# Patient Record
Sex: Female | Born: 1950
Health system: Southern US, Community
[De-identification: ages and names within clinical notes are randomized; demographics above are authoritative.]

## PROBLEM LIST (undated history)

## (undated) DIAGNOSIS — M1990 Unspecified osteoarthritis, unspecified site: Secondary | ICD-10-CM

## (undated) DIAGNOSIS — F411 Generalized anxiety disorder: Secondary | ICD-10-CM

## (undated) DIAGNOSIS — R011 Cardiac murmur, unspecified: Secondary | ICD-10-CM

## (undated) DIAGNOSIS — I10 Essential (primary) hypertension: Secondary | ICD-10-CM

## (undated) DIAGNOSIS — I2699 Other pulmonary embolism without acute cor pulmonale: Secondary | ICD-10-CM

## (undated) DIAGNOSIS — E7800 Pure hypercholesterolemia, unspecified: Secondary | ICD-10-CM

## (undated) DIAGNOSIS — G4700 Insomnia, unspecified: Secondary | ICD-10-CM

## (undated) HISTORY — DX: Insomnia, unspecified: G47.00

## (undated) HISTORY — PX: OTHER SURGICAL HISTORY: SHX169

## (undated) HISTORY — DX: Generalized anxiety disorder: F41.1

## (undated) HISTORY — DX: Other pulmonary embolism without acute cor pulmonale: I26.99

## (undated) HISTORY — DX: Unspecified osteoarthritis, unspecified site: M19.90

---

## 2002-06-01 ENCOUNTER — Other Ambulatory Visit: Admission: RE | Admit: 2002-06-01 | Discharge: 2002-06-01 | Payer: Self-pay | Admitting: Family Medicine

## 2002-12-30 ENCOUNTER — Emergency Department (HOSPITAL_COMMUNITY): Admission: AD | Admit: 2002-12-30 | Discharge: 2002-12-30 | Payer: Self-pay | Admitting: Family Medicine

## 2003-01-05 ENCOUNTER — Emergency Department (HOSPITAL_COMMUNITY): Admission: AD | Admit: 2003-01-05 | Discharge: 2003-01-05 | Payer: Self-pay | Admitting: Family Medicine

## 2003-04-26 ENCOUNTER — Emergency Department (HOSPITAL_COMMUNITY): Admission: EM | Admit: 2003-04-26 | Discharge: 2003-04-26 | Payer: Self-pay | Admitting: Family Medicine

## 2003-11-17 ENCOUNTER — Emergency Department (HOSPITAL_COMMUNITY): Admission: EM | Admit: 2003-11-17 | Discharge: 2003-11-17 | Payer: Self-pay | Admitting: Family Medicine

## 2004-02-24 DIAGNOSIS — I10 Essential (primary) hypertension: Secondary | ICD-10-CM | POA: Insufficient documentation

## 2004-02-25 ENCOUNTER — Ambulatory Visit: Payer: Self-pay | Admitting: Family Medicine

## 2004-03-10 ENCOUNTER — Ambulatory Visit: Payer: Self-pay | Admitting: Family Medicine

## 2004-03-10 DIAGNOSIS — F411 Generalized anxiety disorder: Secondary | ICD-10-CM

## 2004-03-10 DIAGNOSIS — F3289 Other specified depressive episodes: Secondary | ICD-10-CM | POA: Insufficient documentation

## 2004-03-10 DIAGNOSIS — F329 Major depressive disorder, single episode, unspecified: Secondary | ICD-10-CM

## 2004-03-10 HISTORY — DX: Generalized anxiety disorder: F41.1

## 2004-03-13 ENCOUNTER — Ambulatory Visit: Payer: Self-pay | Admitting: *Deleted

## 2004-03-22 ENCOUNTER — Ambulatory Visit (HOSPITAL_COMMUNITY): Admission: RE | Admit: 2004-03-22 | Discharge: 2004-03-22 | Payer: Self-pay | Admitting: Family Medicine

## 2004-04-21 ENCOUNTER — Ambulatory Visit: Payer: Self-pay | Admitting: Family Medicine

## 2004-04-21 DIAGNOSIS — E785 Hyperlipidemia, unspecified: Secondary | ICD-10-CM | POA: Insufficient documentation

## 2004-04-26 ENCOUNTER — Ambulatory Visit: Payer: Self-pay | Admitting: Family Medicine

## 2004-04-26 DIAGNOSIS — G43909 Migraine, unspecified, not intractable, without status migrainosus: Secondary | ICD-10-CM | POA: Insufficient documentation

## 2004-04-28 ENCOUNTER — Ambulatory Visit: Payer: Self-pay | Admitting: Family Medicine

## 2004-05-08 ENCOUNTER — Encounter: Admission: RE | Admit: 2004-05-08 | Discharge: 2004-05-08 | Payer: Self-pay | Admitting: Family Medicine

## 2004-06-16 ENCOUNTER — Ambulatory Visit: Payer: Self-pay | Admitting: Family Medicine

## 2004-06-26 ENCOUNTER — Ambulatory Visit: Payer: Self-pay | Admitting: Family Medicine

## 2004-06-28 ENCOUNTER — Ambulatory Visit: Payer: Self-pay | Admitting: Family Medicine

## 2004-07-21 ENCOUNTER — Ambulatory Visit: Payer: Self-pay | Admitting: Family Medicine

## 2004-07-28 ENCOUNTER — Ambulatory Visit: Payer: Self-pay | Admitting: Family Medicine

## 2004-08-24 ENCOUNTER — Ambulatory Visit: Payer: Self-pay | Admitting: Family Medicine

## 2004-08-24 DIAGNOSIS — J301 Allergic rhinitis due to pollen: Secondary | ICD-10-CM | POA: Insufficient documentation

## 2004-09-04 ENCOUNTER — Ambulatory Visit: Payer: Self-pay | Admitting: *Deleted

## 2004-09-07 ENCOUNTER — Ambulatory Visit: Payer: Self-pay | Admitting: Family Medicine

## 2004-10-05 ENCOUNTER — Ambulatory Visit: Payer: Self-pay | Admitting: Family Medicine

## 2004-10-13 ENCOUNTER — Ambulatory Visit: Payer: Self-pay | Admitting: Family Medicine

## 2004-11-17 ENCOUNTER — Ambulatory Visit: Payer: Self-pay | Admitting: Family Medicine

## 2005-01-19 ENCOUNTER — Ambulatory Visit: Payer: Self-pay | Admitting: Family Medicine

## 2005-03-01 ENCOUNTER — Ambulatory Visit: Payer: Self-pay | Admitting: Family Medicine

## 2005-03-29 ENCOUNTER — Ambulatory Visit: Payer: Self-pay | Admitting: Family Medicine

## 2005-03-29 ENCOUNTER — Encounter: Payer: Self-pay | Admitting: Family Medicine

## 2005-05-08 ENCOUNTER — Ambulatory Visit (HOSPITAL_COMMUNITY): Admission: RE | Admit: 2005-05-08 | Discharge: 2005-05-08 | Payer: Self-pay | Admitting: Internal Medicine

## 2005-06-12 ENCOUNTER — Ambulatory Visit: Payer: Self-pay | Admitting: Family Medicine

## 2005-07-18 ENCOUNTER — Ambulatory Visit: Payer: Self-pay | Admitting: Family Medicine

## 2005-07-27 ENCOUNTER — Ambulatory Visit: Payer: Self-pay | Admitting: Family Medicine

## 2005-07-30 ENCOUNTER — Ambulatory Visit: Payer: Self-pay | Admitting: Family Medicine

## 2005-09-14 ENCOUNTER — Ambulatory Visit: Payer: Self-pay | Admitting: Family Medicine

## 2005-09-14 DIAGNOSIS — F1910 Other psychoactive substance abuse, uncomplicated: Secondary | ICD-10-CM

## 2005-10-04 DIAGNOSIS — F609 Personality disorder, unspecified: Secondary | ICD-10-CM | POA: Insufficient documentation

## 2005-10-19 ENCOUNTER — Ambulatory Visit: Payer: Self-pay | Admitting: Family Medicine

## 2005-10-25 ENCOUNTER — Ambulatory Visit: Payer: Self-pay | Admitting: Family Medicine

## 2005-11-14 ENCOUNTER — Ambulatory Visit (HOSPITAL_COMMUNITY): Admission: RE | Admit: 2005-11-14 | Discharge: 2005-11-14 | Payer: Self-pay | Admitting: Family Medicine

## 2006-01-14 ENCOUNTER — Ambulatory Visit: Payer: Self-pay | Admitting: Family Medicine

## 2006-03-21 ENCOUNTER — Ambulatory Visit: Payer: Self-pay | Admitting: Family Medicine

## 2006-04-16 ENCOUNTER — Ambulatory Visit: Payer: Self-pay | Admitting: Family Medicine

## 2006-05-06 ENCOUNTER — Ambulatory Visit: Payer: Self-pay | Admitting: Family Medicine

## 2006-05-06 DIAGNOSIS — R9431 Abnormal electrocardiogram [ECG] [EKG]: Secondary | ICD-10-CM

## 2006-05-09 ENCOUNTER — Ambulatory Visit (HOSPITAL_COMMUNITY): Admission: RE | Admit: 2006-05-09 | Discharge: 2006-05-09 | Payer: Self-pay | Admitting: Internal Medicine

## 2006-07-08 ENCOUNTER — Ambulatory Visit: Payer: Self-pay | Admitting: Family Medicine

## 2006-08-13 ENCOUNTER — Ambulatory Visit: Payer: Self-pay | Admitting: Family Medicine

## 2006-09-09 ENCOUNTER — Ambulatory Visit: Payer: Self-pay | Admitting: Family Medicine

## 2006-09-17 DIAGNOSIS — Z9189 Other specified personal risk factors, not elsewhere classified: Secondary | ICD-10-CM

## 2006-09-17 DIAGNOSIS — Z9889 Other specified postprocedural states: Secondary | ICD-10-CM | POA: Insufficient documentation

## 2006-10-13 ENCOUNTER — Emergency Department (HOSPITAL_COMMUNITY): Admission: EM | Admit: 2006-10-13 | Discharge: 2006-10-13 | Payer: Self-pay | Admitting: Emergency Medicine

## 2006-10-15 ENCOUNTER — Ambulatory Visit: Payer: Self-pay | Admitting: Family Medicine

## 2006-11-27 ENCOUNTER — Encounter (INDEPENDENT_AMBULATORY_CARE_PROVIDER_SITE_OTHER): Payer: Self-pay | Admitting: *Deleted

## 2006-12-19 ENCOUNTER — Telehealth (INDEPENDENT_AMBULATORY_CARE_PROVIDER_SITE_OTHER): Payer: Self-pay | Admitting: *Deleted

## 2006-12-27 ENCOUNTER — Encounter (INDEPENDENT_AMBULATORY_CARE_PROVIDER_SITE_OTHER): Payer: Self-pay | Admitting: *Deleted

## 2007-02-27 ENCOUNTER — Emergency Department (HOSPITAL_COMMUNITY): Admission: EM | Admit: 2007-02-27 | Discharge: 2007-02-27 | Payer: Self-pay | Admitting: Family Medicine

## 2007-03-24 ENCOUNTER — Emergency Department (HOSPITAL_COMMUNITY): Admission: EM | Admit: 2007-03-24 | Discharge: 2007-03-24 | Payer: Self-pay | Admitting: Emergency Medicine

## 2007-04-02 ENCOUNTER — Ambulatory Visit: Payer: Self-pay | Admitting: Family Medicine

## 2007-04-02 DIAGNOSIS — H8309 Labyrinthitis, unspecified ear: Secondary | ICD-10-CM

## 2007-05-07 ENCOUNTER — Telehealth (INDEPENDENT_AMBULATORY_CARE_PROVIDER_SITE_OTHER): Payer: Self-pay | Admitting: *Deleted

## 2007-07-09 ENCOUNTER — Telehealth (INDEPENDENT_AMBULATORY_CARE_PROVIDER_SITE_OTHER): Payer: Self-pay | Admitting: Family Medicine

## 2007-07-28 ENCOUNTER — Ambulatory Visit: Payer: Self-pay | Admitting: Family Medicine

## 2007-07-28 LAB — CONVERTED CEMR LAB
Bilirubin Urine: NEGATIVE
Blood in Urine, dipstick: NEGATIVE
Chlamydia, DNA Probe: NEGATIVE
GC Probe Amp, Genital: NEGATIVE
Glucose, Urine, Semiquant: NEGATIVE
Ketones, urine, test strip: NEGATIVE
Nitrite: NEGATIVE
Pap Smear: NORMAL
Protein, U semiquant: NEGATIVE
Specific Gravity, Urine: 1.01
Urobilinogen, UA: NEGATIVE
WBC Urine, dipstick: NEGATIVE
pH: 6.5

## 2007-07-29 ENCOUNTER — Ambulatory Visit: Payer: Self-pay | Admitting: Family Medicine

## 2007-07-30 ENCOUNTER — Encounter (INDEPENDENT_AMBULATORY_CARE_PROVIDER_SITE_OTHER): Payer: Self-pay | Admitting: Family Medicine

## 2007-08-01 ENCOUNTER — Ambulatory Visit (HOSPITAL_COMMUNITY): Admission: RE | Admit: 2007-08-01 | Discharge: 2007-08-01 | Payer: Self-pay | Admitting: Family Medicine

## 2007-08-08 LAB — CONVERTED CEMR LAB
ALT: 21 units/L (ref 0–35)
AST: 19 units/L (ref 0–37)
Albumin: 4.5 g/dL (ref 3.5–5.2)
Alkaline Phosphatase: 93 units/L (ref 39–117)
BUN: 14 mg/dL (ref 6–23)
Basophils Absolute: 0 10*3/uL (ref 0.0–0.1)
Basophils Relative: 0 % (ref 0–1)
CO2: 24 meq/L (ref 19–32)
Calcium: 9.6 mg/dL (ref 8.4–10.5)
Chloride: 105 meq/L (ref 96–112)
Cholesterol: 206 mg/dL — ABNORMAL HIGH (ref 0–200)
Creatinine, Ser: 0.9 mg/dL (ref 0.40–1.20)
Eosinophils Absolute: 0.2 10*3/uL (ref 0.0–0.7)
Eosinophils Relative: 6 % — ABNORMAL HIGH (ref 0–5)
Glucose, Bld: 110 mg/dL — ABNORMAL HIGH (ref 70–99)
HCT: 41.6 % (ref 36.0–46.0)
HDL: 51 mg/dL (ref >39)
Hemoglobin: 13.4 g/dL (ref 12.0–15.0)
LDL Cholesterol: 126 mg/dL — ABNORMAL HIGH (ref 0–99)
Lymphocytes Relative: 27 % (ref 12–46)
Lymphs Abs: 0.9 10*3/uL (ref 0.7–4.0)
MCHC: 32.2 g/dL (ref 30.0–36.0)
MCV: 87.6 fL (ref 78.0–100.0)
Monocytes Absolute: 0.3 10*3/uL (ref 0.1–1.0)
Monocytes Relative: 8 % (ref 3–12)
Neutro Abs: 2 10*3/uL (ref 1.7–7.7)
Neutrophils Relative %: 59 % (ref 43–77)
Platelets: 252 10*3/uL (ref 150–400)
Potassium: 3.9 meq/L (ref 3.5–5.3)
RBC: 4.75 M/uL (ref 3.87–5.11)
RDW: 13 % (ref 11.5–15.5)
Sodium: 141 meq/L (ref 135–145)
Total Bilirubin: 1.1 mg/dL (ref 0.3–1.2)
Total CHOL/HDL Ratio: 4
Total Protein: 7.8 g/dL (ref 6.0–8.3)
Triglycerides: 143 mg/dL (ref <150)
VLDL: 29 mg/dL (ref 0–40)
WBC: 3.5 10*3/uL — ABNORMAL LOW (ref 4.0–10.5)

## 2007-08-12 ENCOUNTER — Encounter (INDEPENDENT_AMBULATORY_CARE_PROVIDER_SITE_OTHER): Payer: Self-pay | Admitting: Family Medicine

## 2007-08-28 ENCOUNTER — Ambulatory Visit: Payer: Self-pay | Admitting: *Deleted

## 2007-08-28 ENCOUNTER — Ambulatory Visit: Payer: Self-pay | Admitting: Nurse Practitioner

## 2007-08-28 DIAGNOSIS — J329 Chronic sinusitis, unspecified: Secondary | ICD-10-CM | POA: Insufficient documentation

## 2007-08-28 LAB — CONVERTED CEMR LAB
Basophils Absolute: 0 10*3/uL (ref 0.0–0.1)
Basophils Relative: 1 % (ref 0–1)
Eosinophils Absolute: 0.1 10*3/uL (ref 0.0–0.7)
Eosinophils Relative: 3 % (ref 0–5)
HCT: 40.9 % (ref 36.0–46.0)
Hemoglobin: 13.6 g/dL (ref 12.0–15.0)
Lymphocytes Relative: 36 % (ref 12–46)
Lymphs Abs: 1.4 10*3/uL (ref 0.7–4.0)
MCHC: 33.3 g/dL (ref 30.0–36.0)
MCV: 84.9 fL (ref 78.0–100.0)
Monocytes Absolute: 0.4 10*3/uL (ref 0.1–1.0)
Monocytes Relative: 11 % (ref 3–12)
Neutro Abs: 1.8 10*3/uL (ref 1.7–7.7)
Neutrophils Relative %: 48 % (ref 43–77)
Platelets: 250 10*3/uL (ref 150–400)
RBC: 4.82 M/uL (ref 3.87–5.11)
RDW: 13 % (ref 11.5–15.5)
WBC: 3.8 10*3/uL — ABNORMAL LOW (ref 4.0–10.5)

## 2007-09-29 ENCOUNTER — Ambulatory Visit: Payer: Self-pay | Admitting: Family Medicine

## 2007-10-02 ENCOUNTER — Telehealth (INDEPENDENT_AMBULATORY_CARE_PROVIDER_SITE_OTHER): Payer: Self-pay | Admitting: *Deleted

## 2007-10-02 LAB — CONVERTED CEMR LAB
ALT: 28 units/L (ref 0–35)
AST: 28 units/L (ref 0–37)
Albumin: 4.6 g/dL (ref 3.5–5.2)
Alkaline Phosphatase: 90 units/L (ref 39–117)
BUN: 15 mg/dL (ref 6–23)
Basophils Absolute: 0 10*3/uL (ref 0.0–0.1)
Basophils Relative: 1 % (ref 0–1)
CO2: 22 meq/L (ref 19–32)
Calcium: 9.9 mg/dL (ref 8.4–10.5)
Chloride: 106 meq/L (ref 96–112)
Cholesterol: 196 mg/dL (ref 0–200)
Creatinine, Ser: 0.72 mg/dL (ref 0.40–1.20)
Eosinophils Absolute: 0.2 10*3/uL (ref 0.0–0.7)
Eosinophils Relative: 6 % — ABNORMAL HIGH (ref 0–5)
Glucose, Bld: 111 mg/dL — ABNORMAL HIGH (ref 70–99)
HCT: 39.7 % (ref 36.0–46.0)
HDL: 53 mg/dL (ref >39)
Hemoglobin: 13.1 g/dL (ref 12.0–15.0)
Hgb A1c MFr Bld: 5.5 % (ref 4.6–6.1)
LDL Cholesterol: 117 mg/dL — ABNORMAL HIGH (ref 0–99)
Lymphocytes Relative: 28 % (ref 12–46)
Lymphs Abs: 1 10*3/uL (ref 0.7–4.0)
MCHC: 33 g/dL (ref 30.0–36.0)
MCV: 85.4 fL (ref 78.0–100.0)
Monocytes Absolute: 0.3 10*3/uL (ref 0.1–1.0)
Monocytes Relative: 9 % (ref 3–12)
Neutro Abs: 2 10*3/uL (ref 1.7–7.7)
Neutrophils Relative %: 56 % (ref 43–77)
Platelets: 248 10*3/uL (ref 150–400)
Potassium: 4.7 meq/L (ref 3.5–5.3)
RBC: 4.65 M/uL (ref 3.87–5.11)
RDW: 13.6 % (ref 11.5–15.5)
Sodium: 143 meq/L (ref 135–145)
Total Bilirubin: 0.9 mg/dL (ref 0.3–1.2)
Total CHOL/HDL Ratio: 3.7
Total Protein: 7.7 g/dL (ref 6.0–8.3)
Triglycerides: 131 mg/dL (ref <150)
VLDL: 26 mg/dL (ref 0–40)
WBC: 3.5 10*3/uL — ABNORMAL LOW (ref 4.0–10.5)

## 2007-10-17 ENCOUNTER — Ambulatory Visit: Payer: Self-pay | Admitting: Family Medicine

## 2007-10-20 ENCOUNTER — Ambulatory Visit: Payer: Self-pay | Admitting: Family Medicine

## 2007-10-21 ENCOUNTER — Ambulatory Visit: Payer: Self-pay | Admitting: Family Medicine

## 2007-10-21 DIAGNOSIS — H8110 Benign paroxysmal vertigo, unspecified ear: Secondary | ICD-10-CM

## 2007-10-21 DIAGNOSIS — H6120 Impacted cerumen, unspecified ear: Secondary | ICD-10-CM

## 2008-01-31 ENCOUNTER — Emergency Department (HOSPITAL_COMMUNITY): Admission: EM | Admit: 2008-01-31 | Discharge: 2008-01-31 | Payer: Self-pay | Admitting: Emergency Medicine

## 2008-02-02 ENCOUNTER — Telehealth (INDEPENDENT_AMBULATORY_CARE_PROVIDER_SITE_OTHER): Payer: Self-pay | Admitting: *Deleted

## 2008-02-03 ENCOUNTER — Ambulatory Visit: Payer: Self-pay | Admitting: Nurse Practitioner

## 2008-04-20 ENCOUNTER — Ambulatory Visit (HOSPITAL_COMMUNITY): Admission: RE | Admit: 2008-04-20 | Discharge: 2008-04-20 | Payer: Self-pay | Admitting: Family Medicine

## 2008-04-20 ENCOUNTER — Ambulatory Visit: Payer: Self-pay | Admitting: Family Medicine

## 2008-04-20 DIAGNOSIS — M25512 Pain in left shoulder: Secondary | ICD-10-CM | POA: Insufficient documentation

## 2008-04-23 ENCOUNTER — Encounter (INDEPENDENT_AMBULATORY_CARE_PROVIDER_SITE_OTHER): Payer: Self-pay | Admitting: Family Medicine

## 2008-08-06 ENCOUNTER — Ambulatory Visit: Payer: Self-pay | Admitting: Nurse Practitioner

## 2008-08-06 DIAGNOSIS — M940 Chondrocostal junction syndrome [Tietze]: Secondary | ICD-10-CM | POA: Insufficient documentation

## 2008-08-11 ENCOUNTER — Ambulatory Visit (HOSPITAL_COMMUNITY): Admission: RE | Admit: 2008-08-11 | Discharge: 2008-08-11 | Payer: Self-pay | Admitting: Family Medicine

## 2008-08-31 ENCOUNTER — Encounter (INDEPENDENT_AMBULATORY_CARE_PROVIDER_SITE_OTHER): Payer: Self-pay | Admitting: Nurse Practitioner

## 2008-08-31 ENCOUNTER — Ambulatory Visit: Payer: Self-pay | Admitting: Nurse Practitioner

## 2008-08-31 LAB — CONVERTED CEMR LAB
Bilirubin Urine: NEGATIVE
Blood in Urine, dipstick: NEGATIVE
Glucose, Urine, Semiquant: NEGATIVE
KOH Prep: NEGATIVE
Ketones, urine, test strip: NEGATIVE
Nitrite: NEGATIVE
Specific Gravity, Urine: 1.01
Urobilinogen, UA: 1
pH: 7

## 2008-09-01 ENCOUNTER — Encounter (INDEPENDENT_AMBULATORY_CARE_PROVIDER_SITE_OTHER): Payer: Self-pay | Admitting: Nurse Practitioner

## 2008-09-01 LAB — CONVERTED CEMR LAB
ALT: 28 units/L (ref 0–35)
AST: 30 units/L (ref 0–37)
Albumin: 4.2 g/dL (ref 3.5–5.2)
Alkaline Phosphatase: 79 units/L (ref 39–117)
BUN: 11 mg/dL (ref 6–23)
Basophils Absolute: 0 10*3/uL (ref 0.0–0.1)
Basophils Relative: 1 % (ref 0–1)
CO2: 25 meq/L (ref 19–32)
Calcium: 9.3 mg/dL (ref 8.4–10.5)
Chlamydia, DNA Probe: NEGATIVE
Chloride: 107 meq/L (ref 96–112)
Cholesterol: 276 mg/dL — ABNORMAL HIGH (ref 0–200)
Creatinine, Ser: 0.84 mg/dL (ref 0.40–1.20)
Eosinophils Absolute: 0.2 10*3/uL (ref 0.0–0.7)
Eosinophils Relative: 5 % (ref 0–5)
GC Probe Amp, Genital: NEGATIVE
Glucose, Bld: 94 mg/dL (ref 70–99)
HCT: 38.7 % (ref 36.0–46.0)
HDL: 52 mg/dL (ref >39)
Hemoglobin: 13.3 g/dL (ref 12.0–15.0)
LDL Cholesterol: 185 mg/dL — ABNORMAL HIGH (ref 0–99)
Lymphocytes Relative: 26 % (ref 12–46)
Lymphs Abs: 1 10*3/uL (ref 0.7–4.0)
MCHC: 34.4 g/dL (ref 30.0–36.0)
MCV: 84.7 fL (ref 78.0–100.0)
Microalb, Ur: 0.76 mg/dL (ref 0.00–1.89)
Monocytes Absolute: 0.3 10*3/uL (ref 0.1–1.0)
Monocytes Relative: 9 % (ref 3–12)
Neutro Abs: 2.4 10*3/uL (ref 1.7–7.7)
Neutrophils Relative %: 60 % (ref 43–77)
Platelets: 234 10*3/uL (ref 150–400)
Potassium: 4 meq/L (ref 3.5–5.3)
RBC: 4.57 M/uL (ref 3.87–5.11)
RDW: 13.5 % (ref 11.5–15.5)
Sodium: 143 meq/L (ref 135–145)
TSH: 0.815 microintl units/mL (ref 0.350–4.500)
Total Bilirubin: 1 mg/dL (ref 0.3–1.2)
Total CHOL/HDL Ratio: 5.3
Total Protein: 7.4 g/dL (ref 6.0–8.3)
Triglycerides: 194 mg/dL — ABNORMAL HIGH (ref <150)
VLDL: 39 mg/dL (ref 0–40)
WBC: 4 10*3/uL (ref 4.0–10.5)

## 2008-09-20 ENCOUNTER — Ambulatory Visit: Payer: Self-pay | Admitting: Nurse Practitioner

## 2008-10-26 ENCOUNTER — Ambulatory Visit: Payer: Self-pay | Admitting: Internal Medicine

## 2008-11-03 ENCOUNTER — Ambulatory Visit: Payer: Self-pay | Admitting: Nurse Practitioner

## 2008-11-03 LAB — CONVERTED CEMR LAB
Cholesterol, target level: 200 mg/dL
HDL goal, serum: 40 mg/dL
LDL Goal: 130 mg/dL

## 2008-11-10 ENCOUNTER — Ambulatory Visit: Payer: Self-pay | Admitting: Nurse Practitioner

## 2008-11-10 LAB — CONVERTED CEMR LAB
ALT: 30 units/L (ref 0–35)
AST: 31 units/L (ref 0–37)
Albumin: 4.4 g/dL (ref 3.5–5.2)
Alkaline Phosphatase: 96 units/L (ref 39–117)
Bilirubin, Direct: 0.2 mg/dL (ref 0.0–0.3)
Cholesterol: 188 mg/dL (ref 0–200)
HDL: 46 mg/dL (ref >39)
Indirect Bilirubin: 0.9 mg/dL (ref 0.0–0.9)
LDL Cholesterol: 122 mg/dL — ABNORMAL HIGH (ref 0–99)
Total Bilirubin: 1.1 mg/dL (ref 0.3–1.2)
Total CHOL/HDL Ratio: 4.1
Total Protein: 7.6 g/dL (ref 6.0–8.3)
Triglycerides: 98 mg/dL (ref <150)
VLDL: 20 mg/dL (ref 0–40)

## 2008-11-12 ENCOUNTER — Encounter (INDEPENDENT_AMBULATORY_CARE_PROVIDER_SITE_OTHER): Payer: Self-pay | Admitting: Nurse Practitioner

## 2008-12-03 ENCOUNTER — Encounter (INDEPENDENT_AMBULATORY_CARE_PROVIDER_SITE_OTHER): Payer: Self-pay | Admitting: Nurse Practitioner

## 2008-12-08 ENCOUNTER — Telehealth (INDEPENDENT_AMBULATORY_CARE_PROVIDER_SITE_OTHER): Payer: Self-pay | Admitting: Nurse Practitioner

## 2008-12-16 ENCOUNTER — Ambulatory Visit: Payer: Self-pay | Admitting: Nurse Practitioner

## 2008-12-29 ENCOUNTER — Encounter (INDEPENDENT_AMBULATORY_CARE_PROVIDER_SITE_OTHER): Payer: Self-pay | Admitting: Nurse Practitioner

## 2008-12-31 ENCOUNTER — Encounter: Admission: RE | Admit: 2008-12-31 | Discharge: 2009-03-10 | Payer: Self-pay | Admitting: Internal Medicine

## 2008-12-31 ENCOUNTER — Encounter (INDEPENDENT_AMBULATORY_CARE_PROVIDER_SITE_OTHER): Payer: Self-pay | Admitting: Nurse Practitioner

## 2009-01-10 ENCOUNTER — Telehealth (INDEPENDENT_AMBULATORY_CARE_PROVIDER_SITE_OTHER): Payer: Self-pay | Admitting: *Deleted

## 2009-01-11 ENCOUNTER — Encounter (INDEPENDENT_AMBULATORY_CARE_PROVIDER_SITE_OTHER): Payer: Self-pay | Admitting: Nurse Practitioner

## 2009-02-16 ENCOUNTER — Telehealth (INDEPENDENT_AMBULATORY_CARE_PROVIDER_SITE_OTHER): Payer: Self-pay | Admitting: Nurse Practitioner

## 2009-02-21 ENCOUNTER — Encounter (INDEPENDENT_AMBULATORY_CARE_PROVIDER_SITE_OTHER): Payer: Self-pay | Admitting: Nurse Practitioner

## 2009-03-09 ENCOUNTER — Encounter (INDEPENDENT_AMBULATORY_CARE_PROVIDER_SITE_OTHER): Payer: Self-pay | Admitting: Nurse Practitioner

## 2009-03-14 ENCOUNTER — Telehealth (INDEPENDENT_AMBULATORY_CARE_PROVIDER_SITE_OTHER): Payer: Self-pay | Admitting: Nurse Practitioner

## 2009-03-14 DIAGNOSIS — H9319 Tinnitus, unspecified ear: Secondary | ICD-10-CM | POA: Insufficient documentation

## 2009-04-20 ENCOUNTER — Ambulatory Visit: Payer: Self-pay | Admitting: Nurse Practitioner

## 2009-04-21 ENCOUNTER — Telehealth (INDEPENDENT_AMBULATORY_CARE_PROVIDER_SITE_OTHER): Payer: Self-pay | Admitting: *Deleted

## 2009-05-20 ENCOUNTER — Ambulatory Visit: Payer: Self-pay | Admitting: Nurse Practitioner

## 2009-05-20 DIAGNOSIS — M79609 Pain in unspecified limb: Secondary | ICD-10-CM | POA: Insufficient documentation

## 2009-06-16 ENCOUNTER — Ambulatory Visit: Payer: Self-pay | Admitting: Nurse Practitioner

## 2009-06-16 DIAGNOSIS — R10812 Left upper quadrant abdominal tenderness: Secondary | ICD-10-CM

## 2009-06-17 ENCOUNTER — Telehealth (INDEPENDENT_AMBULATORY_CARE_PROVIDER_SITE_OTHER): Payer: Self-pay | Admitting: *Deleted

## 2009-06-22 ENCOUNTER — Telehealth (INDEPENDENT_AMBULATORY_CARE_PROVIDER_SITE_OTHER): Payer: Self-pay | Admitting: Nurse Practitioner

## 2009-08-01 ENCOUNTER — Emergency Department (HOSPITAL_COMMUNITY): Admission: EM | Admit: 2009-08-01 | Discharge: 2009-08-01 | Payer: Self-pay | Admitting: Family Medicine

## 2009-08-15 ENCOUNTER — Ambulatory Visit: Payer: Self-pay | Admitting: Nurse Practitioner

## 2009-08-16 ENCOUNTER — Ambulatory Visit (HOSPITAL_COMMUNITY): Admission: RE | Admit: 2009-08-16 | Discharge: 2009-08-16 | Payer: Self-pay | Admitting: Internal Medicine

## 2009-08-17 ENCOUNTER — Ambulatory Visit: Payer: Self-pay | Admitting: Nurse Practitioner

## 2009-08-31 ENCOUNTER — Ambulatory Visit: Payer: Self-pay | Admitting: Nurse Practitioner

## 2009-08-31 DIAGNOSIS — T148 Other injury of unspecified body region: Secondary | ICD-10-CM

## 2009-08-31 DIAGNOSIS — W57XXXA Bitten or stung by nonvenomous insect and other nonvenomous arthropods, initial encounter: Secondary | ICD-10-CM

## 2009-09-01 ENCOUNTER — Encounter (INDEPENDENT_AMBULATORY_CARE_PROVIDER_SITE_OTHER): Payer: Self-pay | Admitting: Nurse Practitioner

## 2009-12-01 ENCOUNTER — Ambulatory Visit: Payer: Self-pay | Admitting: Nurse Practitioner

## 2009-12-01 DIAGNOSIS — M25569 Pain in unspecified knee: Secondary | ICD-10-CM | POA: Insufficient documentation

## 2009-12-12 ENCOUNTER — Ambulatory Visit: Payer: Self-pay | Admitting: Nurse Practitioner

## 2009-12-15 ENCOUNTER — Encounter (INDEPENDENT_AMBULATORY_CARE_PROVIDER_SITE_OTHER): Payer: Self-pay | Admitting: Nurse Practitioner

## 2009-12-27 ENCOUNTER — Encounter (INDEPENDENT_AMBULATORY_CARE_PROVIDER_SITE_OTHER): Payer: Self-pay | Admitting: Nurse Practitioner

## 2009-12-27 DIAGNOSIS — H905 Unspecified sensorineural hearing loss: Secondary | ICD-10-CM | POA: Insufficient documentation

## 2010-01-10 ENCOUNTER — Encounter (INDEPENDENT_AMBULATORY_CARE_PROVIDER_SITE_OTHER): Payer: Self-pay | Admitting: Nurse Practitioner

## 2010-01-18 ENCOUNTER — Telehealth (INDEPENDENT_AMBULATORY_CARE_PROVIDER_SITE_OTHER): Payer: Self-pay | Admitting: Nurse Practitioner

## 2010-01-25 ENCOUNTER — Telehealth (INDEPENDENT_AMBULATORY_CARE_PROVIDER_SITE_OTHER): Payer: Self-pay | Admitting: Nurse Practitioner

## 2010-01-25 ENCOUNTER — Ambulatory Visit: Payer: Self-pay | Admitting: Nurse Practitioner

## 2010-02-06 ENCOUNTER — Encounter (INDEPENDENT_AMBULATORY_CARE_PROVIDER_SITE_OTHER): Payer: Self-pay | Admitting: Nurse Practitioner

## 2010-02-06 ENCOUNTER — Emergency Department (HOSPITAL_COMMUNITY)
Admission: EM | Admit: 2010-02-06 | Discharge: 2010-02-06 | Payer: Self-pay | Source: Home / Self Care | Admitting: Emergency Medicine

## 2010-04-02 ENCOUNTER — Encounter: Payer: Self-pay | Admitting: Family Medicine

## 2010-04-04 ENCOUNTER — Emergency Department (HOSPITAL_COMMUNITY)
Admission: EM | Admit: 2010-04-04 | Discharge: 2010-04-04 | Payer: Self-pay | Source: Home / Self Care | Admitting: Family Medicine

## 2010-04-09 LAB — CONVERTED CEMR LAB
ALT: 18 units/L (ref 0–35)
AST: 17 units/L (ref 0–37)
Albumin: 4.5 g/dL (ref 3.5–5.2)
Alkaline Phosphatase: 80 units/L (ref 39–117)
BUN: 11 mg/dL (ref 6–23)
Basophils Absolute: 0 10*3/uL (ref 0.0–0.1)
Basophils Relative: 1 % (ref 0–1)
Bilirubin Urine: NEGATIVE
Blood in Urine, dipstick: NEGATIVE
CO2: 27 meq/L (ref 19–32)
Calcium: 9.9 mg/dL (ref 8.4–10.5)
Chlamydia, DNA Probe: NEGATIVE
Chloride: 101 meq/L (ref 96–112)
Creatinine, Ser: 0.81 mg/dL (ref 0.40–1.20)
Eosinophils Absolute: 0.2 10*3/uL (ref 0.0–0.7)
Eosinophils Relative: 5 % (ref 0–5)
GC Probe Amp, Genital: NEGATIVE
Glucose, Bld: 84 mg/dL (ref 70–99)
Glucose, Urine, Semiquant: NEGATIVE
HCT: 37.7 % (ref 36.0–46.0)
Hemoglobin: 13.1 g/dL (ref 12.0–15.0)
KOH Prep: NEGATIVE
Ketones, urine, test strip: NEGATIVE
Lymphocytes Relative: 32 % (ref 12–46)
Lymphs Abs: 1.4 10*3/uL (ref 0.7–4.0)
MCHC: 34.7 g/dL (ref 30.0–36.0)
MCV: 84.2 fL (ref 78.0–100.0)
Microalb, Ur: 0.68 mg/dL (ref 0.00–1.89)
Monocytes Absolute: 0.4 10*3/uL (ref 0.1–1.0)
Monocytes Relative: 8 % (ref 3–12)
Neutro Abs: 2.3 10*3/uL (ref 1.7–7.7)
Neutrophils Relative %: 53 % (ref 43–77)
Nitrite: NEGATIVE
OCCULT 1: NEGATIVE
Pap Smear: NEGATIVE
Platelets: 261 10*3/uL (ref 150–400)
Potassium: 3.9 meq/L (ref 3.5–5.3)
Protein, U semiquant: NEGATIVE
RBC: 4.48 M/uL (ref 3.87–5.11)
RDW: 13 % (ref 11.5–15.5)
Sodium: 140 meq/L (ref 135–145)
Specific Gravity, Urine: 1.01
TSH: 1.159 microintl units/mL (ref 0.350–4.500)
Total Bilirubin: 1 mg/dL (ref 0.3–1.2)
Total Protein: 7.4 g/dL (ref 6.0–8.3)
Urobilinogen, UA: 0.2
WBC: 4.2 10*3/uL (ref 4.0–10.5)
pH: 6

## 2010-04-11 ENCOUNTER — Telehealth (INDEPENDENT_AMBULATORY_CARE_PROVIDER_SITE_OTHER): Payer: Self-pay | Admitting: Nurse Practitioner

## 2010-04-13 NOTE — Letter (Signed)
Summary: CLINIC NOTE//OTOLARYNGOLOGY  CLINIC NOTE//OTOLARYNGOLOGY   Imported By: Arta Bruce 01/12/2010 15:00:24  _____________________________________________________________________  External Attachment:    Type:   Image     Comment:   External Document

## 2010-04-13 NOTE — Miscellaneous (Signed)
Summary: New Dx added  Clinical Lists Changes  Problems: Added new problem of HEARING LOSS, SENSORINEURAL, BILATERAL (ICD-389.10) - Audiology evaluation done at [facility] on 12/15/2009 - rec binaural amplification

## 2010-04-13 NOTE — Letter (Signed)
Summary: AUDIOLOGY EVAL  AUDIOLOGY EVAL   Imported By: Arta Bruce 12/21/2009 14:31:22  _____________________________________________________________________  External Attachment:    Type:   Image     Comment:   External Document

## 2010-04-13 NOTE — Letter (Signed)
Summary: *HSN Results Follow up  [facility]  [address] [place], [place] [address]   Phone: [phone]  Fax: [phone]      09/01/2009   BERNARDA ERCK [address] [place], [place]  [address]   Dear  Ms. Devani Muscato,                            ____S.Drinkard,FNP   ____D. Gore,FNP       ____B. McPherson,MD   ____V. Rankins,MD    ____E. Mulberry,MD    __X__N. Daphine Deutscher, FNP  ____D. Reche Dixon, MD    ____K. Philipp Deputy, MD    ____Other     This letter is to inform you that your recent test(s):  __X_____Pap Smear    ___X____Lab Test     _______X-ray    ___X____ is within acceptable limits  _______ requires a medication change  _______ requires a follow-up lab visit  _______ requires a follow-up visit with your provider   Comments: Labs done during recent office visit are normal.  Pap Smear results _______________________________.       _________________________________________________________ If you have any questions, please contact our office [phone].                    Sincerely,    Lehman Prom FNP [facility]

## 2010-04-13 NOTE — Progress Notes (Signed)
Summary: ENT Referral follow up  Phone Note From Other Clinic   Caller: Referral Coordinator Summary of Call: Pt was supposed to get in contact with [facility] @ [facility] before an appt was sched because of the huge $$$$..Pt was to get back in contact with me to let me know if she qualified for assistance. Initial call taken by: Candi Leash,  April 21, 2009 1:00 PM  Follow-up for Phone Call        I'm assuming there are no local ENT's accepting HS pts.  I remember some pts going to ENT across from the [facility] but don't know if they require a co-payment or those circumstances Follow-up by: Lehman Prom FNP,  April 21, 2009 1:05 PM  Additional Follow-up for Phone Call Additional follow up Details #1::        Yes [facility] requires 100.00 up front and an payment plan is made.Pt is aware of this and was asked to be sent somewhere less costly.    Additional Follow-up for Phone Call Additional follow up Details #2::    Steward Drone, contact the pt and inform her of the orginal message from [place].  It appears she needs to see if she qualifies for [facility] n.martin,fnp April 21, 2009  5:52 PM  LMOM>. Chantel Miller  April 28, 2009 12:41 PM  pt given information and phone numbers to contact [facility] and [facility]. Follow-up by: Michelle Nasuti,  April 29, 2009 8:59 AM

## 2010-04-13 NOTE — Progress Notes (Signed)
Summary: [facility] update  Phone Note From Other Clinic   Caller: Referral Coordinator Summary of Call: Pt  has been sched @ [facility]  on  07-26-09 @ 9:15am for Vestibular, @ 11:30am for Hearing & ENT on  08-15-09 @ 1pm. Pt should be able to contact [facility] @ [phone].I lt message for Pt to call me back for appt infor. Initial call taken by: Candi Leash,  June 22, 2009 10:56 AM  Follow-up for Phone Call        noted Follow-up by: Lehman Prom FNP,  June 22, 2009 1:15 PM

## 2010-04-13 NOTE — Progress Notes (Signed)
Summary: Wants to discuss hearing test results  Phone Note Call from Patient   Summary of Call: Would like to discuss with you the results of her hearing tests and hearing evaluation. Initial call taken by: Dutch Quint RN,  January 25, 2010 2:40 PM  Follow-up for Phone Call        attempted to call home # - busy called work # - she was not there yet If/when she calls back find out what she wants me to know regarding her hearing exam. i will not have time during the day to return calls Follow-up by: Lehman Prom FNP,  January 27, 2010 8:04 AM  Additional Follow-up for Phone Call Additional follow up Details #1::        no answer Gaylyn Cheers RN  January 27, 2010 3:45 PM  How the test turned out and the results of the exam itself.  No other specifics given.  Dutch Quint RN  January 31, 2010 11:59 AM     Additional Follow-up for Phone Call Additional follow up Details #2::    attempted to call pt again - no anwer unable to leave a message - phone just keeps ringing will send a letter notifying pt of my attempt  Follow-up by: Lehman Prom FNP,  February 06, 2010 8:37 AM

## 2010-04-13 NOTE — Assessment & Plan Note (Signed)
Summary: LUQ tenderness   Vital Signs:  Patient profile:   60 year old female Menstrual status:  postmenopausal Weight:      182 pounds BMI:     30.87 Temp:     98.4 degrees F Pulse rate:   85 / minute Pulse rhythm:   regular Resp:     20 per minute BP sitting:   133 / 85  (left arm) Cuff size:   regular  Vitals Entered By: Vesta Mixer CMA (June 16, 2009 2:20 PM) CC: Sore under left breast and has a knot under it also.  First noticed about 3 weeks ago.  Last mammogram around May of last year., Hypertension Management Is Patient Diabetic? No Pain Assessment Patient in pain? yes     Location: under left breast Intensity: 7  Does patient need assistance? Ambulation Normal   Primary Care Provider:  Lehman Prom FNP  CC:  Sore under left breast and has a knot under it also.  First noticed about 3 weeks ago.  Last mammogram around May of last year. and Hypertension Management.  History of Present Illness:  Pt into the office with complaint of left breast lump noticed about 3 week ago. ? 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  Pt has been to the physical therapy without resolution of symptoms.   She is continuing to take her allergy medications.   Hypertension History:      She denies headache, chest pain, and palpitations.  She notes no problems with any antihypertensive medication side effects.        Positive major cardiovascular risk factors include female age 97 years old or older, hyperlipidemia, and hypertension.  Negative major cardiovascular risk factors include no history of diabetes and non-tobacco-user status.        Further assessment for target organ damage reveals no history of ASHD, cardiac end-organ damage (CHF/LVH), stroke/TIA, peripheral vascular disease, renal  insufficiency, or hypertensive retinopathy.     Current Medications (verified): 1)  Micardis Hct 80-25 Mg  Tabs (Telmisartan-Hctz) .... One Tablet By Mouth Daily For Blood Pressure 2)  Asa 81 .Marland Kitchen.. 1 By Mouth Qday. 3)  Nasacort Aq 55 Mcg/act  Aers (Triamcinolone Acetonide(Nasal)) .Marland Kitchen.. 1 Spray in Each Nostril Two Times A Daily 4)  Flexeril 10 Mg  Tabs (Cyclobenzaprine Hcl) .Marland Kitchen.. 1 By Mouth Q8hrs As Needed Muscle Spasm. 5)  Crestor 20 Mg Tabs (Rosuvastatin Calcium) .... One Tablet By Mouth Nightly For Cholesterol 6)  Metoprolol Tartrate 25 Mg Tabs (Metoprolol Tartrate) .... One Tablet By Mouth Two Times A Day For Blood Pressure **note Change in Dose** 7)  Naproxen 500 Mg Tabs (Naproxen) .... Take One Tablet Every 12 Hours As Needed For Headache. Take With Food. 8)  Aciphex 20 Mg Tbec (Rabeprazole Sodium) .... Take 1 Tablet By Mouth Once A Day 9)  Allegra 180 Mg Tabs (Fexofenadine Hcl) .... One Tablet By Mouth Daily For Allergies **pharmacy - D/c Lorantadine** 10)  Meclizine Hcl 25 Mg Tabs (Meclizine Hcl) .... One Tablet By Mouth Daily As Needed For Dizziness 11)  Trazodone Hcl 50 Mg Tabs (Trazodone Hcl) .Marland Kitchen.. 1 Tablet By Mouth Nightly As Needed For Sleep 12)  Promethazine Hcl 25 Mg Tabs (Promethazine Hcl) .... One Tablet By Mouth Daily As Needed For Nausea  Allergies (verified): 1)  !  Septra  Review of Systems General:  Denies loss of appetite; + dizziness. CV:  Denies chest pain or discomfort and difficulty breathing at night. Resp:  Denies cough. GI:  Complains of abdominal pain; denies constipation, nausea, and vomiting. MS:  Complains of joint pain; right hand stiffness.  Physical Exam  General:  alert.   Head:  normocephalic.   Abdomen:  left upper abdomen - tenderness with palpation,  inflammation Msk:  normal ROM.   Neurologic:  alert & oriented X3.   Skin:  color normal.   Psych:  Oriented X3.     Impression & Recommendations:  Problem # 1:  ABDOMINAL TENDERNESS, LEFT  UPPER QUADRANT (ICD-789.62) area is actually not in her breast  it is in the left upper quadrant - apply heat to area and continue to monitor no nausea, vomiting or constipation  Problem # 2:  VERTIGO (ICD-780.4) Pt is supposed to be getting ENT referral Her updated medication list for this problem includes:    Allegra 180 Mg Tabs (Fexofenadine hcl) ..... One tablet by mouth daily for allergies **pharmacy - d/c lorantadine**    Meclizine Hcl 25 Mg Tabs (Meclizine hcl) ..... One tablet by mouth daily as needed for dizziness    Promethazine Hcl 25 Mg Tabs (Promethazine hcl) ..... One tablet by mouth daily as needed for nausea  Complete Medication List: 1)  Micardis Hct 80-25 Mg Tabs (Telmisartan-hctz) .... One tablet by mouth daily for blood pressure 2)  Asa 81  .Marland Kitchen.. 1 by mouth qday. 3)  Nasacort Aq 55 Mcg/act Aers (Triamcinolone acetonide(nasal)) .Marland Kitchen.. 1 spray in each nostril two times a daily 4)  Flexeril 10 Mg Tabs (Cyclobenzaprine hcl) .Marland Kitchen.. 1 by mouth q8hrs as needed muscle spasm. 5)  Crestor 20 Mg Tabs (Rosuvastatin calcium) .... One tablet by mouth nightly for cholesterol 6)  Metoprolol Tartrate 25 Mg Tabs (Metoprolol tartrate) .... One tablet by mouth two times a day for blood pressure **note change in dose** 7)  Naproxen 500 Mg Tabs (Naproxen) .... Take one tablet every 12 hours as needed for headache. take with food. 8)  Aciphex 20 Mg Tbec (Rabeprazole sodium) .... Take 1 tablet by mouth once a day 9)  Allegra 180 Mg Tabs (Fexofenadine hcl) .... One tablet by mouth daily for allergies **pharmacy - d/c lorantadine** 10)  Meclizine Hcl 25 Mg Tabs (Meclizine hcl) .... One tablet by mouth daily as needed for dizziness 11)  Trazodone Hcl 50 Mg Tabs (Trazodone hcl) .Marland Kitchen.. 1 tablet by mouth nightly as needed for sleep 12)  Promethazine Hcl 25 Mg Tabs (Promethazine hcl) .... One tablet by mouth daily as needed for nausea  Hypertension Assessment/Plan:      The patient's hypertensive risk group  is category B: At least one risk factor (excluding diabetes) with no target organ damage.  Her calculated 10 year risk of coronary heart disease is 11 %.  Today's blood pressure is 133/85.  Her blood pressure goal is < 140/90.  Patient Instructions: 1)  Try to call and schedule your appointment at [facility] for ENT referral. 2)  Left upper abdomen - apply warm compress to left upper abdomen 3)  Hands - use tylenol arthritis for hands 4)  Follow up as needed - if symptoms in stomach persist for the next 2 weeks then come back to this office. you may need additional testing

## 2010-04-13 NOTE — Assessment & Plan Note (Signed)
Summary: FU FOR BP CHECK///KT  Nurse Visit   Vital Signs:  Patient profile:   60 year old female Menstrual status:  postmenopausal Pulse rate:   80 / minute Pulse rhythm:   regular Resp:     20 per minute BP sitting:   136 / 82  (left arm)  Patient Instructions: 1)  Seen by Wende Mott 2)  Your left ear had some hard wax which we flushed -- your ear drum looks clear.  Your right ear was normal. 3)  Keep your appointment with the ENT doctor on Thursday -- your ear should stay clear until then 4)  Don't ever put anything in your ear -- if you have problems with wax, call us. 5)  Call us if anything changes or if you have any questions.   Impression & Recommendations:  Problem # 1:  CERUMEN IMPACTION, BILATERAL (ICD-380.4)  Right ear was normal Left ear had small amount soft cerumen removed with ear curette Small amount hard cerumen flushed out, TM clearly visible Has appt. with ENT on Thursday  Orders: Cerumen Impaction Removal (25956)  Complete Medication List: 1)  Micardis Hct 80-25 Mg Tabs (Telmisartan-hctz) .... One tablet by mouth daily for blood pressure 2)  Asa 81  .Marland Kitchen.. 1 by mouth qday. 3)  Nasacort Aq 55 Mcg/act Aers (Triamcinolone acetonide(nasal)) .Marland Kitchen.. 1 spray in each nostril two times a daily 4)  Flexeril 10 Mg Tabs (Cyclobenzaprine hcl) .Marland Kitchen.. 1 by mouth q8hrs as needed muscle spasm. 5)  Crestor 20 Mg Tabs (Rosuvastatin calcium) .... One tablet by mouth nightly for cholesterol 6)  Metoprolol Tartrate 25 Mg Tabs (Metoprolol tartrate) .... One tablet by mouth two times a day for blood pressure **note change in dose** 7)  Aciphex 20 Mg Tbec (Rabeprazole sodium) .... Take 1 tablet by mouth once a day 8)  Allegra 180 Mg Tabs (Fexofenadine hcl) .... One tablet by mouth daily for allergies **pharmacy - d/c lorantadine** 9)  Meclizine Hcl 25 Mg Tabs (Meclizine hcl) .... One tablet by mouth daily as needed for dizziness 10)  Trazodone Hcl 50 Mg Tabs (Trazodone hcl) .Marland Kitchen.. 1  tablet by mouth nightly as needed for sleep 11)  Promethazine Hcl 25 Mg Tabs (Promethazine hcl) .... One tablet by mouth daily as needed for nausea 12)  Triamcinolone Acetonide 0.1 % Crea (Triamcinolone acetonide) .... One applicaton topically two times a day to affected area for itching 13)  Hydroxyzine Hcl 25 Mg Tabs (Hydroxyzine hcl) .... One tablet by mouth daily as needed for itching 14)  Diclofenac Sodium 75 Mg Tbec (Diclofenac sodium) .... One tablet by mouth two times a day for knee pain   Review of Systems ENT:  Complains of decreased hearing, earache, hoarseness, nasal congestion, nosebleeds, ringing in ears, and sinus pressure; denies difficulty swallowing, ear discharge, and sore throat; sometimes right ear aches, sometimes hoarse. Uses nasal spray for nasal congestion, works well.. CV:  Complains of fatigue, lightheadness, and shortness of breath with exertion; denies bluish discoloration of lips or nails, chest pain or discomfort, difficulty breathing at night, difficulty breathing while lying down, fainting, leg cramps with exertion, near fainting, palpitations, swelling of feet, swelling of hands, and weight gain.   Physical Exam  Ears:  some cerumen still present.  Right ear has small amount light brown, left ear has dark brown near TM.   Primary Care Provider:  Lehman Prom FNP  CC:  BP check and ear inspection.  History of Present Illness: On 12/01/09 pt. came to  clinic for ear irrigation due to cerumen impaction.  BP at that time was 150/90.  No problems since ears were irrigated.  Took all her meds this morning.  CC: BP check and ear inspection   Allergies: 1)  ! Septra  Orders Added: 1)  Est. Patient Level II [[address]] 2)  Cerumen Impaction Removal [60454]

## 2010-04-13 NOTE — Letter (Signed)
Summary: AUDIOLOGY EVAL//[facility]  AUDIOLOGY EVAL// [facility]   Imported By: Arta Bruce 12/28/2009 11:42:28  _____________________________________________________________________  External Attachment:    Type:   Image     Comment:   External Document

## 2010-04-13 NOTE — Letter (Signed)
Summary: Work Excuse  [facility]  [address] [place], [place] [address]   Phone: [phone]  Fax: [phone]    Today's Date: June 16, 2009  Name of Patient: Victoria Campos  The above named patient had a medical visit today   Please take this into consideration when reviewing the time away from work  Special Instructions:  [  ] None  [ X ] To be off the remainder of today, returning to the normal work / school schedule tomorrow.  [  ] To be off until the next scheduled appointment on ______________________.  [  ] Other ________________________________________________________________ ________________________________________________________________________   Sincerely yours,   Lehman Prom FNP [facility]

## 2010-04-13 NOTE — Assessment & Plan Note (Signed)
Summary: flu shot////kt  Nurse Visit   Allergies: 1)  ! Septra  Immunizations Administered:  Influenza Vaccine # 1:    Vaccine Type: Fluvax 3+    Site: right deltoid    Mfr: [employer]    Dose: 0.5 ml    Route: IM    Given by: Dutch Quint RN    Exp. Date: 09/09/2010    Lot #: [id number]    VIS given: 10/04/09 version given January 25, 2010.  Flu Vaccine Consent Questions:    Do you have a history of severe allergic reactions to this vaccine? no    Any prior history of allergic reactions to egg and/or gelatin? no    Do you have a sensitivity to the preservative Thimersol? no    Do you have a past history of Guillan-Barre Syndrome? no    Do you currently have an acute febrile illness? no    Have you ever had a severe reaction to latex? no    Vaccine information given and explained to patient? yes    Are you currently pregnant? no  Orders Added: 1)  Flu Vaccine 13yrs + [90658] 2)  Admin 1st Vaccine [90471] 3)  Est. Patient Nurse visit [09003]

## 2010-04-13 NOTE — Assessment & Plan Note (Signed)
Summary: HTN/Cerumen Impaction   Vital Signs:  Patient profile:   60 year old female Menstrual status:  postmenopausal Weight:      182.2 pounds Temp:     98.3 degrees F oral Pulse rate:   72 / minute Pulse rhythm:   regular Resp:     20 per minute BP sitting:   150 / 90  (left arm) Cuff size:   regular  Vitals Entered By: Levon Hedger (December 01, 2009 2:23 PM) CC: follow-up visit, Hypertension Management, Lipid Management, Headache, Depression Is Patient Diabetic? No Pain Assessment Patient in pain? no       Does patient need assistance? Functional Status Self care Ambulation Normal Comments pt did not bring medication today   Primary Care Provider:  Lehman Prom FNP  CC:  follow-up visit, Hypertension Management, Lipid Management, Headache, and Depression.  History of Present Illness:   Pt into the office for f/u.  Tinnitis - pt has an appt with [facility] on October 6th.  Pt still has ringing in the ears and dizziness.  Pt did not bring medications with her to the office today.  Advised pt to bring all meds to every visit. The patient denies that she feels like life is not worth living, denies that she wishes that she were dead, and denies that she has thought about ending her life.         Hypertension History:      She denies headache, chest pain, and palpitations.        Positive major cardiovascular risk factors include female age 32 years old or older, hyperlipidemia, and hypertension.  Negative major cardiovascular risk factors include no history of diabetes and non-tobacco-user status.        Further assessment for target organ damage reveals no history of ASHD, cardiac end-organ damage (CHF/LVH), stroke/TIA, peripheral vascular disease, renal insufficiency, or hypertensive retinopathy.    Lipid Management History:      Positive NCEP/ATP III risk factors include female age 56 years old or older and hypertension.  Negative NCEP/ATP III risk factors  include no history of early menopause without estrogen hormone replacement, non-diabetic, non-tobacco-user status, no ASHD (atherosclerotic heart disease), no prior stroke/TIA, no peripheral vascular disease, and no history of aortic aneurysm.        The patient states that she does not know about the "Therapeutic Lifestyle Change" diet.  The patient does not know about adjunctive measures for cholesterol lowering.  She expresses no side effects from her lipid-lowering medication.  The patient denies any symptoms to suggest myopathy or liver disease.        Habits & Providers  Alcohol-Tobacco-Diet     Alcohol drinks/day: 0     Tobacco Status: quit  Exercise-Depression-Behavior     Have you felt down or hopeless? no     Have you felt little pleasure in things? no     Depression Counseling: not indicated; screening negative for depression     Drug Use: no     Seat Belt Use: 100     Sun Exposure: occasionally  Current Medications (verified): 1)  Micardis Hct 80-25 Mg  Tabs (Telmisartan-Hctz) .... One Tablet By Mouth Daily For Blood Pressure 2)  Asa 81 .Marland Kitchen.. 1 By Mouth Qday. 3)  Nasacort Aq 55 Mcg/act  Aers (Triamcinolone Acetonide(Nasal)) .Marland Kitchen.. 1 Spray in Each Nostril Two Times A Daily 4)  Flexeril 10 Mg  Tabs (Cyclobenzaprine Hcl) .Marland Kitchen.. 1 By Mouth Q8hrs As Needed Muscle Spasm. 5)  Crestor  20 Mg Tabs (Rosuvastatin Calcium) .... One Tablet By Mouth Nightly For Cholesterol 6)  Metoprolol Tartrate 25 Mg Tabs (Metoprolol Tartrate) .... One Tablet By Mouth Two Times A Day For Blood Pressure **note Change in Dose** 7)  Naproxen 500 Mg Tabs (Naproxen) .... Take One Tablet Every 12 Hours As Needed For Headache. Take With Food. 8)  Aciphex 20 Mg Tbec (Rabeprazole Sodium) .... Take 1 Tablet By Mouth Once A Day 9)  Allegra 180 Mg Tabs (Fexofenadine Hcl) .... One Tablet By Mouth Daily For Allergies **pharmacy - D/c Lorantadine** 10)  Meclizine Hcl 25 Mg Tabs (Meclizine Hcl) .... One Tablet By Mouth  Daily As Needed For Dizziness 11)  Trazodone Hcl 50 Mg Tabs (Trazodone Hcl) .Marland Kitchen.. 1 Tablet By Mouth Nightly As Needed For Sleep 12)  Promethazine Hcl 25 Mg Tabs (Promethazine Hcl) .... One Tablet By Mouth Daily As Needed For Nausea 13)  Triamcinolone Acetonide 0.1 % Crea (Triamcinolone Acetonide) .... One Applicaton Topically Two Times A Day To Affected Area For Itching 14)  Hydroxyzine Hcl 25 Mg Tabs (Hydroxyzine Hcl) .... One Tablet By Mouth Daily As Needed For Itching  Allergies: 1)  ! Septra  Review of Systems ENT:  Denies ringing in ears. CV:  Denies chest pain or discomfort. Resp:  Denies cough. GI:  Denies abdominal pain, nausea, and vomiting. MS:  Complains of joint pain; right knee.  Physical Exam  General:  alert.   Head:  normocephalic.   Ears:  ear piercing(s) noted.   bil ears with moderate cerumen Lungs:  normal breath sounds.   Heart:  normal rate and regular rhythm.   Abdomen:  normal bowel sounds.   Msk:  up to the exam Neurologic:  alert & oriented X3.   Skin:  color normal.   Psych:  Oriented X3.     Impression & Recommendations:  Problem # 1:  TINNITUS (ICD-388.30) pt to keep the appt at [facility]  Problem # 2:  HYPERTENSION, BENIGN ESSENTIAL (ICD-401.1) Blood pressure is elevated today will monitor Her updated medication list for this problem includes:    Micardis Hct 80-25 Mg Tabs (Telmisartan-hctz) ..... One tablet by mouth daily for blood pressure    Metoprolol Tartrate 25 Mg Tabs (Metoprolol tartrate) ..... One tablet by mouth two times a day for blood pressure **note change in dose**  Problem # 3:  HYPERLIPIDEMIA (ICD-272.4) continue current medications Her updated medication list for this problem includes:    Crestor 20 Mg Tabs (Rosuvastatin calcium) ..... One tablet by mouth nightly for cholesterol  Problem # 4:  KNEE PAIN, RIGHT (ICD-719.46)  The following medications were removed from the medication list:    Naproxen 500 Mg Tabs  (Naproxen) .Marland Kitchen... Take one tablet every 12 hours as needed for headache. take with food. Her updated medication list for this problem includes:    Flexeril 10 Mg Tabs (Cyclobenzaprine hcl) .Marland Kitchen... 1 by mouth q8hrs as needed muscle spasm.    Diclofenac Sodium 75 Mg Tbec (Diclofenac sodium) ..... One tablet by mouth two times a day for knee pain  Problem # 5:  CERUMEN IMPACTION, BILATERAL (ICD-380.4) ears irrigated today Orders: Cerumen Impaction Removal (96295)  Complete Medication List: 1)  Micardis Hct 80-25 Mg Tabs (Telmisartan-hctz) .... One tablet by mouth daily for blood pressure 2)  Asa 81  .Marland Kitchen.. 1 by mouth qday. 3)  Nasacort Aq 55 Mcg/act Aers (Triamcinolone acetonide(nasal)) .Marland Kitchen.. 1 spray in each nostril two times a daily 4)  Flexeril 10 Mg Tabs (Cyclobenzaprine  hcl) .... 1 by mouth q8hrs as needed muscle spasm. 5)  Crestor 20 Mg Tabs (Rosuvastatin calcium) .... One tablet by mouth nightly for cholesterol 6)  Metoprolol Tartrate 25 Mg Tabs (Metoprolol tartrate) .... One tablet by mouth two times a day for blood pressure **note change in dose** 7)  Aciphex 20 Mg Tbec (Rabeprazole sodium) .... Take 1 tablet by mouth once a day 8)  Allegra 180 Mg Tabs (Fexofenadine hcl) .... One tablet by mouth daily for allergies **pharmacy - d/c lorantadine** 9)  Meclizine Hcl 25 Mg Tabs (Meclizine hcl) .... One tablet by mouth daily as needed for dizziness 10)  Trazodone Hcl 50 Mg Tabs (Trazodone hcl) .Marland Kitchen.. 1 tablet by mouth nightly as needed for sleep 11)  Promethazine Hcl 25 Mg Tabs (Promethazine hcl) .... One tablet by mouth daily as needed for nausea 12)  Triamcinolone Acetonide 0.1 % Crea (Triamcinolone acetonide) .... One applicaton topically two times a day to affected area for itching 13)  Hydroxyzine Hcl 25 Mg Tabs (Hydroxyzine hcl) .... One tablet by mouth daily as needed for itching 14)  Diclofenac Sodium 75 Mg Tbec (Diclofenac sodium) .... One tablet by mouth two times a day for knee  pain  Hypertension Assessment/Plan:      The patient's hypertensive risk group is category B: At least one risk factor (excluding diabetes) with no target organ damage.  Her calculated 10 year risk of coronary heart disease is 15 %.  Today's blood pressure is 150/90.  Her blood pressure goal is < 140/90.  Lipid Assessment/Plan:      Based on NCEP/ATP III, the patient's risk factor category is "0-1 risk factors".  The patient's lipid goals are as follows: Total cholesterol goal is 200; LDL cholesterol goal is 130; HDL cholesterol goal is 40; Triglyceride goal is 150.    Patient Instructions: 1)  Flu vaccine will be due next year. 2)  Ears will be cleaned today.   3)  Come into the office on October 3rd to let the triage nurse look in your ears. If ok, no charge since we won't need an irrigation Prescriptions: DICLOFENAC SODIUM 75 MG TBEC (DICLOFENAC SODIUM) One tablet by mouth two times a day for knee pain  #60 x 1   Entered and Authorized by:   Lehman Prom FNP   Signed by:   Lehman Prom FNP on 12/01/2009   Method used:   Print then Give to Patient   RxID:   [id number]

## 2010-04-13 NOTE — Letter (Signed)
Summary: STAFF MEDICAL REPORT  STAFF MEDICAL REPORT   Imported By: Arta Bruce 08/18/2009 09:51:13  _____________________________________________________________________  External Attachment:    Type:   Image     Comment:   External Document

## 2010-04-13 NOTE — Progress Notes (Signed)
Summary: COUGH AND BODY ACHING  Phone Note Call from Patient Call back at [phone]- WORK #   Reason for Call: Talk to Nurse Summary of Call: MARTIN PT. SHE HAS A COUGH SHE CAN'T GET RID OF AND SHE IS ACHING. Initial call taken by: Leodis Rains,  January 18, 2010 10:38 AM  Follow-up for Phone Call        Cough started on Sunday, productive of thin/thick yellow mucus.  Denies dyspnea except at night because of the heat.  Uses nasal spray. Denies nasal discharge, no hemoptysis.   Is taking allergy medication, helps a little.  Has light headache. Legs are also aching.   Instructed re Tylenol every 4-6 hours for pain, drink plenty of fluids. Had a fever 99.5 today -- works around children.  Has not had her flu shot -- appt. 01/20/10.  Advised not to get flu vaccine if running a fever and re home management for cold per protocol, to call back if symptoms persist or worsen -- verbalized understanding.  Dutch Quint RN  January 18, 2010 4:58 PM

## 2010-04-13 NOTE — Miscellaneous (Signed)
Summary: Rehab Report//DISCHARGE SUMMARY  Rehab Report//DISCHARGE SUMMARY   Imported By: Arta Bruce 05/11/2009 11:39:08  _____________________________________________________________________  External Attachment:    Type:   Image     Comment:   External Document

## 2010-04-13 NOTE — Assessment & Plan Note (Signed)
Summary: Acute - Vertigo   Vital Signs:  Patient profile:   60 year old female Menstrual status:  postmenopausal Weight:      182.4 pounds BMI:     30.94 BSA:     1.89 Temp:     97.9 degrees F oral Pulse rate:   74 / minute Pulse rhythm:   regular Resp:     16 per minute BP sitting:   164 / 91  (left arm) Cuff size:   regular  Vitals Entered By: Levon Hedger (April 20, 2009 3:38 PM) CC: right ear pain x 1 week and dizziness has returned, Hypertension Management, Lipid Management Is Patient Diabetic? No Pain Assessment Patient in pain? no       Does patient need assistance? Functional Status Self care Ambulation Normal LMP - Character: menopausal     Menstrual Status postmenopausal Last PAP Result  Specimen Adequacy: Satisfactory for evaluation.   Interpretation/Result:Negative for intraepithelial Lesion or Malignancy.      Primary Care Provider:  [facility]  CC:  right ear pain x 1 week and dizziness has returned, Hypertension Management, and Lipid Management.  History of Present Illness:  Pt into the office with complaints of right ear pain that started 1 week ago. Left ear pain started 1 day ago. Previous hx of vestibular abnormality or vertigo.  Pt was seen at Physical therapy and she completed her her course of treatment.  Symptoms did get better following the end of the six weeks course however dizziness has returned despite completing exercises. +nausea  -vomiting +dizziness +bil ear pain R>L  Hypertension History:      She denies headache, chest pain, and palpitations.  She notes no problems with any antihypertensive medication side effects.  Further comments include: dizziness.        Positive major cardiovascular risk factors include female age 25 years old or older, hyperlipidemia, and hypertension.  Negative major cardiovascular risk factors include no history of diabetes and non-tobacco-user status.        Further assessment for target organ  damage reveals no history of ASHD, cardiac end-organ damage (CHF/LVH), stroke/TIA, peripheral vascular disease, renal insufficiency, or hypertensive retinopathy.    Lipid Management History:      Positive NCEP/ATP III risk factors include female age 84 years old or older and hypertension.  Negative NCEP/ATP III risk factors include no history of early menopause without estrogen hormone replacement, non-diabetic, non-tobacco-user status, no ASHD (atherosclerotic heart disease), no prior stroke/TIA, no peripheral vascular disease, and no history of aortic aneurysm.        The patient states that she does not know about the "Therapeutic Lifestyle Change" diet.  The patient does not know about adjunctive measures for cholesterol lowering.  She expresses no side effects from her lipid-lowering medication.  The patient denies any symptoms to suggest myopathy or liver disease.      Allergies (verified): 1)  ! Septra  Review of Systems General:  Complains of sleep disorder; denies fever. ENT:  Complains of earache; denies decreased hearing and nasal congestion. CV:  Denies chest pain or discomfort. Resp:  Denies cough. GI:  Denies abdominal pain, nausea, and vomiting.  Physical Exam  General:  alert.   Eyes:  pupils reactive to light.   Ears:  bil canals with minimal cerumen top of TM visualized. No erythema Lungs:  normal breath sounds.   Heart:  normal rate and regular rhythm.   Abdomen:  normal bowel sounds.   Msk:  up to  the exam table Neurologic:  alert & oriented X3.   Skin:  no rashes.   Psych:  Oriented X3.     Impression & Recommendations:  Problem # 1:  VERTIGO (ICD-780.4) Will refer to ENT current problem is persistent will change loratadine to allegra use nasal spray two times a day  Her updated medication list for this problem includes:    Allegra 180 Mg Tabs (Fexofenadine hcl) ..... One tablet by mouth daily for allergies **pharmacy - d/c lorantadine**    Meclizine Hcl  25 Mg Tabs (Meclizine hcl) ..... One tablet by mouth daily as needed for dizziness    Promethazine Hcl 25 Mg Tabs (Promethazine hcl) ..... One tablet by mouth daily as needed for nausea  Problem # 2:  UNSPECIFIED SLEEP DISTURBANCE (ICD-780.50) will refill trazodone as helps pt rest  Complete Medication List: 1)  Micardis Hct 80-25 Mg Tabs (Telmisartan-hctz) .... One tablet by mouth daily for blood pressure 2)  Asa 81  .Marland Kitchen.. 1 by mouth qday. 3)  Nasacort Aq 55 Mcg/act Aers (Triamcinolone acetonide(nasal)) .Marland Kitchen.. 1 spray in each nostril two times a daily 4)  Flexeril 10 Mg Tabs (Cyclobenzaprine hcl) .Marland Kitchen.. 1 by mouth q8hrs as needed muscle spasm. 5)  Crestor 20 Mg Tabs (Rosuvastatin calcium) .... One tablet by mouth nightly for cholesterol 6)  Metoprolol Tartrate 25 Mg Tabs (Metoprolol tartrate) .... Take 1/2 tablet by mouth 2 times daily 7)  Naproxen 500 Mg Tabs (Naproxen) .... Take one tablet every 12 hours as needed for headache. take with food. 8)  Aciphex 20 Mg Tbec (Rabeprazole sodium) .... Take 1 tablet by mouth once a day 9)  Allegra 180 Mg Tabs (Fexofenadine hcl) .... One tablet by mouth daily for allergies **pharmacy - d/c lorantadine** 10)  Meclizine Hcl 25 Mg Tabs (Meclizine hcl) .... One tablet by mouth daily as needed for dizziness 11)  Trazodone Hcl 50 Mg Tabs (Trazodone hcl) .Marland Kitchen.. 1 tablet by mouth nightly as needed for sleep 12)  Promethazine Hcl 25 Mg Tabs (Promethazine hcl) .... One tablet by mouth daily as needed for nausea  Hypertension Assessment/Plan:      The patient's hypertensive risk group is category B: At least one risk factor (excluding diabetes) with no target organ damage.  Her calculated 10 year risk of coronary heart disease is 17 %.  Today's blood pressure is 164/91.  Her blood pressure goal is < 140/90.  Lipid Assessment/Plan:      Based on NCEP/ATP III, the patient's risk factor category is "2 or more risk factors and a calculated 10 year CAD risk of < 20%".  The  patient's lipid goals are as follows: Total cholesterol goal is 200; LDL cholesterol goal is 130; HDL cholesterol goal is 40; Triglyceride goal is 150.    Patient Instructions: 1)  Use your nasal spray once in the morning and once at night. 2)  Hold your head down. 3)  You will be referred to ENT as your vertigo has continued despite multiple treatments and physical therapy. If you have not received the appointment by next Wednesday, call this office. Prescriptions: TRAZODONE HCL 50 MG TABS (TRAZODONE HCL) 1 tablet by mouth nightly as needed for sleep  #30 x 0   Entered and Authorized by:   Lehman Prom FNP   Signed by:   Lehman Prom FNP on 04/20/2009   Method used:   Faxed to ...       [facility] (retail)  [address] [place], [place]  [address]       Ph: [phone]       Fax: [phone]   RxID:   [id number] ALLEGRA 180 MG TABS (FEXOFENADINE HCL) One tablet by mouth daily for allergies **Pharmacy - D/C lorantadine**  #30 x 5   Entered and Authorized by:   Lehman Prom FNP   Signed by:   Lehman Prom FNP on 04/20/2009   Method used:   Faxed to ...       [facility] (retail)       [address] [place], [place]  [address]       Ph: [phone]       Fax: [phone]   RxID:   [id number] TRAZODONE HCL 50 MG TABS (TRAZODONE HCL) 1 tablet by mouth nightly as needed for sleep  #30 x 0   Entered and Authorized by:   Lehman Prom FNP   Signed by:   Lehman Prom FNP on 04/20/2009   Method used:   Print then Give to Patient   RxID:   [id number]

## 2010-04-13 NOTE — Letter (Signed)
Summary: Generic Letter  [facility]  [address] [place], [place] [address]   Phone: [phone]  Fax: [phone]    02/06/2010  Victoria Campos [address] [place], [place]  [address]  Dear Ms. Raul Del,  I attempted to call you twice at home regarding your request for me to contact you about your hearing results.  Phone just keeps ringing and does not allow me to leave a message.  I also called your work number but I was told that you were not available.    I hope you were able to go to [facility] and be assessed for a hearing device should you decide that is what you want to do to restore your hearing.   Notify this office if there is anything else we can do for you.   Sincerely,   Lehman Prom FNP [facility]

## 2010-04-13 NOTE — Progress Notes (Signed)
Summary: WE NEED TO CALL [facility]  Phone Note Call from Patient Call back at [phone]   Summary of Call: MARTIN PT. MS Bond CALLED TO LET YOU KNOW THAT SHE CALLED [facility], AND WAS TOLD THAT YOU WOULD NEED TO CALL THEM SO SHE CAN MAKE THE APPT. AND [facility] WILL SEND HER DOWN TO FINANCE DEPT. Initial call taken by: Leodis Rains,  June 17, 2009 11:35 AM  Follow-up for Phone Call        pt states it was for the ENT referral... pt states that she was told to call [facility] per Stanton Kidney to see about financial assistance she did speak with a counselor but she was advised that she needed an appt first in order for their office to let her know what her discount would be. Will forward phone note to Stanton Kidney to move forward with referral and contact patient once all is set then patient states she will call back to financial assistance once completed. Follow-up by: Mikey College CMA,  June 21, 2009 11:33 AM  Additional Follow-up for Phone Call Additional follow up Details #1::        See phone note dated 06-22-09 Additional Follow-up by: Candi Leash,  June 23, 2009 8:38 AM

## 2010-04-13 NOTE — Progress Notes (Signed)
Summary: SOUND IN EAR GETTING WORSE  Phone Note Call from Patient Call back at [facility] Phone [phone] Call back at [phone]-HER CELL   Summary of Call: MARTIN PT. MS Fleece CALLED AND SAYS THAT SHE FINISHED HER THERAPY FOR HER HEARING ON LAST WEEK. AND SHE WAS TOLD THE THE THERAPSIT TO CALL THE OFFICE BECAUSE THE SOUND (NOISE) IN HER EAR IS GETTING LOUDER AND WORSE. CHRISTINE THE THE THERAPIST IS SUPOSE TO BE SENDING OVER HER OFFICE VISITS. Initial call taken by: Leodis Rains,  March 14, 2009 2:57 PM  Follow-up for Phone Call        forward to N. Daphine Deutscher, FNP Follow-up by: Levon Hedger,  March 14, 2009 5:25 PM  Additional Follow-up for Phone Call Additional follow up Details #1::        will refer to ENT. referral in basket notify pt and inform her of referral date/time Additional Follow-up by: Lehman Prom FNP,  March 14, 2009 5:34 PM  New Problems: TINNITUS (ICD-388.30)   Additional Follow-up for Phone Call Additional follow up Details #2::    left message on answer machine for pt. to return call, referral information up front [facility] RN  March 15, 2009 1:01 PM    referral faxed to [facility] and message left for pt to notify her that we are completing a referral....Marland KitchenMarland KitchenMikey College CMA  March 17, 2009 12:31 PM   Additional Follow-up for Phone Call Additional follow up Details #3:: Details for Additional Follow-up Action Taken: MS Sawatzky RETURNED CALL, AND SHE IS AWARE THAT HER REFERRAL IS BEING WORKED ON FOR ENT DR, AND SHE WIILL BE NOTIFIED.Cala Bradford Tinnin  March 18, 2009 8:22 AM   New Problems: TINNITUS (ICD-388.30)

## 2010-04-13 NOTE — Assessment & Plan Note (Signed)
Summary: Foot pain/HTN   Vital Signs:  Patient profile:   60 year old female Menstrual status:  postmenopausal Weight:      184.0 pounds BMI:     31.21 BSA:     1.90 Temp:     97.9 degrees F oral Pulse rate:   76 / minute Pulse rhythm:   regular Resp:     20 per minute BP sitting:   171 / 101  (left arm) Cuff size:   regular  Vitals Entered By: Levon Hedger (May 20, 2009 10:52 AM) CC: both feet feels like needles are in them, but it is mainly her left foot x 2 1/2 weeks Is Patient Diabetic? No Pain Assessment Patient in pain? no       Does patient need assistance? Functional Status Self care Ambulation Normal   Primary Care Provider:  Lehman Prom FNP  CC:  both feet feels like needles are in them and but it is mainly her left foot x 2 1/2 weeks.  History of Present Illness:  Pt into the office with complaints of foot pain. Left foot is mainly affected Usually when she wakes in the morning and puts her foot on the floor and she feels the 2nd and 3rd toes tingling. Sometimes on Sunday she wears very low healed shoes. No swelling of the toes.   ENT - pt is aware that she will need to contact [facility] for the specifics on payment.  Pt reports that she has attempted to call that office and has left messages but has not received a return phone call.  Colon Cancer - 4 siblings with colon cancer. Pt has never had a colonscopy.   She is aware that she needs a colonscopy given her risk factors but she is not able to afford the screening test.  Allergies (verified): 1)  ! Septra  Review of Systems General:  Denies fever. CV:  Denies chest pain or discomfort. Resp:  Denies cough. GI:  Denies abdominal pain, nausea, and vomiting. MS:  foot pain.  Physical Exam  General:  alert.   Head:  normocephalic.   Lungs:  normal breath sounds.   Heart:  normal rate and regular rhythm.   Abdomen:  normal bowel sounds.   Msk:  up to the exam  Neurologic:  alert &  oriented X3 and gait normal.   Skin:  color normal.   Psych:  Oriented X3.     Impression & Recommendations:  Problem # 1:  FOOT PAIN (ICD-729.5) advised pt to stretch the ligaments in his foot  Problem # 2:  HYPERTENSION, BENIGN ESSENTIAL (ICD-401.1) Will increase the metoprolol by mouth two times a day  will continue the micardis Her updated medication list for this problem includes:    Micardis Hct 80-25 Mg Tabs (Telmisartan-hctz) ..... One tablet by mouth daily for blood pressure    Metoprolol Tartrate 25 Mg Tabs (Metoprolol tartrate) ..... One tablet by mouth two times a day for blood pressure **note change in dose**  Problem # 3:  TINNITUS (ICD-388.30) advised pt that she needs to get ENT referral  Problem # 4:  NEOPLASM, MALIGNANT, COLON, FAMILY HX (ICD-V16.0) advised pt that she needs colonscopy given strong family history will see if pt can get a referral - she may have to pay some out of pocket cost  Complete Medication List: 1)  Micardis Hct 80-25 Mg Tabs (Telmisartan-hctz) .... One tablet by mouth daily for blood pressure 2)  Asa 81  .Marland KitchenMarland KitchenMarland Kitchen 1  by mouth qday. 3)  Nasacort Aq 55 Mcg/act Aers (Triamcinolone acetonide(nasal)) .Marland Kitchen.. 1 spray in each nostril two times a daily 4)  Flexeril 10 Mg Tabs (Cyclobenzaprine hcl) .Marland Kitchen.. 1 by mouth q8hrs as needed muscle spasm. 5)  Crestor 20 Mg Tabs (Rosuvastatin calcium) .... One tablet by mouth nightly for cholesterol 6)  Metoprolol Tartrate 25 Mg Tabs (Metoprolol tartrate) .... One tablet by mouth two times a day for blood pressure **note change in dose** 7)  Naproxen 500 Mg Tabs (Naproxen) .... Take one tablet every 12 hours as needed for headache. take with food. 8)  Aciphex 20 Mg Tbec (Rabeprazole sodium) .... Take 1 tablet by mouth once a day 9)  Allegra 180 Mg Tabs (Fexofenadine hcl) .... One tablet by mouth daily for allergies **pharmacy - d/c lorantadine** 10)  Meclizine Hcl 25 Mg Tabs (Meclizine hcl) .... One tablet by mouth daily  as needed for dizziness 11)  Trazodone Hcl 50 Mg Tabs (Trazodone hcl) .Marland Kitchen.. 1 tablet by mouth nightly as needed for sleep 12)  Promethazine Hcl 25 Mg Tabs (Promethazine hcl) .... One tablet by mouth daily as needed for nausea  Patient Instructions: 1)  Foot pain - stretch the ligaments in your foot - roll a can of food under your foot back and forth 10 times.  Use a towel and pull forward to stretch the ligaments. 2)  your blood pressure is high.   3)  You will need to increase the metoprolol to two times a day  4)  Continue micardis daily 5)  Keep trying to call ENT at [facility] for your referral.   6)  Follow up as needed Prescriptions: METOPROLOL TARTRATE 25 MG TABS (METOPROLOL TARTRATE) One tablet by mouth two times a day for blood pressure **Note change in dose**  #60 x 5   Entered and Authorized by:   Lehman Prom FNP   Signed by:   Lehman Prom FNP on 05/20/2009   Method used:   Faxed to ...       [facility] (retail)       [address] [place], [place]  [address]       Ph: [phone]       Fax: [phone]   RxID:   [id number]

## 2010-04-13 NOTE — Progress Notes (Signed)
Summary: Office Visit//DEPRESSION SCREENING  Office Visit//DEPRESSION SCREENING   Imported By: Arta Bruce 09/01/2009 10:44:05  _____________________________________________________________________  External Attachment:    Type:   Image     Comment:   External Document

## 2010-04-13 NOTE — Assessment & Plan Note (Signed)
Summary: Complete Physical Exam   Vital Signs:  Patient profile:   60 year old female Menstrual status:  postmenopausal Weight:      185.7 pounds BMI:     31.50 Temp:     97.9 degrees F oral Pulse rate:   76 / minute Pulse rhythm:   regular Resp:     20 per minute BP sitting:   161 / 93  (left arm) Cuff size:   regular  Vitals Entered By: Levon Hedger (August 31, 2009 2:22 PM)  Nutrition Counseling: Patient's BMI is greater than 25 and therefore counseled on weight management options. CC: CPP...rash on arms, side, back, and legs and it itches alot, Hypertension Management, Lipid Management Is Patient Diabetic? No Pain Assessment Patient in pain? no       Does patient need assistance? Functional Status Self care Ambulation Normal   Primary Care Provider:  Lehman Prom FNP  CC:  CPP...rash on arms, side, back, and legs and it itches alot, Hypertension Management, and Lipid Management.  History of Present Illness:  Pt into the office for a complete physical exam  PAP - last done 1 year ago in this office Post menopausal - no menses for the past 4 years 2 children - grown Fiance'  Mammogram - last done 08/16/2009 no family of breast cancer  Optho - wears reading glasses, no recent eye appt  Dental - no recent dental appt - upper partial and lower partial  Colonscopy - Brother and 2 sisters with colon cancer Pt has never had a colonscopy No blood in stool, denies any constipation   Hypertension History:      She denies headache, chest pain, and palpitations.  Further comments include: Pt is taking micardis as ordered according to the pharmacy profile sheet.        Positive major cardiovascular risk factors include female age 41 years old or older, hyperlipidemia, and hypertension.  Negative major cardiovascular risk factors include no history of diabetes and non-tobacco-user status.        Further assessment for target organ damage reveals no history of ASHD,  cardiac end-organ damage (CHF/LVH), stroke/TIA, peripheral vascular disease, renal insufficiency, or hypertensive retinopathy.    Lipid Management History:      Positive NCEP/ATP III risk factors include female age 56 years old or older and hypertension.  Negative NCEP/ATP III risk factors include no history of early menopause without estrogen hormone replacement, non-diabetic, non-tobacco-user status, no ASHD (atherosclerotic heart disease), no prior stroke/TIA, no peripheral vascular disease, and no history of aortic aneurysm.        The patient states that she does not know about the "Therapeutic Lifestyle Change" diet.  The patient does not know about adjunctive measures for cholesterol lowering.  She expresses no side effects from her lipid-lowering medication.  Comments include: pt is taking meds as ordered.  The patient denies any symptoms to suggest myopathy or liver disease.     Habits & Providers  Alcohol-Tobacco-Diet     Alcohol drinks/day: 0     Tobacco Status: quit  Exercise-Depression-Behavior     Have you felt down or hopeless? no     Have you felt little pleasure in things? no     Depression Counseling: not indicated; screening negative for depression     Drug Use: no     Seat Belt Use: 100     Sun Exposure: occasionally  Comments: PHQ-9 score = 5  Allergies (verified): 1)  ! Septra  Review of  Systems Eyes:  Denies blurring. ENT:  Denies earache. CV:  Denies chest pain or discomfort. Resp:  Denies cough. GI:  Denies abdominal pain, bloody stools, constipation, nausea, and vomiting. GU:  Denies discharge. MS:  Denies joint pain. Derm:  Complains of rash; Noticed for about 1 month went to urgent care for rash and was given "pills" but rash persisted rash goes away and then returns no pain only itching. bilateral inner arms and side Admits that fiance who sleeps in the bed with her has similar rash that started at the same time. Neuro:  Denies headaches. Psych:   Denies anxiety and depression.  Physical Exam  General:  alert.   Head:  normocephalic.   Eyes:  pupils round.   Ears:  bil ears with moderate cerumen - unable to visualize the TM Nose:  no nasal discharge.   Mouth:  pharynx pink and moist.   Neck:  supple.   Chest Wall:  no mass.   Breasts:  skin/areolae normal, no masses, and no abnormal thickening.   Lungs:  normal breath sounds.   Heart:  normal rate and regular rhythm.   Abdomen:  soft, non-tender, and normal bowel sounds.   Rectal:  no external abnormalities.   Msk:  up to the exam table Pulses:  R radial normal and L radial normal.   Extremities:  no edema Neurologic:  alert & oriented X3.   Skin:  multiple nevi on face, chest and back bil inner arms, back with papules   Psych:  Oriented X3.    Pelvic Exam  Vulva:      normal appearance.   Urethra and Bladder:      Urethra--normal.  Bladder--normal.   Vagina:      physiologic discharge.   Cervix:      midposition.   Uterus:      smooth.   Adnexa:      nontender bilaterally.   Rectum:      normal, heme negative stool.      Impression & Recommendations:  Problem # 1:  ROUTINE GYNECOLOGICAL EXAMINATION (ICD-V72.31) labs done except lipids as pt is NOT fasting PAP done mammogram up to date tetanus up to date guaiac negative, pt needs colonscopy, aware of reason but is unable to afford - strong family history PHQ-9 score = 5 rec optho and dental exam Orders: Hemoccult Guaiac-1 spec.(in office) (82270) KOH/ WET Mount [phone]) Pap Smear, Thin Prep ( Collection of) (V7846) UA Dipstick w/o Micro (manual) (96295) T-Urine Microalbumin w/creat. ratio [phone]) T-TSH [phone]) T-HIV Antibody  (Reflex) [id number]) T-CBC w/Diff [id number]) T-Comprehensive Metabolic Panel [phone]) T- GC Chlamydia (01093)  Problem # 2:  HYPERTENSION, BENIGN ESSENTIAL (ICD-401.1) Bp is elevated today will continue current meds and recheck on next  visit Her updated medication list for this problem includes:    Micardis Hct 80-25 Mg Tabs (Telmisartan-hctz) ..... One tablet by mouth daily for blood pressure    Metoprolol Tartrate 25 Mg Tabs (Metoprolol tartrate) ..... One tablet by mouth two times a day for blood pressure **note change in dose**  Orders: EKG w/ Interpretation (93000)  Problem # 3:  HYPERLIPIDEMIA (ICD-272.4) unable to check labs today - pt is not fasting Her updated medication list for this problem includes:    Crestor 20 Mg Tabs (Rosuvastatin calcium) ..... One tablet by mouth nightly for cholesterol  Problem # 4:  INSECT BITE (ICD-919.4) ? bed bug bites information given - pt advised to clean home well  Problem # 5:  NEOPLASM,  MALIGNANT, COLON, FAMILY HX (ICD-V16.0)  Orders: Colonoscopy (Colon)  Problem # 6:  CERUMEN IMPACTION, BILATERAL (ICD-380.4)  irrigation done today in office  Orders: Cerumen Impaction Removal (81191)  Complete Medication List: 1)  Micardis Hct 80-25 Mg Tabs (Telmisartan-hctz) .... One tablet by mouth daily for blood pressure 2)  Asa 81  .Marland Kitchen.. 1 by mouth qday. 3)  Nasacort Aq 55 Mcg/act Aers (Triamcinolone acetonide(nasal)) .Marland Kitchen.. 1 spray in each nostril two times a daily 4)  Flexeril 10 Mg Tabs (Cyclobenzaprine hcl) .Marland Kitchen.. 1 by mouth q8hrs as needed muscle spasm. 5)  Crestor 20 Mg Tabs (Rosuvastatin calcium) .... One tablet by mouth nightly for cholesterol 6)  Metoprolol Tartrate 25 Mg Tabs (Metoprolol tartrate) .... One tablet by mouth two times a day for blood pressure **note change in dose** 7)  Naproxen 500 Mg Tabs (Naproxen) .... Take one tablet every 12 hours as needed for headache. take with food. 8)  Aciphex 20 Mg Tbec (Rabeprazole sodium) .... Take 1 tablet by mouth once a day 9)  Allegra 180 Mg Tabs (Fexofenadine hcl) .... One tablet by mouth daily for allergies **pharmacy - d/c lorantadine** 10)  Meclizine Hcl 25 Mg Tabs (Meclizine hcl) .... One tablet by mouth daily as  needed for dizziness 11)  Trazodone Hcl 50 Mg Tabs (Trazodone hcl) .Marland Kitchen.. 1 tablet by mouth nightly as needed for sleep 12)  Promethazine Hcl 25 Mg Tabs (Promethazine hcl) .... One tablet by mouth daily as needed for nausea 13)  Triamcinolone Acetonide 0.1 % Crea (Triamcinolone acetonide) .... One applicaton topically two times a day to affected area for itching  Hypertension Assessment/Plan:      The patient's hypertensive risk group is category B: At least one risk factor (excluding diabetes) with no target organ damage.  Her calculated 10 year risk of coronary heart disease is 17 %.  Today's blood pressure is 161/93.  Her blood pressure goal is < 140/90.  Lipid Assessment/Plan:      Based on NCEP/ATP III, the patient's risk factor category is "0-1 risk factors".  The patient's lipid goals are as follows: Total cholesterol goal is 200; LDL cholesterol goal is 130; HDL cholesterol goal is 40; Triglyceride goal is 150.     Patient Instructions: 1)  Follow up in 3 months or sooner if needed 2)  Follow up with [facility] if you have not heard from them by next week. Prescriptions: TRIAMCINOLONE ACETONIDE 0.1 % CREA (TRIAMCINOLONE ACETONIDE) One applicaton topically two times a day to affected area for itching  #60gm x 0   Entered and Authorized by:   Lehman Prom FNP   Signed by:   Lehman Prom FNP on 08/31/2009   Method used:   Print then Give to Patient   RxID:   [id number]   Laboratory Results   Urine Tests  Date/Time Received: August 31, 2009 2:32 PM   Routine Urinalysis   Color: lt. yellow Glucose: negative   (Normal Range: Negative) Bilirubin: negative   (Normal Range: Negative) Ketone: negative   (Normal Range: Negative) Spec. Gravity: 1.010   (Normal Range: 1.003-1.035) Blood: negative   (Normal Range: Negative) pH: 6.0   (Normal Range: 5.0-8.0) Protein: negative   (Normal Range: Negative) Urobilinogen: 0.2   (Normal Range: 0-1) Nitrite: negative   (Normal Range:  Negative) Leukocyte Esterace: small   (Normal Range: Negative)    Date/Time Received: August 31, 2009 3:24 PM   Wet Mount/KOH Source: vaginal WBC/hpf: 1-5 Bacteria/hpf: rare Clue cells/hpf: none Yeast/hpf: none Trichomonas/hpf:  none  Stool - Occult Blood Hemmoccult #1: negative Date: 08/31/2009    Laboratory Results   Urine Tests    Routine Urinalysis   Color: lt. yellow Glucose: negative   (Normal Range: Negative) Bilirubin: negative   (Normal Range: Negative) Ketone: negative   (Normal Range: Negative) Spec. Gravity: 1.010   (Normal Range: 1.003-1.035) Blood: negative   (Normal Range: Negative) pH: 6.0   (Normal Range: 5.0-8.0) Protein: negative   (Normal Range: Negative) Urobilinogen: 0.2   (Normal Range: 0-1) Nitrite: negative   (Normal Range: Negative) Leukocyte Esterace: small   (Normal Range: Negative)      Wet Mount Wet Mount KOH: Negative  Stool - Occult Blood Hemmoccult #1: negative

## 2010-04-19 NOTE — Progress Notes (Signed)
Summary: pains in left arm  Phone Note Call from Patient Call back at [phone]   Reason for Call: Talk to Nurse Summary of Call: MARTIN PT. MS Schank CALLED BECAUSE SHE HAS BEEN HAVING PAINS IN HER LEFT ARM SINCE SUNDAY. Initial call taken by: Leodis Rains,  April 11, 2010 2:24 PM  Follow-up for Phone Call        Left message on voicemail for pt. to return call.  Dutch Quint RN  April 11, 2010 3:31 PM  Left message on voicemail for pt. to return call.  Dutch Quint RN  April 12, 2010 4:37 PM  Left message on voicemail for pt. to return call.  Dutch Quint RN  April 13, 2010 5:23 PM   Additional Follow-up for Phone Call Additional follow up Details #1::        pain in left shoulder blade 7/10 for past 6 days, decreased ROM, denies injury, no C.P., SOB, dizziness, nausea or numbness. Intermitant posterior headache once daily which she said she has had for yrs. Advised take flexeril as prescribed, may use ice or heat be sure and move arm to prevent it from getting stiff, no lifting on left side, carry purse on right side. Call if no improvement or gets worse. Additional Follow-up by: Gaylyn Cheers RN,  April 14, 2010 3:45 PM

## 2010-04-20 ENCOUNTER — Telehealth (INDEPENDENT_AMBULATORY_CARE_PROVIDER_SITE_OTHER): Payer: Self-pay | Admitting: Nurse Practitioner

## 2010-04-28 ENCOUNTER — Encounter (INDEPENDENT_AMBULATORY_CARE_PROVIDER_SITE_OTHER): Payer: Self-pay | Admitting: Nurse Practitioner

## 2010-05-01 LAB — CONVERTED CEMR LAB
AST: 19 units/L (ref 0–37)
Albumin: 4.4 g/dL (ref 3.5–5.2)
Alkaline Phosphatase: 95 units/L (ref 39–117)
LDL Cholesterol: 174 mg/dL — ABNORMAL HIGH (ref 0–99)
Potassium: 3.7 meq/L (ref 3.5–5.3)
Sodium: 140 meq/L (ref 135–145)
Total Protein: 7.6 g/dL (ref 6.0–8.3)

## 2010-05-03 NOTE — Progress Notes (Signed)
Summary: Need lab visit  Phone Note Outgoing Call   Summary of Call: advise pt that she has requested refills on her cholesterol meds but it has been over 1 year since fasting labs were done advise pt to schedule a fasting lab visit for lipid, cmp. Initial call taken by: Lehman Prom FNP,  April 20, 2010 1:39 PM  Follow-up for Phone Call        tried calling but no answer..[place] Shannon RMA  April 20, 2010 2:46 PM    appt is scheduled.Marland KitchenMarland KitchenArmenia Shannon  April 25, 2010 11:52 AM

## 2010-05-26 ENCOUNTER — Encounter (INDEPENDENT_AMBULATORY_CARE_PROVIDER_SITE_OTHER): Payer: Self-pay | Admitting: Nurse Practitioner

## 2010-05-30 NOTE — Letter (Signed)
Summary: REQUESTED LAST OV & PPD READING  REQUESTED LAST OV & PPD READING   Imported By: Arta Bruce 05/26/2010 16:05:38  _____________________________________________________________________  External Attachment:    Type:   Image     Comment:   External Document

## 2010-07-25 ENCOUNTER — Other Ambulatory Visit (HOSPITAL_COMMUNITY): Payer: Self-pay | Admitting: Family Medicine

## 2010-07-25 DIAGNOSIS — Z1231 Encounter for screening mammogram for malignant neoplasm of breast: Secondary | ICD-10-CM

## 2010-08-18 ENCOUNTER — Ambulatory Visit (HOSPITAL_COMMUNITY): Payer: Self-pay

## 2010-09-06 ENCOUNTER — Ambulatory Visit (HOSPITAL_COMMUNITY)
Admission: RE | Admit: 2010-09-06 | Discharge: 2010-09-06 | Disposition: A | Discharge status: Home / Self Care | Payer: Self-pay | Source: Ambulatory Visit | Attending: Family Medicine | Admitting: Family Medicine

## 2010-09-06 DIAGNOSIS — Z1231 Encounter for screening mammogram for malignant neoplasm of breast: Secondary | ICD-10-CM | POA: Insufficient documentation

## 2010-11-30 ENCOUNTER — Other Ambulatory Visit: Payer: Self-pay | Admitting: Family Medicine

## 2011-04-13 HISTORY — PX: COLONOSCOPY: SHX174

## 2011-09-06 ENCOUNTER — Encounter (HOSPITAL_COMMUNITY): Payer: Self-pay | Admitting: Emergency Medicine

## 2011-09-06 ENCOUNTER — Emergency Department (HOSPITAL_COMMUNITY)
Admission: EM | Admit: 2011-09-06 | Discharge: 2011-09-06 | Disposition: A | Discharge status: Nursing Home (Includes ICF) | Payer: Self-pay | Source: Home / Self Care | Attending: Emergency Medicine | Admitting: Emergency Medicine

## 2011-09-06 ENCOUNTER — Observation Stay (HOSPITAL_COMMUNITY)
Admission: EM | Admit: 2011-09-06 | Discharge: 2011-09-07 | Disposition: A | Discharge status: Home / Self Care | Payer: Self-pay | Attending: Emergency Medicine | Admitting: Emergency Medicine

## 2011-09-06 ENCOUNTER — Encounter (HOSPITAL_COMMUNITY): Payer: Self-pay

## 2011-09-06 ENCOUNTER — Emergency Department (HOSPITAL_COMMUNITY): Payer: Self-pay

## 2011-09-06 DIAGNOSIS — E7800 Pure hypercholesterolemia, unspecified: Secondary | ICD-10-CM | POA: Insufficient documentation

## 2011-09-06 DIAGNOSIS — R079 Chest pain, unspecified: Principal | ICD-10-CM | POA: Insufficient documentation

## 2011-09-06 DIAGNOSIS — I10 Essential (primary) hypertension: Secondary | ICD-10-CM | POA: Insufficient documentation

## 2011-09-06 DIAGNOSIS — R0602 Shortness of breath: Secondary | ICD-10-CM | POA: Insufficient documentation

## 2011-09-06 DIAGNOSIS — M79609 Pain in unspecified limb: Secondary | ICD-10-CM | POA: Insufficient documentation

## 2011-09-06 DIAGNOSIS — Z8249 Family history of ischemic heart disease and other diseases of the circulatory system: Secondary | ICD-10-CM | POA: Insufficient documentation

## 2011-09-06 DIAGNOSIS — R011 Cardiac murmur, unspecified: Secondary | ICD-10-CM | POA: Insufficient documentation

## 2011-09-06 HISTORY — DX: Pure hypercholesterolemia, unspecified: E78.00

## 2011-09-06 HISTORY — DX: Essential (primary) hypertension: I10

## 2011-09-06 HISTORY — DX: Cardiac murmur, unspecified: R01.1

## 2011-09-06 LAB — BASIC METABOLIC PANEL
BUN: 9 mg/dL (ref 6–23)
CO2: 27 mEq/L (ref 19–32)
Chloride: 100 mEq/L (ref 96–112)
Creatinine, Ser: 0.72 mg/dL (ref 0.50–1.10)

## 2011-09-06 LAB — CBC
HCT: 36.6 % (ref 36.0–46.0)
Hemoglobin: 12.9 g/dL (ref 12.0–15.0)
MCV: 83.6 fL (ref 78.0–100.0)
RBC: 4.38 MIL/uL (ref 3.87–5.11)
WBC: 4.9 10*3/uL (ref 4.0–10.5)

## 2011-09-06 LAB — POCT I-STAT TROPONIN I: Troponin i, poc: 0 ng/mL (ref 0.00–0.08)

## 2011-09-06 MED ORDER — ASPIRIN 81 MG PO CHEW
CHEWABLE_TABLET | ORAL | Status: AC
  Filled 2011-09-06: qty 4

## 2011-09-06 NOTE — ED Notes (Signed)
Pt from [facility], report received.  Pt hemodynamically stable.  CP 2/10 with n/v and arm pain and numbness.  EKG with pt, given 324 ASA at [facility].

## 2011-09-06 NOTE — ED Provider Notes (Signed)
History     CSN: [id number]  Arrival date & time 09/06/11  1845   First MD Initiated Contact with Patient 09/06/11 1847      Chief Complaint  Patient presents with  . Chest Pain    (Consider location/radiation/quality/duration/timing/severity/associated sxs/prior treatment) HPI Comments: Patient presents urgent care tonight complaining of an ongoing left-sided precordial pain with radiation to her shoulder and her left arm with a sensation of numbness. She has felt shortness of breath nauseous and had vomited once or twice. Describes pain as shooting and sharp in character also radiating towards her left arm and shoulder. It started Sunday and has been in intensive points and right now is 2/10. Patient denies any recent respiratory symptoms such as cough, fevers or upper congestion and denies any trauma or injuries.  Patient is a 61 y.o. female presenting with chest pain. The history is provided by the patient.  Chest Pain The chest pain began 3 - 5 days ago. The chest pain is unchanged. At its most intense, the pain is at 8/10. The pain is currently at 2/10. The severity of the pain is moderate. The quality of the pain is described as aching, pressure-like and sharp. The pain radiates to the left shoulder and precordial region. Primary symptoms include shortness of breath, nausea and vomiting. Pertinent negatives for primary symptoms include no fever, no fatigue, no syncope, no wheezing, no palpitations, no dizziness and no altered mental status.  Associated symptoms include numbness.  Pertinent negatives for associated symptoms include no diaphoresis and no weakness.     Past Medical History  Diagnosis Date  . Hypertension   . Hypercholesteremia   . Heart murmur     History reviewed. No pertinent past surgical history.  No family history on file.  History  Substance Use Topics  . Smoking status: Former Games developer  . Smokeless tobacco: Not on file  . Alcohol Use: Yes   occasion    OB History    Grav Para Term Preterm Abortions TAB SAB Ect Mult Living                  Review of Systems  Constitutional: Negative for fever, diaphoresis, activity change and fatigue.  Respiratory: Positive for shortness of breath. Negative for wheezing.   Cardiovascular: Positive for chest pain. Negative for palpitations and syncope.  Gastrointestinal: Positive for nausea and vomiting.  Skin: Negative for pallor, rash and wound.  Neurological: Positive for numbness. Negative for dizziness, tremors and weakness.  Psychiatric/Behavioral: Negative for altered mental status.    Allergies  Sulfamethoxazole w-trimethoprim  Home Medications   Current Outpatient Rx  Name Route Sig Dispense Refill  . CRESTOR PO Oral Take by mouth.    . METOPROLOL TARTRATE PO Oral Take 1 tablet by mouth daily.    . TELMISARTAN 80 MG PO TABS Oral Take 80 mg by mouth daily.      BP 197/93  Pulse 82  Temp 99.7 F (37.6 C) (Oral)  Resp 16  SpO2 100%  Physical Exam  Nursing note and vitals reviewed. Constitutional: She is oriented to person, place, and time. Vital signs are normal. She appears well-developed and well-nourished.  Non-toxic appearance. She does not have a sickly appearance. She does not appear ill. No distress.  HENT:  Head: Normocephalic.  Eyes: Conjunctivae are normal.  Neck: Neck supple.  Cardiovascular: Normal rate, regular rhythm and normal pulses.   Pulmonary/Chest: Effort normal and breath sounds normal. She has no decreased breath sounds. She has  no wheezes. She has no rhonchi. She has no rales. She exhibits no tenderness.  Abdominal: She exhibits no distension. There is no tenderness.  Musculoskeletal: She exhibits no edema and no tenderness.  Neurological: She is alert and oriented to person, place, and time.  Skin: No erythema.    ED Course  Procedures (including critical care time)  Labs Reviewed - No data to display No results found.   1. Chest  pain       MDM  Patient complaining of left precordial chest pain for 3 days associated with shortness of breath nausea vomiting. Patient risk factors for coronary artery disease. EKG at urgent care was unremarkable unchanged compared to EKG done in 2008. Patient has been transferred to the emergency department stable for further evaluation and workup. Patient is noted to be hypertensive.       Jimmie Molly, MD 09/06/11 2014

## 2011-09-06 NOTE — ED Notes (Signed)
Pt reports SOB, CP - described as pressure; pt reports cough, pt reports "strange feeling" to jaw, and arm started to "feel funny"; pt reports symptoms started on Sunday intermittently

## 2011-09-06 NOTE — ED Notes (Addendum)
Pt left ED at 21:20: Pt came back to ED @ 23:39- stated that she had left earlier and came back to get seen; verified with triage RN to undischarge pt; pt updated and in waiting room

## 2011-09-06 NOTE — ED Notes (Signed)
Pt c/o CP onset 3 days ago.  Pt has had SOB/N/V, denies diaphoresis.  Pt states she also has L arm pain and numbness.

## 2011-09-07 ENCOUNTER — Inpatient Hospital Stay (HOSPITAL_COMMUNITY): Admit: 2011-09-07 | Payer: Self-pay

## 2011-09-07 ENCOUNTER — Observation Stay (HOSPITAL_COMMUNITY): Payer: Self-pay

## 2011-09-07 LAB — POCT I-STAT TROPONIN I: Troponin i, poc: 0 ng/mL (ref 0.00–0.08)

## 2011-09-07 MED ORDER — IBUPROFEN 200 MG PO TABS
ORAL_TABLET | ORAL | Status: AC
  Filled 2011-09-07: qty 2

## 2011-09-07 MED ORDER — NITROGLYCERIN 0.4 MG SL SUBL
0.4000 mg | SUBLINGUAL_TABLET | SUBLINGUAL | Status: DC | PRN
  Administered 2011-09-07: 0.4 mg via SUBLINGUAL
  Filled 2011-09-07: qty 25

## 2011-09-07 MED ORDER — ASPIRIN 81 MG PO TABS: 81.0000 mg | ORAL_TABLET | Freq: Every day | ORAL | Status: DC

## 2011-09-07 MED ORDER — ASPIRIN 81 MG PO CHEW: 324.0000 mg | CHEWABLE_TABLET | Freq: Once | ORAL | Status: DC

## 2011-09-07 MED ORDER — IBUPROFEN 400 MG PO TABS
400.0000 mg | ORAL_TABLET | Freq: Once | ORAL | Status: AC
  Administered 2011-09-07: 400 mg via ORAL

## 2011-09-07 MED ORDER — METOPROLOL TARTRATE 25 MG PO TABS
25.0000 mg | ORAL_TABLET | Freq: Once | ORAL | Status: AC
  Administered 2011-09-07: 25 mg via ORAL

## 2011-09-07 MED ORDER — METOPROLOL TARTRATE 25 MG PO TABS
ORAL_TABLET | ORAL | Status: AC
  Filled 2011-09-07: qty 2

## 2011-09-07 MED ORDER — METOPROLOL TARTRATE 25 MG PO TABS
50.0000 mg | ORAL_TABLET | Freq: Once | ORAL | Status: AC
Start: 2011-09-07 — End: 2011-09-07
  Administered 2011-09-07: 50 mg via ORAL

## 2011-09-07 MED ORDER — METOPROLOL TARTRATE 25 MG PO TABS
50.0000 mg | ORAL_TABLET | Freq: Once | ORAL | Status: AC
  Administered 2011-09-07: 50 mg via ORAL
  Filled 2011-09-07: qty 2

## 2011-09-07 MED ORDER — METOPROLOL TARTRATE 1 MG/ML IV SOLN
INTRAVENOUS | Status: AC
  Administered 2011-09-07: 5 mg
  Filled 2011-09-07: qty 5

## 2011-09-07 MED ORDER — METOPROLOL TARTRATE 25 MG PO TABS
12.5000 mg | ORAL_TABLET | Freq: Once | ORAL | Status: DC
  Filled 2011-09-07: qty 1

## 2011-09-07 MED ORDER — IOHEXOL 350 MG/ML SOLN
80.0000 mL | Freq: Once | INTRAVENOUS | Status: AC | PRN
  Administered 2011-09-07: 80 mL via INTRAVENOUS

## 2011-09-07 NOTE — ED Notes (Signed)
Complaining of chest pain which started Sunday. Pressure center chest with SOB.  Stopped Tuesday. Pain restarted yesterday. Rates pain as 5/10.  At worst 9/10. History of heart murmur. States gets SOB a lot. No other cardiac history noted

## 2011-09-07 NOTE — ED Notes (Signed)
HR hanging at 60.

## 2011-09-07 NOTE — Discharge Instructions (Signed)
Aspirin and Your Heart Aspirin affects the way your blood clots and helps "thin" the blood. Aspirin has many uses in heart disease. It may be used as a primary prevention to help reduce the risk of heart related events. It also can be used as a secondary measure to prevent more heart attacks or to prevent additional damage from blood clots.  ASPIRIN MAY HELP IF YOU:  Have had a heart attack or chest pain.   Have undergone open heart surgery such as CABG (Coronary Artery Bypass Surgery).   Have had coronary angioplasty with or without stents.   Have experienced a stroke or TIA (transient ischemic attack).   Have peripheral vascular disease (PAD).   Have chronic heart rhythm problems such as atrial fibrillation.   Are at risk for heart disease.  BEFORE STARTING ASPIRIN Before you start taking aspirin, your caregiver will need to review your medical history. Many things will need to be taken into consideration, such as:  Smoking status.   Blood pressure.   Diabetes.   Gender.   Weight.   Cholesterol level.  ASPIRIN DOSES  Aspirin should only be taken on the advice of your caregiver. Talk to your caregiver about how much aspirin you should take. Aspirin comes in different doses such as:   81 mg.   162 mg.   325 mg.   The aspirin dose you take may be affected by many factors, some of which include:   Your current medications, especially if your are taking blood-thinners or anti-platelet medicine.   Liver function.   Heart disease risk.   Age.   Aspirin comes in two forms:   Non-enteric-coated. This type of aspirin does not have a coating and is absorbed faster. Non-enteric coated aspirin is recommended for patients experiencing chest pain symptoms. This type of aspirin also comes in a chewable form.   Enteric-coated. This means the aspirin has a special coating that releases the medicine very slowly. Enteric-coated aspirin causes less stomach upset. This type of  aspirin should not be chewed or crushed.  ASPIRIN SIDE EFFECTS Daily use of aspirin can increase your risk of serious side effects, some of these include:  Increased bleeding. This can range from a cut that does not stop bleeding to more serious problems such as stomach bleeding or bleeding into the brain (Intracerebral bleeding).   Increased bruising.   Stomach upset.   An allergic reaction such as red, itchy skin.   Increased risk of bleeding when combined with non-steroidal anti-inflammatory medicine (NSAIDS).   Alcohol should be drank in moderation when taking aspirin. Alcohol can increase the risk of stomach bleeding when taken with aspirin.   Aspirin should not be given to children less than 18 years of age due to the association of Reye syndrome. Reye syndrome is a serious illness that can affect the brain and liver. Studies have linked Reye syndrome with aspirin use in children.   People that have nasal polyps have an increased risk of developing an aspirin allergy.  SEEK MEDICAL CARE IF:   You develop an allergic reaction such as:   Hives.   Itchy skin.   Swelling of the lips, tongue or face.   You develop stomach pain.   You have unusual bleeding or bruising.   You have ringing in your ears.  SEEK IMMEDIATE MEDICAL CARE IF:   You have severe chest pain, especially if the pain is crushing or pressure-like and spreads to the arms, back, neck, or jaw. THIS   IS AN EMERGENCY. Do not wait to see if the pain will go away. Get medical help at once. Call your local emergency services (911 in the [place] DO NOT drive yourself to the hospital.   You have stroke-like symptoms such as:   Loss of vision.   Difficulty talking.   Numbness or weakness on one side of your body.   Numbness or weakness in your arm or leg.   Not thinking clearly or feeling confused.   Your bowel movements are bloody, dark red or black in color.   You vomit or cough up blood.   You have blood  in your urine.   You have shortness of breath, coughing or wheezing.  MAKE SURE YOU:   Understand these instructions.   Will monitor your condition.   Seek immediate medical care if necessary.  Document Released: 02/09/2008 Document Revised: 02/15/2011 Document Reviewed: 02/09/2008 [facility] Patient Information 2012 [place], [place].Chest Pain Observation It is often hard to give a specific diagnosis for the cause of chest pain. Your symptoms had a chance of being caused by inadequate oxygen delivery to your heart (angina). Angina that is not treated or evaluated can lead to a heart attack (myocardial infarction, MI) or death. Blood tests, electrocardiograms, and X-rays may have been done to help determine a possible cause of your chest pain. After evaluation and observation, your caregiver has determined that it is unlikely your pain was caused by angina. However, a full evaluation of your pain needs to be completed. You need to follow up with caregivers or diagnostic testing as directed. It is very important to keep your follow-up appointments. Not keeping your follow-up appointments could result in permanent heart damage, disability, or death. If there is any problem keeping your follow-up appointments, you must call your caregiver. HOME CARE INSTRUCTIONS  Due to the slight chance that your pain could be angina, it is important to follow healthy lifestyle habits and follow your caregiver's treatment plan:  Maintain a healthy weight.   Stay physically active and exercise regularly.   Decrease your salt intake.   Eat a diet low in saturated fats and cholesterol. Avoid foods fried in oil or made with fat. Talk to a dietician to learn about heart healthy foods.   Increase your fiber intake by including whole grains, vegetable, and fruits in your diet.   Avoid situations that cause stress, anger, or depression.   Take medication as advised by your caregiver. Report any side effects to your  caregiver. Do not stop medications or adjust the dosages on your own.   Quit smoking. Do not use nicotine patches or gum until you check with your caregiver.   Keep your blood pressure, blood sugar, and cholesterol levels within normal limits.   Limit alcohol intake to no more than 1 drink per day for nonpregnant women and 2 drinks per day for men.   Stop abusing drugs.  SEEK MEDICAL CARE IF: You have severe chest pain or pressure which may include symptoms such as:  Pain or pressure in the arms, neck, jaw, or back.   Profuse sweating.   Feeling sick to your stomach (nauseous).   Feeling short of breath while at rest.   Having a fast or irregular heartbeat.   You have chest pain that does not get better after rest or after taking your usual medicine.   You wake from sleep with chest pain.   You feel dizzy, faint, or experience extreme fatigue.   You notice increasing  shortness of breath during rest, sleep, or with activity.   You are unable to sleep because you cannot breathe.   You develop a frequent cough or you are coughing up blood.   You have severe back or abdominal pain, are nauseated, or throw up (vomit).   You develop severe weakness, dizziness, fainting, or chills.  Any of these symptoms may represent a serious problem that is an emergency. Do not wait to see if the symptoms will go away. Call your local emergency services (911 in the [place] Do not drive yourself to the hospital. MAKE SURE YOU:  Understand these instructions.   Will watch your condition.   Will get help right away if you are not doing well or get worse.  Document Released: 03/31/2010 Document Revised: 02/15/2011 Document Reviewed: 03/31/2010 [facility] Patient Information 2012 [place], [place].

## 2011-09-07 NOTE — ED Provider Notes (Signed)
7:44 AM Patient is in CDU under observation, chest pain protocol.  This is a shared visit with Dr Dierdre Highman.  No acute events overnight, pt reports she did not sleep much but denies any recurrence of chest pain.  States all results thus far have been discussed with her.  On exam, pt is A&Ox4, NAD, RRR, no m/r/g, CTAB, abd soft, NT, extremities without edema, distal pulses intact and equal bilaterally.  Plan is for coronary CT this morning.  Patient verbalizes understanding and agrees with plan.  Will continue to follow.   1:34 PM Nurse (Melody) continues to work with Dr Reche Dixon to lower patient's HR.  Has been in the mid to low 60s.    3:29 PM  Pt signed out to Marlon Pel, PA-C, who assumes care of patient at change of shift.  Patient has been to CT, awaiting results.    Call from radiology received by Marlon Pel, PA-C, who will discuss results with Dr Lynelle Doctor and patient and determine disposition.    Results for orders placed during the hospital encounter of 09/06/11  CBC      Component Value Range   WBC 4.9  4.0 - 10.5 K/uL   RBC 4.38  3.87 - 5.11 MIL/uL   Hemoglobin 12.9  12.0 - 15.0 g/dL   HCT 16.1  09.6 - 04.5 %   MCV 83.6  78.0 - 100.0 fL   MCH 29.5  26.0 - 34.0 pg   MCHC 35.2  30.0 - 36.0 g/dL   RDW 40.9  81.1 - 91.4 %   Platelets 253  150 - 400 K/uL  BASIC METABOLIC PANEL      Component Value Range   Sodium 139  135 - 145 mEq/L   Potassium 3.5  3.5 - 5.1 mEq/L   Chloride 100  96 - 112 mEq/L   CO2 27  19 - 32 mEq/L   Glucose, Bld 93  70 - 99 mg/dL   BUN 9  6 - 23 mg/dL   Creatinine, Ser 7.82  0.50 - 1.10 mg/dL   Calcium 9.8  8.4 - 95.6 mg/dL   GFR calc non Af Amer >90  >90 mL/min   GFR calc Af Amer >90  >90 mL/min  POCT I-STAT TROPONIN I      Component Value Range   Troponin i, poc 0.00  0.00 - 0.08 ng/mL   Comment 3           POCT I-STAT TROPONIN I      Component Value Range   Troponin i, poc 0.00  0.00 - 0.08 ng/mL   Comment 3           POCT I-STAT TROPONIN I       Component Value Range   Troponin i, poc 0.00  0.00 - 0.08 ng/mL   Comment 3            Dg Chest 2 View  09/06/2011  *RADIOLOGY REPORT*  Clinical Data: Chest pain with shortness of breath and left arm pain.  Hypertension.  CHEST - 2 VIEW  Comparison: None  Findings: Normal heart size with clear lung fields.  No effusion or pneumothorax.  No acute bony abnormality.  Moderate degenerative change thoracic spine.  Prior rotator cuff repair right shoulder.  IMPRESSION: No active cardiopulmonary disease.  Original Report Authenticated By: Elsie Stain, M.D.   Ct Heart Morp W/cta Cor W/score W/ca W/cm &/or Wo/cm  09/07/2011  *RADIOLOGY REPORT*  INDICATION:  Chest pain.  CT  ANGIOGRAPHY OF THE HEART, CORONARY ARTERY, STRUCTURE, AND MORPHOLOGY  CONTRAST: 80mL OMNIPAQUE IOHEXOL 350 MG/ML SOLN  COMPARISON:  None  TECHNIQUE:  CT angiography of the coronary vessels was performed on a 256 channel system using prospective ECG gating.  A scout and noncontrast exam (for calcium scoring) were performed.  Circulation time was measured using a test bolus.  Coronary CTA was performed with sub mm slice collimation during portions of the cardiac cycle after prior injection of iodinated contrast.  Imaging post processing was performed on an independent workstation creating multiplanar and 3-D images, and quantitative analysis of the heart and coronary arteries.  Note that this exam targets the heart and the chest was not imaged in its entirety.  PREMEDICATION: Lopressor 125 mg, P.O. Lopressor 5 mg, IV Nitroglycerin 0.4 mcg, sublingual.  FINDINGS: Technical quality:  Good.  Heart rate:  53  CORONARY ARTERIES: Left main coronary artery:  Long vessel of average caliber.  No intrinsic disease or stenosis.  Gives rise to the left anterior descending artery and left circumflex artery. Left anterior descending:  Gives rise to septal perforator branches and at least three small average size diagonal branches.  No intrinsic disease or  stenosis. Left circumflex:  Gives rise to a large proximal first obtuse marginal branch.  No intrinsic disease or stenosis. Right coronary artery:  Minimal calcified plaque in the proximal to mid RCA.  No significant stenosis. Posterior descending artery:  Arises from the right coronary artery.  No intrinsic disease. Dominance:  Right.  CORONARY CALCIUM: Total Agatston Score:  2.48 MESA database percentile:  57  CARDIAC MEASUREMENTS: Interventricular septum (6 - 12 mm):  10 mm. LV posterior wall (6 - 12 mm):  11 mm. LV diameter in diastole (35 - 52 mm):  32 mm.  AORTA AND PULMONARY MEASUREMENTS: Aortic root (21 - 40 mm):             21 mm  at the annulus             27 mm  at the sinuses of Valsalva             24 mm  at the sinotubular junction Ascending aorta ( <  40 mm):  36 mm Descending aorta ( <  40 mm):  24 mm Main pulmonary artery:  ( <  30 mm):  25 mm  EXTRACARDIAC FINDINGS: Dependent atelectasis in the lungs bilaterally.  Densities noted in the lingula and left base could represent areas of scarring.  No pleural effusions.  No adenopathy in the visualized inferior mediastinum or hila.  Visualized chest wall soft tissues and upper abdomen unremarkable.  Degenerative changes in the thoracic spine.  IMPRESSION:  1.  Minimal coronary artery disease.  The patient's total coronary artery calcium score is 2.48, which is 57 percentile for patient's matched age and gender. 2.  Minimal calcified plaque in the proximal to mid right coronary artery.  No significant stenosis. 3.  Right coronary artery dominance.  Report was called to Tiffany at 3:20 p.m. on 09/07/2011.  Original Report Authenticated By: Cyndie Chime, M.D.      Dillard Cannon Ethan, [place] 09/07/11 [phone]

## 2011-09-07 NOTE — ED Notes (Addendum)
Spoke with tech in radiology. Left message for Dr Reche Dixon. HR remains from 60-64.

## 2011-09-07 NOTE — ED Notes (Signed)
Pt transported to CT by RN.

## 2011-09-07 NOTE — ED Notes (Signed)
Dr Reche Dixon called- HR 59-70 regularly. 25mg  Metoprolol ordered.

## 2011-09-07 NOTE — ED Provider Notes (Signed)
History     CSN: [id number]  Arrival date & time 09/06/11  1927   None     Chief Complaint  Patient presents with  . Chest Pain    (Consider location/radiation/quality/duration/timing/severity/associated sxs/prior treatment) HPI History provided by patient. Substernal and left-sided chest heaviness and pressure with associated shortness of breath occurred 3 days ago and lasted most of the day. Had some mild pains the following day. Pain returned today and patient decided to be evaluated. She went to the urgent care Center and was sent here for further evaluation. A strong family history of coronary artery disease her father who died of heart related issues and she believes began having heart problems a young age. Patient is treated for hypertension and high cholesterol without history of diabetes or known heart problem no history of chest pain. Was moderate in severity and currently is pain-free. Aspirin prior to arrival. No known aggravating or alleviating factors. Did have chest pain while at rest. No leg pain or leg swelling. No history of DVT or PE. No back pain. No cough fever or chills. No recent illness otherwise. Past Medical History  Diagnosis Date  . Hypertension   . Hypercholesteremia   . Heart murmur     History reviewed. No pertinent past surgical history.  History reviewed. No pertinent family history.  History  Substance Use Topics  . Smoking status: Former Games developer  . Smokeless tobacco: Not on file  . Alcohol Use: Yes     occasion    OB History    Grav Para Term Preterm Abortions TAB SAB Ect Mult Living                  Review of Systems  Constitutional: Negative for fever and chills.  HENT: Negative for neck pain and neck stiffness.   Eyes: Negative for pain.  Respiratory: Positive for shortness of breath.   Cardiovascular: Positive for chest pain.  Gastrointestinal: Negative for abdominal pain.  Genitourinary: Negative for dysuria.  Musculoskeletal:  Negative for back pain.  Skin: Negative for rash.  Neurological: Negative for headaches.  All other systems reviewed and are negative.    Allergies  Sulfamethoxazole w-trimethoprim  Home Medications   Current Outpatient Rx  Name Route Sig Dispense Refill  . METOPROLOL TARTRATE PO Oral Take 1 tablet by mouth daily.    . CRESTOR PO Oral Take by mouth.    . TELMISARTAN 80 MG PO TABS Oral Take 80 mg by mouth daily.      BP 146/71  Pulse 60  Temp 98.5 F (36.9 C) (Oral)  Resp 21  SpO2 98%  Physical Exam  Constitutional: She is oriented to person, place, and time. She appears well-developed and well-nourished.  HENT:  Head: Normocephalic and atraumatic.  Eyes: Conjunctivae and EOM are normal. Pupils are equal, round, and reactive to light.  Neck: Trachea normal. Neck supple. No thyromegaly present.  Cardiovascular: Normal rate, regular rhythm, S1 normal, S2 normal and normal pulses.     No systolic murmur is present   No diastolic murmur is present  Pulses:      Radial pulses are 2+ on the right side, and 2+ on the left side.  Pulmonary/Chest: Effort normal and breath sounds normal. She has no wheezes. She has no rhonchi. She has no rales. She exhibits no tenderness.  Abdominal: Soft. Normal appearance and bowel sounds are normal. There is no tenderness. There is no CVA tenderness and negative Murphy's sign.  Musculoskeletal:  BLE:s Calves nontender, no cords or erythema, negative Homans sign  Neurological: She is alert and oriented to person, place, and time. She has normal strength. No cranial nerve deficit or sensory deficit. GCS eye subscore is 4. GCS verbal subscore is 5. GCS motor subscore is 6.  Skin: Skin is warm and dry. No rash noted. She is not diaphoretic.  Psychiatric: Her speech is normal.       Cooperative and appropriate    ED Course  Procedures (including critical care time)  Results for orders placed during the hospital encounter of 09/06/11  CBC        Component Value Range   WBC 4.9  4.0 - 10.5 K/uL   RBC 4.38  3.87 - 5.11 MIL/uL   Hemoglobin 12.9  12.0 - 15.0 g/dL   HCT 40.9  81.1 - 91.4 %   MCV 83.6  78.0 - 100.0 fL   MCH 29.5  26.0 - 34.0 pg   MCHC 35.2  30.0 - 36.0 g/dL   RDW 78.2  95.6 - 21.3 %   Platelets 253  150 - 400 K/uL  BASIC METABOLIC PANEL      Component Value Range   Sodium 139  135 - 145 mEq/L   Potassium 3.5  3.5 - 5.1 mEq/L   Chloride 100  96 - 112 mEq/L   CO2 27  19 - 32 mEq/L   Glucose, Bld 93  70 - 99 mg/dL   BUN 9  6 - 23 mg/dL   Creatinine, Ser 0.86  0.50 - 1.10 mg/dL   Calcium 9.8  8.4 - 57.8 mg/dL   GFR calc non Af Amer >90  >90 mL/min   GFR calc Af Amer >90  >90 mL/min  POCT I-STAT TROPONIN I      Component Value Range   Troponin i, poc 0.00  0.00 - 0.08 ng/mL   Comment 3            Dg Chest 2 View  09/06/2011  *RADIOLOGY REPORT*  Clinical Data: Chest pain with shortness of breath and left arm pain.  Hypertension.  CHEST - 2 VIEW  Comparison: None  Findings: Normal heart size with clear lung fields.  No effusion or pneumothorax.  No acute bony abnormality.  Moderate degenerative change thoracic spine.  Prior rotator cuff repair right shoulder.  IMPRESSION: No active cardiopulmonary disease.  Original Report Authenticated By: Elsie Stain, M.D.    Date: 09/07/2011  Rate: 60  Rhythm: normal sinus rhythm  QRS Axis: normal  Intervals: normal  ST/T Wave abnormalities: normal  Conduction Disutrbances:none  Narrative Interpretation:   Old EKG Reviewed: none available  3:21 AM Chest pain-free in the emergency department. Serial exams unchanged  MDM   Chest pain concerning for ACS. Low risk by TIMI  Nurse's notes reviewed. Aspirin provided. Pain-free holding nitroglycerin. Imaging and labs obtained and reviewed as above. Plan CDU observation chest pain protocol.  BMI 30, SET UP FOR CCT    Sunnie Nielsen, MD 09/07/11 [phone]

## 2011-09-07 NOTE — ED Provider Notes (Signed)
Patient placed on CDU CPP. Pt hand off to me from [facility], [place]. Pt coronary CT scan results show that she has no significant stenosis.   Ct Heart Morp W/cta Cor W/score W/ca W/cm &/or Wo/cm  09/07/2011  *RADIOLOGY REPORT*  INDICATION:  Chest pain.  CT ANGIOGRAPHY OF THE HEART, CORONARY ARTERY, STRUCTURE, AND MORPHOLOGY  CONTRAST: 80mL OMNIPAQUE IOHEXOL 350 MG/ML SOLN  COMPARISON:  None  TECHNIQUE:  CT angiography of the coronary vessels was performed on a 256 channel system using prospective ECG gating.  A scout and noncontrast exam (for calcium scoring) were performed.  Circulation time was measured using a test bolus.  Coronary CTA was performed with sub mm slice collimation during portions of the cardiac cycle after prior injection of iodinated contrast.  Imaging post processing was performed on an independent workstation creating multiplanar and 3-D images, and quantitative analysis of the heart and coronary arteries.  Note that this exam targets the heart and the chest was not imaged in its entirety.  PREMEDICATION: Lopressor 125 mg, P.O. Lopressor 5 mg, IV Nitroglycerin 0.4 mcg, sublingual.  FINDINGS: Technical quality:  Good.  Heart rate:  53  CORONARY ARTERIES: Left main coronary artery:  Long vessel of average caliber.  No intrinsic disease or stenosis.  Gives rise to the left anterior descending artery and left circumflex artery. Left anterior descending:  Gives rise to septal perforator branches and at least three small average size diagonal branches.  No intrinsic disease or stenosis. Left circumflex:  Gives rise to a large proximal first obtuse marginal branch.  No intrinsic disease or stenosis. Right coronary artery:  Minimal calcified plaque in the proximal to mid RCA.  No significant stenosis. Posterior descending artery:  Arises from the right coronary artery.  No intrinsic disease. Dominance:  Right.  CORONARY CALCIUM: Total Agatston Score:  2.48 MESA database percentile:  57  CARDIAC  MEASUREMENTS: Interventricular septum (6 - 12 mm):  10 mm. LV posterior wall (6 - 12 mm):  11 mm. LV diameter in diastole (35 - 52 mm):  32 mm.  AORTA AND PULMONARY MEASUREMENTS: Aortic root (21 - 40 mm):             21 mm  at the annulus             27 mm  at the sinuses of Valsalva             24 mm  at the sinotubular junction Ascending aorta ( <  40 mm):  36 mm Descending aorta ( <  40 mm):  24 mm Main pulmonary artery:  ( <  30 mm):  25 mm  EXTRACARDIAC FINDINGS: Dependent atelectasis in the lungs bilaterally.  Densities noted in the lingula and left base could represent areas of scarring.  No pleural effusions.  No adenopathy in the visualized inferior mediastinum or hila.  Visualized chest wall soft tissues and upper abdomen unremarkable.  Degenerative changes in the thoracic spine.  IMPRESSION:  1.  Minimal coronary artery disease.  The patient's total coronary artery calcium score is 2.48, which is 57 percentile for patient's matched age and gender. 2.  Minimal calcified plaque in the proximal to mid right coronary artery.  No significant stenosis. 3.  Right coronary artery dominance.  Report was called to Annetta Deiss at 3:20 p.m. on 09/07/2011.  Original Report Authenticated By: Cyndie Chime, M.D.   The patient is currently taking  1. Metoprolol 2.Micardis 3. Rosuvastatin  I have added 81mg   Aspirin to her regimine. I have asked that she follow-up with her PCP, Jesse Fall. NP with [facility] to follow-up of her ER visit and CP obs results.  I have discussed results and plan with Dr. Fonnie Jarvis.  Pt has been advised of the symptoms that warrant their return to the ED. Patient has voiced understanding and has agreed to follow-up with the PCP or specialist.   Dorthula Matas, PA 09/07/11 [phone]

## 2011-09-07 NOTE — ED Notes (Addendum)
Patient resting well. Dr Reche Dixon ordered 50mg  Metoprolol to bring HR down for CT.

## 2011-09-07 NOTE — ED Provider Notes (Signed)
Medical screening examination/treatment/procedure(s) were conducted as a shared visit with non-physician practitioner(s) and myself.  I personally evaluated the patient during the encounter  Sunnie Nielsen, MD 09/07/11 2258

## 2011-09-15 NOTE — ED Provider Notes (Signed)
Medical screening examination/treatment/procedure(s) were performed by non-physician practitioner and as supervising physician I was immediately available for consultation/collaboration.  Hurman Horn, MD 09/15/11 1228

## 2011-09-23 ENCOUNTER — Emergency Department (HOSPITAL_COMMUNITY): Payer: Self-pay

## 2011-09-23 ENCOUNTER — Inpatient Hospital Stay (HOSPITAL_COMMUNITY)
Admission: EM | Admit: 2011-09-23 | Discharge: 2011-09-27 | DRG: 176 | Disposition: A | Discharge status: Home / Self Care | Payer: Self-pay | Attending: Internal Medicine | Admitting: Internal Medicine

## 2011-09-23 ENCOUNTER — Encounter (HOSPITAL_COMMUNITY): Payer: Self-pay | Admitting: *Deleted

## 2011-09-23 DIAGNOSIS — J301 Allergic rhinitis due to pollen: Secondary | ICD-10-CM

## 2011-09-23 DIAGNOSIS — G479 Sleep disorder, unspecified: Secondary | ICD-10-CM

## 2011-09-23 DIAGNOSIS — Z79899 Other long term (current) drug therapy: Secondary | ICD-10-CM

## 2011-09-23 DIAGNOSIS — R42 Dizziness and giddiness: Secondary | ICD-10-CM

## 2011-09-23 DIAGNOSIS — Z7982 Long term (current) use of aspirin: Secondary | ICD-10-CM

## 2011-09-23 DIAGNOSIS — M940 Chondrocostal junction syndrome [Tietze]: Secondary | ICD-10-CM

## 2011-09-23 DIAGNOSIS — F411 Generalized anxiety disorder: Secondary | ICD-10-CM

## 2011-09-23 DIAGNOSIS — I5032 Chronic diastolic (congestive) heart failure: Secondary | ICD-10-CM | POA: Diagnosis present

## 2011-09-23 DIAGNOSIS — Z87891 Personal history of nicotine dependence: Secondary | ICD-10-CM

## 2011-09-23 DIAGNOSIS — R079 Chest pain, unspecified: Secondary | ICD-10-CM | POA: Diagnosis present

## 2011-09-23 DIAGNOSIS — H905 Unspecified sensorineural hearing loss: Secondary | ICD-10-CM

## 2011-09-23 DIAGNOSIS — R791 Abnormal coagulation profile: Secondary | ICD-10-CM | POA: Diagnosis present

## 2011-09-23 DIAGNOSIS — I2699 Other pulmonary embolism without acute cor pulmonale: Secondary | ICD-10-CM

## 2011-09-23 DIAGNOSIS — R9431 Abnormal electrocardiogram [ECG] [EKG]: Secondary | ICD-10-CM

## 2011-09-23 DIAGNOSIS — F1910 Other psychoactive substance abuse, uncomplicated: Secondary | ICD-10-CM

## 2011-09-23 DIAGNOSIS — E785 Hyperlipidemia, unspecified: Secondary | ICD-10-CM

## 2011-09-23 DIAGNOSIS — F3289 Other specified depressive episodes: Secondary | ICD-10-CM

## 2011-09-23 DIAGNOSIS — Z9189 Other specified personal risk factors, not elsewhere classified: Secondary | ICD-10-CM

## 2011-09-23 DIAGNOSIS — F609 Personality disorder, unspecified: Secondary | ICD-10-CM

## 2011-09-23 DIAGNOSIS — H9319 Tinnitus, unspecified ear: Secondary | ICD-10-CM

## 2011-09-23 DIAGNOSIS — F329 Major depressive disorder, single episode, unspecified: Secondary | ICD-10-CM

## 2011-09-23 DIAGNOSIS — I10 Essential (primary) hypertension: Secondary | ICD-10-CM

## 2011-09-23 DIAGNOSIS — I509 Heart failure, unspecified: Secondary | ICD-10-CM | POA: Diagnosis present

## 2011-09-23 DIAGNOSIS — G43909 Migraine, unspecified, not intractable, without status migrainosus: Secondary | ICD-10-CM

## 2011-09-23 DIAGNOSIS — H8110 Benign paroxysmal vertigo, unspecified ear: Secondary | ICD-10-CM

## 2011-09-23 DIAGNOSIS — R110 Nausea: Secondary | ICD-10-CM

## 2011-09-23 DIAGNOSIS — R509 Fever, unspecified: Secondary | ICD-10-CM

## 2011-09-23 DIAGNOSIS — Z9889 Other specified postprocedural states: Secondary | ICD-10-CM

## 2011-09-23 HISTORY — DX: Other pulmonary embolism without acute cor pulmonale: I26.99

## 2011-09-23 LAB — URINALYSIS, ROUTINE W REFLEX MICROSCOPIC
Bilirubin Urine: NEGATIVE
Hgb urine dipstick: NEGATIVE
Nitrite: NEGATIVE
Specific Gravity, Urine: >1.046 — ABNORMAL HIGH (ref 1.005–1.030)
pH: 6 (ref 5.0–8.0)

## 2011-09-23 LAB — COMPREHENSIVE METABOLIC PANEL
ALT: 12 U/L (ref 0–35)
AST: 12 U/L (ref 0–37)
Calcium: 9.7 mg/dL (ref 8.4–10.5)
Sodium: 135 mEq/L (ref 135–145)
Total Protein: 8.2 g/dL (ref 6.0–8.3)

## 2011-09-23 LAB — CBC WITH DIFFERENTIAL/PLATELET
Basophils Absolute: 0 10*3/uL (ref 0.0–0.1)
Basophils Relative: 0 % (ref 0–1)
Eosinophils Absolute: 0.1 10*3/uL (ref 0.0–0.7)
Eosinophils Relative: 1 % (ref 0–5)
Lymphocytes Relative: 11 % — ABNORMAL LOW (ref 12–46)
MCH: 29.3 pg (ref 26.0–34.0)
MCV: 82.7 fL (ref 78.0–100.0)
Platelets: 289 10*3/uL (ref 150–400)
RDW: 12.4 % (ref 11.5–15.5)
WBC: 8.9 10*3/uL (ref 4.0–10.5)

## 2011-09-23 LAB — D-DIMER, QUANTITATIVE: D-Dimer, Quant: 1.15 ug/mL-FEU — ABNORMAL HIGH (ref 0.00–0.48)

## 2011-09-23 LAB — PROTIME-INR: Prothrombin Time: 13.8 seconds (ref 11.6–15.2)

## 2011-09-23 LAB — APTT: aPTT: 37 seconds (ref 24–37)

## 2011-09-23 MED ORDER — DOCUSATE SODIUM 100 MG PO CAPS
100.0000 mg | ORAL_CAPSULE | Freq: Every day | ORAL | Status: DC | PRN
  Administered 2011-09-24: 100 mg via ORAL
  Filled 2011-09-23: qty 1

## 2011-09-23 MED ORDER — SODIUM CHLORIDE 0.9 % IV SOLN
Freq: Once | INTRAVENOUS | Status: AC
  Administered 2011-09-23: 13:00:00 via INTRAVENOUS

## 2011-09-23 MED ORDER — SODIUM CHLORIDE 0.9 % IJ SOLN: 3.0000 mL | INTRAMUSCULAR | Status: DC | PRN

## 2011-09-23 MED ORDER — IOHEXOL 350 MG/ML SOLN
100.0000 mL | Freq: Once | INTRAVENOUS | Status: AC | PRN
  Administered 2011-09-23: 100 mL via INTRAVENOUS

## 2011-09-23 MED ORDER — ONDANSETRON HCL 4 MG/2ML IJ SOLN: 4.0000 mg | Freq: Four times a day (QID) | INTRAMUSCULAR | Status: DC | PRN

## 2011-09-23 MED ORDER — SODIUM CHLORIDE 0.9 % IJ SOLN
3.0000 mL | Freq: Two times a day (BID) | INTRAMUSCULAR | Status: DC
  Administered 2011-09-24 – 2011-09-25 (×2): 3 mL via INTRAVENOUS

## 2011-09-23 MED ORDER — ONDANSETRON HCL 4 MG PO TABS: 4.0000 mg | ORAL_TABLET | Freq: Four times a day (QID) | ORAL | Status: DC | PRN

## 2011-09-23 MED ORDER — OXYCODONE HCL 5 MG PO TABS
5.0000 mg | ORAL_TABLET | ORAL | Status: DC | PRN
  Administered 2011-09-23 – 2011-09-27 (×12): 5 mg via ORAL
  Filled 2011-09-23 (×11): qty 1

## 2011-09-23 MED ORDER — SODIUM CHLORIDE 0.9 % IJ SOLN
3.0000 mL | Freq: Two times a day (BID) | INTRAMUSCULAR | Status: DC
  Administered 2011-09-24 – 2011-09-27 (×4): 3 mL via INTRAVENOUS

## 2011-09-23 MED ORDER — IRBESARTAN 75 MG PO TABS
75.0000 mg | ORAL_TABLET | Freq: Every day | ORAL | Status: DC
  Administered 2011-09-24 – 2011-09-27 (×4): 75 mg via ORAL
  Filled 2011-09-23 (×4): qty 1

## 2011-09-23 MED ORDER — SODIUM CHLORIDE 0.9 % IV SOLN
INTRAVENOUS | Status: DC
  Administered 2011-09-23 – 2011-09-26 (×6): via INTRAVENOUS

## 2011-09-23 MED ORDER — ACETAMINOPHEN 650 MG RE SUPP: 650.0000 mg | Freq: Four times a day (QID) | RECTAL | Status: DC | PRN

## 2011-09-23 MED ORDER — ROSUVASTATIN CALCIUM 10 MG PO TABS
10.0000 mg | ORAL_TABLET | Freq: Every day | ORAL | Status: DC
  Administered 2011-09-23 – 2011-09-27 (×5): 10 mg via ORAL
  Filled 2011-09-23 (×5): qty 1

## 2011-09-23 MED ORDER — WARFARIN - PHARMACIST DOSING INPATIENT: Freq: Every day | Status: DC

## 2011-09-23 MED ORDER — SODIUM CHLORIDE 0.9 % IV SOLN: 250.0000 mL | INTRAVENOUS | Status: DC | PRN

## 2011-09-23 MED ORDER — METOPROLOL TARTRATE 25 MG PO TABS
25.0000 mg | ORAL_TABLET | Freq: Two times a day (BID) | ORAL | Status: DC
  Administered 2011-09-24 – 2011-09-27 (×7): 25 mg via ORAL
  Filled 2011-09-23 (×8): qty 1

## 2011-09-23 MED ORDER — ENOXAPARIN SODIUM 80 MG/0.8ML ~~LOC~~ SOLN
80.0000 mg | Freq: Once | SUBCUTANEOUS | Status: AC
  Administered 2011-09-23: 80 mg via SUBCUTANEOUS
  Filled 2011-09-23: qty 0.8

## 2011-09-23 MED ORDER — ONDANSETRON HCL 4 MG/2ML IJ SOLN
4.0000 mg | Freq: Once | INTRAMUSCULAR | Status: AC
Start: 2011-09-23 — End: 2011-09-23
  Administered 2011-09-23: 4 mg via INTRAVENOUS
  Filled 2011-09-23: qty 2

## 2011-09-23 MED ORDER — ENOXAPARIN SODIUM 80 MG/0.8ML ~~LOC~~ SOLN
1.0000 mg/kg | Freq: Two times a day (BID) | SUBCUTANEOUS | Status: DC
  Administered 2011-09-24 – 2011-09-27 (×7): 80 mg via SUBCUTANEOUS
  Filled 2011-09-23 (×10): qty 0.8

## 2011-09-23 MED ORDER — WARFARIN SODIUM 7.5 MG PO TABS
7.5000 mg | ORAL_TABLET | Freq: Once | ORAL | Status: AC
  Administered 2011-09-23: 7.5 mg via ORAL
  Filled 2011-09-23: qty 1

## 2011-09-23 MED ORDER — OXYCODONE HCL 5 MG PO TABS
ORAL_TABLET | ORAL | Status: AC
  Filled 2011-09-23: qty 1

## 2011-09-23 MED ORDER — WHITE PETROLATUM GEL
Status: AC
  Administered 2011-09-23: 1
  Filled 2011-09-23: qty 5

## 2011-09-23 MED ORDER — MORPHINE SULFATE 4 MG/ML IJ SOLN
4.0000 mg | Freq: Once | INTRAMUSCULAR | Status: AC
  Administered 2011-09-23: 4 mg via INTRAVENOUS
  Filled 2011-09-23: qty 1

## 2011-09-23 MED ORDER — MOXIFLOXACIN HCL IN NACL 400 MG/250ML IV SOLN
400.0000 mg | Freq: Once | INTRAVENOUS | Status: AC
  Administered 2011-09-23: 400 mg via INTRAVENOUS
  Filled 2011-09-23: qty 250

## 2011-09-23 MED ORDER — ACETAMINOPHEN 325 MG PO TABS
650.0000 mg | ORAL_TABLET | Freq: Four times a day (QID) | ORAL | Status: DC | PRN
  Administered 2011-09-25: 650 mg via ORAL
  Filled 2011-09-23: qty 2

## 2011-09-23 NOTE — Progress Notes (Signed)
Received to room 1435 at 1845. Oriented to room. prn for pain given. Dinner tray given. Blood drawn per lab. Pt aware need urine specimen.

## 2011-09-23 NOTE — H&P (Signed)
[facility] History and Physical  ALEXAS BASULTO NGE:[id number] DOB: Dec 16, 1950 DOA: 09/23/2011  Referring physician: ED Dr. Fredderick Phenix PCP: Lehman Prom, NP   Chief Complaint: Pain at right chest and cough  HPI:  Pt is a 61 y/o nonsmoker AAF with history of hypertension and hyperlipidemia that presented to the ED after developing persistent right sided chest discomfort described as sharp pain.  Reportedly this had been going on since last Friday evening.  She denies any trauma, falls, or recent strenuous activity.  Mentions that it occurred all of a sudden.  Has persisted since then and has been associated with productive cough which has been clear.  Denies any fever or chills associated with her current symptoms.  Has noticed some increase shortness of breath as well.  Nothing she knows of makes it better.  Denies any prolonged trips or recent surgeries.   In the Ed had chest x ray which showed a mild right lower lobe infiltrate which was suspicious for pneumonia and follow up CT angiogram was ordered given hypoxia and elevated D dimer.  CT showed acute BL segmental pulmonary emboli and patient was subsequently started on Lovenox.  She was started on avelox given her initial chest x ray but after developing rash after it was started it was discontinued soon afterwards.   We were asked to evaluate patient for admission orders given acute PE.  Review of Systems:  Patient denies any nausea, emesis, abd discomfort, fevers, chills, headaches, blurred vision, focal neurological weakness, constipation, diarrhea, SI, joint aches, dysuria, BRBPR, rashes + cough, + pain at right chest, + easy bruising. + poor oral intake, + nausea  Past Medical History  Diagnosis Date  . Hypertension   . Hypercholesteremia   . Heart murmur    History reviewed. No pertinent past surgical history. Social History:  reports that she has quit smoking. She does not have any smokeless tobacco history on file. She  reports that she drinks about .6 ounces of alcohol per week. She reports that she does not use illicit drugs.  Family history Significant for colon cancer and hypertension.  Patient denies any family history of conditions related to increase clotting.  Allergies  Allergen Reactions  . Sulfamethoxazole W-Trimethoprim     REACTION: red rash    History reviewed. No pertinent family history.  Prior to Admission medications   Medication Sig Start Date End Date Taking? Authorizing Provider  aspirin 81 MG tablet Take 1 tablet (81 mg total) by mouth daily. 09/07/11 09/06/12 Yes Tiffany Irine Seal, PA  METOPROLOL TARTRATE PO Take 1 tablet by mouth 2 (two) times daily.    Yes Historical Provider, MD  Rosuvastatin Calcium (CRESTOR PO) Take 1 tablet by mouth at bedtime.    Yes Historical Provider, MD  telmisartan (MICARDIS) 80 MG tablet Take 80 mg by mouth daily.   Yes Historical Provider, MD   Physical Exam: Filed Vitals:   09/23/11 1530 09/23/11 1600 09/23/11 1608 09/23/11 1615  BP: 127/78 127/77 127/77 125/72  Pulse: 98 98 98 86  Temp:   98.6 F (37 C)   TempSrc:   Oral   Resp: 21 22  19   SpO2: 99% 98% 96% 90%     General:  Pt in NAD, A and O x 3  Eyes: EOMI, PERRLA  ENT: WNL's  Neck:thyroid is not enlarged, no lymphadenopathy  Cardiovascular: RRR, No MRG  Respiratory: Mild rhales at bases, no wheezes  Abdomen: Soft, negative murphy's sign, no suprapubic discomfort  Skin: no rashes,  warm, dry  Musculoskeletal: No pain with movement of extremities  Psychiatric: mood and affect appropriate  Neurologic: None focal, answers questions appropriately  Labs on Admission:  Basic Metabolic Panel:  Lab 09/23/11 5621  NA 135  K 3.2*  CL 98  CO2 24  GLUCOSE 170*  BUN 8  CREATININE 0.75  CALCIUM 9.7  MG --  PHOS --   Liver Function Tests:  Lab 09/23/11 1241  AST 12  ALT 12  ALKPHOS 99  BILITOT 1.3*  PROT 8.2  ALBUMIN 3.9   No results found for this basename:  LIPASE:5,AMYLASE:5 in the last 168 hours No results found for this basename: AMMONIA:5 in the last 168 hours CBC:  Lab 09/23/11 1241  WBC 8.9  NEUTROABS 7.2  HGB 13.9  HCT 39.3  MCV 82.7  PLT 289   Cardiac Enzymes: No results found for this basename: CKTOTAL:5,CKMB:5,CKMBINDEX:5,TROPONINI:5 in the last 168 hours BNP: No components found with this basename: POCBNP:5 CBG: No results found for this basename: GLUCAP:5 in the last 168 hours  Radiological Exams on Admission: Dg Chest 2 View  09/23/2011  *RADIOLOGY REPORT*  Clinical Data: Cough.  Right-sided chest pain.  CHEST - 2 VIEW  Comparison: 09/06/2011  Findings: Mild right lower lobe infiltrate is new since previous study, and suspicious for pneumonia.  No evidence of pleural effusion.  Left lung is clear.  Heart size is stable and within normal limits.  No mass or lymphadenopathy identified.  IMPRESSION: Mild right lower lobe infiltrate, suspicious for pneumonia. Post- treatment  radiographic followup recommended to confirm resolution.  Original Report Authenticated By: Danae Orleans, M.D.   Ct Angio Chest W/cm &/or Wo Cm  09/23/2011  *RADIOLOGY REPORT*  Clinical Data: Right-sided pleuritic chest pain and cough. Elevated D-dimer.  CT ANGIOGRAPHY CHEST  Technique:  Multidetector CT imaging of the chest using the standard protocol during bolus administration of intravenous contrast. Multiplanar reconstructed images including MIPs were obtained and reviewed to evaluate the vascular anatomy.  Contrast: OMNIPAQUE IOHEXOL 350 MG/ML SOLN  Comparison: None.  Findings: Bilateral small pulmonary emboli are seen within the bilateral upper lobe and lower lobe segmental pulmonary arteries. No emboli seen in the main or central right or left pulmonary arteries. No evidence of thoracic aortic aneurysm or dissection.  Dependent atelectasis is seen in both lungs with tiny bilateral pleural effusions.  No evidence of pulmonary consolidation or mass.  Central tracheobronchial airways are patent.  No evidence of hilar or mediastinal mass.  No pathologically enlarged nodes identified within the thorax.  IMPRESSION:  1.  Acute bilateral segmental pulmonary emboli. 2.  Bilateral dependent atelectasis and tiny pleural effusions.  Critical Value/emergent results were called by telephone at the time of interpretation on 09/23/2011 at 1415 hours to Dr. Fredderick Phenix in the emergency department, who verbally acknowledged these results.  Original Report Authenticated By: Danae Orleans, M.D.    EKG: Independently reviewed. Sinus tachycardia no ST elevations or depressions  Assessment/Plan Principal Problem:  *Pulmonary embolism Active Problems:  HYPERLIPIDEMIA  HYPERTENSION, BENIGN ESSENTIAL   1. Pulmonary Embolism - Echocardiogram - Lovenox with bridging to coumadin pharmacy to dose - Telemetry monitoring - Monitor vitals - hypercoagulable panel - Patient mentions she had a colonoscopy which was negative less than three years ago.  Will consider CT of abdomen and pelvis tomorrow - Monitor CBC - Patient none toxic appearing and afebrile will not start antibiotics.  Shortness of breath likely related to PE and not infectious etiology  2. HTN -  Stable will plan on continuing home regimen blood pressure has ranged from 122/84-148/84 while in the ED.  - Make adjustments pending blood pressure readings.  3. HPL - Stable will plan on continuing crestor.  4. Depression - Patient has no recorded medication in MAR.  Currently stable will monitor.  5. Nausea - Etiology uncertain.  Will recheck urinalysis - Consider further work up with CT abdomen and pelvis given recent PE with unknown etiology and nausea. - Supportive therapy, IVF's and antiemetic  Code Status: Full Family Communication: Spoke with patient and family at bedside about current clinical condition. Disposition Plan: Will maintain patient on lovenox until I can bridge to coumadin.  Suspect  patient should be in the hospital for 3-5 days.  [name] [facility] Pager [phone]  If 7PM-7AM, please contact night-coverage [email or web address] Password [email or web address] 09/23/2011, 4:55 PM

## 2011-09-23 NOTE — Progress Notes (Signed)
ANTICOAGULATION CONSULT NOTE - Initial Consult  Pharmacy Consult for Warfarin, Lovenox Indication: pulmonary embolus  Allergies  Allergen Reactions  . Sulfamethoxazole W-Trimethoprim     REACTION: red rash    Patient Measurements: Height: 5\' 4"  (162.6 cm) Weight: 180 lb 14.4 oz (82.056 kg) IBW/kg (Calculated) : 54.7   Vital Signs: Temp: 99 F (37.2 C) (07/14 1838) Temp src: Oral (07/14 1838) BP: 133/80 mmHg (07/14 1838) Pulse Rate: 88  (07/14 1838)  Labs:  Basename 09/23/11 1241 09/23/11 1240  HGB 13.9 --  HCT 39.3 --  PLT 289 --  APTT -- 37  LABPROT -- 13.8  INR -- 1.04  HEPARINUNFRC -- --  CREATININE 0.75 --  CKTOTAL -- --  CKMB -- --  TROPONINI -- --    Estimated Creatinine Clearance: 75.6 ml/min (by C-G formula based on Cr of 0.75).   Medical History: Past Medical History  Diagnosis Date  . Hypertension   . Hypercholesteremia   . Heart murmur     Medications:  Scheduled:    . sodium chloride   Intravenous Once  . enoxaparin  80 mg Subcutaneous Once  . irbesartan  75 mg Oral Daily  . metoprolol tartrate  25 mg Oral BID  .  morphine injection  4 mg Intravenous Once  .  morphine injection  4 mg Intravenous Once  . moxifloxacin  400 mg Intravenous Once  . ondansetron  4 mg Intravenous Once  . oxyCODONE      . rosuvastatin  10 mg Oral Daily  . sodium chloride  3 mL Intravenous Q12H  . sodium chloride  3 mL Intravenous Q12H  . white petrolatum        Assessment:  60 YOF admit with aute bilateral PE (7/14)  Lovenox 80mg  x1 dose given 7/14  CBC and renal function are wnl  Baseline INR 1.04  Goal of Therapy:  INR 2-3 Monitor platelets by anticoagulation protocol: Yes   Plan:   Lovenox 80mg  SQ q12h  Warfarin 7.5mg  PO x1 dose tonight  Daily INR, CBC  Patient will need a minimum of 5 days coumadin/lovenox overlap and until INR > 2 for 24 hours.    Lynann Beaver PharmD, BCPS Pager [phone] 09/23/2011 7:25 PM

## 2011-09-23 NOTE — ED Notes (Signed)
Pt put back on cardiac monitor with SPO2 and blood pressure cycling q30 minutes. Phlebotomist at bedside.  Pt's HR 100, O2 Sats 97% on 2L [place].  Pt c/o pain in RUQ under rib cage.  Pt tearful due to phlebotomy sticks.

## 2011-09-23 NOTE — ED Provider Notes (Addendum)
History     CSN: [id number]  Arrival date & time 09/23/11  1203   First MD Initiated Contact with Patient 09/23/11 1221      Chief Complaint  Patient presents with  . Flank Pain    right lateral side    (Consider location/radiation/quality/duration/timing/severity/associated sxs/prior treatment) HPI Comments: Patient presents with a three-day history of worsening pain to her right lower chest area. She describes it as a sharp pain that is worse with cough, breathing, movement. She's had a little bit of shortness of breath. She's had some cough over last to 3 days as well with some mild mucus production. Denies a fevers or chills. She's had some nausea and vomiting only associated with the mucus production however. Denies abdominal pain. Denies any leg pain or swelling. Denies any recent trips or surgeries. Denies a history of blood clots in the past.   Patient is a 61 y.o. female presenting with flank pain. The history is provided by the patient.  Flank Pain Associated symptoms include chest pain and shortness of breath. Pertinent negatives include no abdominal pain and no headaches.    Past Medical History  Diagnosis Date  . Hypertension   . Hypercholesteremia   . Heart murmur     History reviewed. No pertinent past surgical history.  History reviewed. No pertinent family history.  History  Substance Use Topics  . Smoking status: Former Games developer  . Smokeless tobacco: Not on file  . Alcohol Use: 0.6 oz/week    1 Glasses of wine per week     occasion    OB History    Grav Para Term Preterm Abortions TAB SAB Ect Mult Living                  Review of Systems  Constitutional: Negative for fever, chills, diaphoresis and fatigue.  HENT: Negative for congestion, rhinorrhea and sneezing.   Eyes: Negative.   Respiratory: Positive for cough and shortness of breath. Negative for chest tightness and wheezing.   Cardiovascular: Positive for chest pain. Negative for leg  swelling.  Gastrointestinal: Positive for nausea. Negative for vomiting (only with mucus from cough), abdominal pain, diarrhea and blood in stool.  Genitourinary: Negative for frequency, hematuria, flank pain and difficulty urinating.  Musculoskeletal: Negative for back pain and arthralgias.  Skin: Negative for rash.  Neurological: Negative for dizziness, speech difficulty, weakness, numbness and headaches.    Allergies  Sulfamethoxazole w-trimethoprim  Home Medications   Current Outpatient Rx  Name Route Sig Dispense Refill  . ASPIRIN 81 MG PO TABS Oral Take 1 tablet (81 mg total) by mouth daily. 30 tablet 0  . METOPROLOL TARTRATE PO Oral Take 1 tablet by mouth 2 (two) times daily.     . CRESTOR PO Oral Take 1 tablet by mouth at bedtime.     . TELMISARTAN 80 MG PO TABS Oral Take 80 mg by mouth daily.      BP 126/83  Pulse 105  Temp 98.5 F (36.9 C)  Resp 17  SpO2 99%  Physical Exam  Constitutional: She is oriented to person, place, and time. She appears well-developed and well-nourished.  HENT:  Head: Normocephalic and atraumatic.  Eyes: Pupils are equal, round, and reactive to light.  Neck: Normal range of motion. Neck supple.  Cardiovascular: Normal rate, regular rhythm and normal heart sounds.   Pulmonary/Chest: Effort normal and breath sounds normal. No respiratory distress. She has no wheezes. She has no rales. She exhibits tenderness.  Mild TTP right lower lateral ribs.  No crepitus or deformity  Abdominal: Soft. Bowel sounds are normal. There is no tenderness. There is no rebound and no guarding.       No CVA tenderness  Musculoskeletal: Normal range of motion. She exhibits no edema and no tenderness.  Lymphadenopathy:    She has no cervical adenopathy.  Neurological: She is alert and oriented to person, place, and time.  Skin: Skin is warm and dry. No rash noted.  Psychiatric: She has a normal mood and affect.    ED Course  Procedures (including critical  care time)  Results for orders placed during the hospital encounter of 09/23/11  CBC WITH DIFFERENTIAL      Component Value Range   WBC 8.9  4.0 - 10.5 K/uL   RBC 4.75  3.87 - 5.11 MIL/uL   Hemoglobin 13.9  12.0 - 15.0 g/dL   HCT 16.1  09.6 - 04.5 %   MCV 82.7  78.0 - 100.0 fL   MCH 29.3  26.0 - 34.0 pg   MCHC 35.4  30.0 - 36.0 g/dL   RDW 40.9  81.1 - 91.4 %   Platelets 289  150 - 400 K/uL   Neutrophils Relative 81 (*) 43 - 77 %   Neutro Abs 7.2  1.7 - 7.7 K/uL   Lymphocytes Relative 11 (*) 12 - 46 %   Lymphs Abs 0.9  0.7 - 4.0 K/uL   Monocytes Relative 8  3 - 12 %   Monocytes Absolute 0.7  0.1 - 1.0 K/uL   Eosinophils Relative 1  0 - 5 %   Eosinophils Absolute 0.1  0.0 - 0.7 K/uL   Basophils Relative 0  0 - 1 %   Basophils Absolute 0.0  0.0 - 0.1 K/uL  COMPREHENSIVE METABOLIC PANEL      Component Value Range   Sodium 135  135 - 145 mEq/L   Potassium 3.2 (*) 3.5 - 5.1 mEq/L   Chloride 98  96 - 112 mEq/L   CO2 24  19 - 32 mEq/L   Glucose, Bld 170 (*) 70 - 99 mg/dL   BUN 8  6 - 23 mg/dL   Creatinine, Ser 7.82  0.50 - 1.10 mg/dL   Calcium 9.7  8.4 - 95.6 mg/dL   Total Protein 8.2  6.0 - 8.3 g/dL   Albumin 3.9  3.5 - 5.2 g/dL   AST 12  0 - 37 U/L   ALT 12  0 - 35 U/L   Alkaline Phosphatase 99  39 - 117 U/L   Total Bilirubin 1.3 (*) 0.3 - 1.2 mg/dL   GFR calc non Af Amer 89 (*) >90 mL/min   GFR calc Af Amer >90  >90 mL/min  D-DIMER, QUANTITATIVE      Component Value Range   D-Dimer, Quant 1.15 (*) 0.00 - 0.48 ug/mL-FEU   Dg Chest 2 View  09/23/2011  *RADIOLOGY REPORT*  Clinical Data: Cough.  Right-sided chest pain.  CHEST - 2 VIEW  Comparison: 09/06/2011  Findings: Mild right lower lobe infiltrate is new since previous study, and suspicious for pneumonia.  No evidence of pleural effusion.  Left lung is clear.  Heart size is stable and within normal limits.  No mass or lymphadenopathy identified.  IMPRESSION: Mild right lower lobe infiltrate, suspicious for pneumonia. Post-  treatment  radiographic followup recommended to confirm resolution.  Original Report Authenticated By: Danae Orleans, M.D.   Dg Chest 2 View  09/06/2011  *  RADIOLOGY REPORT*  Clinical Data: Chest pain with shortness of breath and left arm pain.  Hypertension.  CHEST - 2 VIEW  Comparison: None  Findings: Normal heart size with clear lung fields.  No effusion or pneumothorax.  No acute bony abnormality.  Moderate degenerative change thoracic spine.  Prior rotator cuff repair right shoulder.  IMPRESSION: No active cardiopulmonary disease.  Original Report Authenticated By: Elsie Stain, M.D.   Ct Angio Chest W/cm &/or Wo Cm  09/23/2011  *RADIOLOGY REPORT*  Clinical Data: Right-sided pleuritic chest pain and cough. Elevated D-dimer.  CT ANGIOGRAPHY CHEST  Technique:  Multidetector CT imaging of the chest using the standard protocol during bolus administration of intravenous contrast. Multiplanar reconstructed images including MIPs were obtained and reviewed to evaluate the vascular anatomy.  Contrast: OMNIPAQUE IOHEXOL 350 MG/ML SOLN  Comparison: None.  Findings: Bilateral small pulmonary emboli are seen within the bilateral upper lobe and lower lobe segmental pulmonary arteries. No emboli seen in the main or central right or left pulmonary arteries. No evidence of thoracic aortic aneurysm or dissection.  Dependent atelectasis is seen in both lungs with tiny bilateral pleural effusions.  No evidence of pulmonary consolidation or mass. Central tracheobronchial airways are patent.  No evidence of hilar or mediastinal mass.  No pathologically enlarged nodes identified within the thorax.  IMPRESSION:  1.  Acute bilateral segmental pulmonary emboli. 2.  Bilateral dependent atelectasis and tiny pleural effusions.  Critical Value/emergent results were called by telephone at the time of interpretation on 09/23/2011 at 1415 hours to Dr. Fredderick Phenix in the emergency department, who verbally acknowledged these results.   Original Report Authenticated By: Danae Orleans, M.D.   Date: 09/23/2011  Rate: 117  Rhythm: sinus tachycardia  QRS Axis: left  Intervals: normal  ST/T Wave abnormalities: nonspecific ST/T changes  Conduction Disutrbances:none  Narrative Interpretation:   Old EKG Reviewed: changes noted    1. Pulmonary emboli       MDM  Patient with evidence of bilateral pulmonary emboli on CT scan. Her oxygen saturation is 90% on room air however on a nasal cannula 2 L it is 96-97%. Her blood pressures range stable. She is in no apparent distress. We'll go ahead and start Lovenox and consult hospitalist for admission. There was a question of a right lower lobe infiltrate, so the patient was started on antibiotic therapy        Rolan Bucco, MD 09/23/11 1430  Rolan Bucco, MD 09/23/11 1526

## 2011-09-23 NOTE — ED Notes (Signed)
Patient is alert and oriented x3.  She is complaining of right side pain that has increased in pain over the last 36 hours. Current pain level is 9 of 10.  She denies any nausea or vomiting today.  She denies any traumatic injury or any past History of this issue.  Her pain is localize to upper right torso.

## 2011-09-24 DIAGNOSIS — F329 Major depressive disorder, single episode, unspecified: Secondary | ICD-10-CM

## 2011-09-24 DIAGNOSIS — R110 Nausea: Secondary | ICD-10-CM

## 2011-09-24 DIAGNOSIS — I10 Essential (primary) hypertension: Secondary | ICD-10-CM

## 2011-09-24 DIAGNOSIS — E785 Hyperlipidemia, unspecified: Secondary | ICD-10-CM

## 2011-09-24 DIAGNOSIS — I2699 Other pulmonary embolism without acute cor pulmonale: Principal | ICD-10-CM

## 2011-09-24 LAB — LUPUS ANTICOAGULANT PANEL
PTTLA 4:1 Mix: 50.8 secs — ABNORMAL HIGH (ref 28.0–43.0)
PTTLA Confirmation: 18 secs — ABNORMAL HIGH (ref <8.0)
dRVVT Incubated 1:1 Mix: 39.7 secs (ref <45.1)

## 2011-09-24 LAB — CBC
MCH: 28.6 pg (ref 26.0–34.0)
Platelets: 243 10*3/uL (ref 150–400)
RBC: 3.88 MIL/uL (ref 3.87–5.11)
RDW: 12.6 % (ref 11.5–15.5)
WBC: 7.1 10*3/uL (ref 4.0–10.5)

## 2011-09-24 LAB — BASIC METABOLIC PANEL
Calcium: 8.6 mg/dL (ref 8.4–10.5)
Creatinine, Ser: 0.86 mg/dL (ref 0.50–1.10)
GFR calc non Af Amer: 71 mL/min — ABNORMAL LOW (ref >90)
Sodium: 134 mEq/L — ABNORMAL LOW (ref 135–145)

## 2011-09-24 LAB — PROTIME-INR
INR: 1.17 (ref 0.00–1.49)
Prothrombin Time: 15.1 seconds (ref 11.6–15.2)

## 2011-09-24 LAB — BETA-2-GLYCOPROTEIN I ABS, IGG/M/A
Beta-2 Glyco I IgG: 0 G Units (ref <20)
Beta-2-Glycoprotein I IgM: 5 M Units (ref <20)

## 2011-09-24 LAB — FACTOR 5 LEIDEN

## 2011-09-24 LAB — HOMOCYSTEINE: Homocysteine: 6.9 umol/L (ref 4.0–15.4)

## 2011-09-24 LAB — PROTEIN C, TOTAL: Protein C, Total: 95 % (ref 72–160)

## 2011-09-24 LAB — ANTITHROMBIN III: AntiThromb III Func: 125 % — ABNORMAL HIGH (ref 76–126)

## 2011-09-24 LAB — CARDIOLIPIN ANTIBODIES, IGG, IGM, IGA: Anticardiolipin IgA: 6 APL U/mL — ABNORMAL LOW (ref <22)

## 2011-09-24 LAB — PROTEIN S ACTIVITY: Protein S Activity: 96 % (ref 69–129)

## 2011-09-24 MED ORDER — WARFARIN SODIUM 7.5 MG PO TABS
7.5000 mg | ORAL_TABLET | Freq: Once | ORAL | Status: AC
  Administered 2011-09-24: 7.5 mg via ORAL
  Filled 2011-09-24: qty 1

## 2011-09-24 NOTE — Progress Notes (Signed)
  Echocardiogram 2D Echocardiogram has been performed.  Kody Brandl 09/24/2011, 10:19 AM

## 2011-09-24 NOTE — Progress Notes (Signed)
   CARE MANAGEMENT NOTE 09/24/2011  Patient:  Victoria Campos, Victoria Campos   Account Number:  [id number]  Date Initiated:  09/24/2011  Documentation initiated by:  Jiles Crocker  Subjective/Objective Assessment:   ADMITTED WITH PE     Action/Plan:   PCP: Lehman Prom, NP  LIVES ALONE   Anticipated DC Date:  09/28/2011   Anticipated DC Plan:  HOME/SELF CARE  In-house referral  Financial Counselor      DC Planning Services  CM consult               Status of service:  In process, will continue to follow Medicare Important Message given?  NA - LOS <3 / Initial given by admissions (If response is "NO", the following Medicare IM given date fields will be blank):    Discharge Disposition:  HOME  Per UR Regulation:  Reviewed for med. necessity/level of care/duration of stay  Comments:  09/24/2011- B Jaala Bohle RN, BSN, MHA

## 2011-09-24 NOTE — Progress Notes (Signed)
ANTICOAGULATION CONSULT NOTE - Follow Up Consult  Pharmacy Consult for Warfarin, Lovenox Indication: pulmonary embolus  Allergies  Allergen Reactions  . Sulfamethoxazole W-Trimethoprim     REACTION: red rash    Patient Measurements: Height: 5\' 4"  (162.6 cm) Weight: 180 lb 14.4 oz (82.056 kg) IBW/kg (Calculated) : 54.7   Vital Signs: Temp: 99.3 F (37.4 C) (07/15 0707) Temp src: Oral (07/15 0707) BP: 130/79 mmHg (07/15 0707) Pulse Rate: 90  (07/15 0707)  Labs:  Basename 09/24/11 1231 09/24/11 0442 09/23/11 1241 09/23/11 1240  HGB -- 11.1* 13.9 --  HCT -- 32.8* 39.3 --  PLT -- 243 289 --  APTT -- -- -- 37  LABPROT 15.1 -- -- 13.8  INR 1.17 -- -- 1.04  HEPARINUNFRC -- -- -- --  CREATININE -- 0.86 0.75 --  CKTOTAL -- -- -- --  CKMB -- -- -- --  TROPONINI -- -- -- --    Estimated Creatinine Clearance: 70.3 ml/min (by C-G formula based on Cr of 0.86).  Assessment:  54 YOF with new bilateral PE on CT (7/14).  Lovenox and Coumadin started.  Coumadin score = 6.    Today is Day#2 of bridging.  Patient will need minimum of 5 days bridging and INR > 2 for ~48hrs.   INR 1.17, as expected with Coumadin initiation  H/H 11.1/32.8, stable renal function, CrCl 70 ml/min  NO bleeding/complications noted  Goal of Therapy:  INR 2-3 Anti-Xa level 0.6-1.2 units/ml 4hrs after LMWH dose given Monitor platelets by anticoagulation protocol: Yes   Plan:   Repeat Coumadin 7.5 mg po x 1 tonight  Continue Lovenox 80 mg sq q12h (will adjust time so it's more convenient for patient)  Will provide Coumadin education today  Daily PT/INR, pharmacy will f/u  Geoffry Paradise Thi 09/24/2011,1:24 PM

## 2011-09-24 NOTE — Progress Notes (Signed)
[facility] PROGRESS NOTE  Victoria Campos ONG:[id number] DOB: 1950-07-04 DOA: 09/23/2011 PCP: Lehman Prom, NP  Assessment/Plan: Principal Problem:  *Pulmonary embolism Active Problems:  HYPERLIPIDEMIA  ANXIETY  DEPRESSION  MIGRAINE HEADACHE  HYPERTENSION, BENIGN ESSENTIAL  Nausea 1. Pulmonary Embolism - Echocardiogram pending - Lovenox with bridging to coumadin pharmacy to dose  - Telemetry monitoring  - Monitor vitals  - hypercoagulable panel pending - Considering CT of abdomen and pelvis - Monitor CBC  - Patient none toxic appearing and afebrile will not start antibiotics. Shortness of breath likely related to PE and not infectious etiology. WBC within normal limits today.  2. HTN  - Stable will plan on continuing home regimen blood pressure has ranged from 130/79-143/77.  - Make adjustments pending blood pressure readings.   3. HPL  - Stable will plan on continuing crestor.   4. Depression  - Patient has no recorded medication in MAR. Currently stable will monitor.   5. Nausea  - Etiology uncertain. Will recheck urinalysis  - Consider further work up with CT abdomen and pelvis given recent PE with unknown etiology and nausea.  - Supportive therapy, IVF's and antiemetic   Code Status: Full  Family Communication: Spoke with patient and family at bedside about current clinical condition.  Disposition Plan: Will maintain patient on lovenox until I can bridge to coumadin. Suspect patient should be in the hospital for 3-5 days.     Victoria Campos  [facility] Pager [phone]. If 8PM-8AM, please contact night-coverage at [email or web address], password [facility] 09/24/2011, 6:16 PM  LOS: 1 day   Brief narrative: See HPI  Consultants:  none  Procedures:  none  Antibiotics:  none  HPI/Subjective: Patient mentions that she still has right sided chest discomfort.  Tolerable with current pain regimen.  No acute issues overnight.  Denies fevers or chills and  mentions that nausea is ameliorating.  Objective: Filed Vitals:   09/23/11 1853 09/23/11 2203 09/24/11 0707 09/24/11 1356  BP:  129/82 130/79 143/77  Pulse:  93 90 88  Temp:  98.8 F (37.1 C) 99.3 F (37.4 C) 100.2 F (37.9 C)  TempSrc:  Oral Oral Oral  Resp:  21 21 20   Height: 5\' 4"  (1.626 m)     Weight: 82.056 kg (180 lb 14.4 oz)     SpO2:  95% 96% 93%    Intake/Output Summary (Last 24 hours) at 09/24/11 1816 Last data filed at 09/24/11 1521  Gross per 24 hour  Intake 2141.67 ml  Output   1750 ml  Net 391.67 ml    Exam:   General:  Pt in NAD, A and O x 3  Cardiovascular: RRR, No murmurs rubs or gallops  Respiratory: CTA BL, no wheezes  Abdomen: soft, nt, nd  Data Reviewed: Basic Metabolic Panel:  Lab 09/24/11 2536 09/23/11 1241  NA 134* 135  K 3.5 3.2*  CL 102 98  CO2 23 24  GLUCOSE 116* 170*  BUN 6 8  CREATININE 0.86 0.75  CALCIUM 8.6 9.7  MG -- --  PHOS -- --   Liver Function Tests:  Lab 09/23/11 1241  AST 12  ALT 12  ALKPHOS 99  BILITOT 1.3*  PROT 8.2  ALBUMIN 3.9   No results found for this basename: LIPASE:5,AMYLASE:5 in the last 168 hours No results found for this basename: AMMONIA:5 in the last 168 hours CBC:  Lab 09/24/11 0442 09/23/11 1241  WBC 7.1 8.9  NEUTROABS -- 7.2  HGB 11.1* 13.9  HCT 32.8* 39.3  MCV 84.5 82.7  PLT 243 289   Cardiac Enzymes: No results found for this basename: CKTOTAL:5,CKMB:5,CKMBINDEX:5,TROPONINI:5 in the last 168 hours BNP (last 3 results) No results found for this basename: PROBNP:3 in the last 8760 hours CBG: No results found for this basename: GLUCAP:5 in the last 168 hours  Recent Results (from the past 240 hour(s))  CULTURE, BLOOD (ROUTINE X 2)     Status: Normal (Preliminary result)   Collection Time   09/23/11  2:00 PM      Component Value Range Status Comment   Specimen Description BLOOD RIGHT ARM   Final    Special Requests BOTTLES DRAWN AEROBIC ONLY 2CC   Final    Culture  Setup Time  09/23/2011 19:21   Final    Culture     Final    Value:        BLOOD CULTURE RECEIVED NO GROWTH TO DATE CULTURE WILL BE HELD FOR 5 DAYS BEFORE ISSUING A FINAL NEGATIVE REPORT   Report Status PENDING   Incomplete   CULTURE, BLOOD (ROUTINE X 2)     Status: Normal (Preliminary result)   Collection Time   09/23/11  2:08 PM      Component Value Range Status Comment   Specimen Description BLOOD LEFT HAND   Final    Special Requests BOTTLES DRAWN AEROBIC AND ANAEROBIC 3CC EACH   Final    Culture  Setup Time 09/23/2011 19:21   Final    Culture     Final    Value:        BLOOD CULTURE RECEIVED NO GROWTH TO DATE CULTURE WILL BE HELD FOR 5 DAYS BEFORE ISSUING A FINAL NEGATIVE REPORT   Report Status PENDING   Incomplete      Studies: Dg Chest 2 View  09/23/2011  *RADIOLOGY REPORT*  Clinical Data: Cough.  Right-sided chest pain.  CHEST - 2 VIEW  Comparison: 09/06/2011  Findings: Mild right lower lobe infiltrate is new since previous study, and suspicious for pneumonia.  No evidence of pleural effusion.  Left lung is clear.  Heart size is stable and within normal limits.  No mass or lymphadenopathy identified.  IMPRESSION: Mild right lower lobe infiltrate, suspicious for pneumonia. Post- treatment  radiographic followup recommended to confirm resolution.  Original Report Authenticated By: Danae Orleans, M.D.   Dg Chest 2 View  09/06/2011  *RADIOLOGY REPORT*  Clinical Data: Chest pain with shortness of breath and left arm pain.  Hypertension.  CHEST - 2 VIEW  Comparison: None  Findings: Normal heart size with clear lung fields.  No effusion or pneumothorax.  No acute bony abnormality.  Moderate degenerative change thoracic spine.  Prior rotator cuff repair right shoulder.  IMPRESSION: No active cardiopulmonary disease.  Original Report Authenticated By: Elsie Stain, M.D.   Ct Angio Chest W/cm &/or Wo Cm  09/23/2011  *RADIOLOGY REPORT*  Clinical Data: Right-sided pleuritic chest pain and cough. Elevated  D-dimer.  CT ANGIOGRAPHY CHEST  Technique:  Multidetector CT imaging of the chest using the standard protocol during bolus administration of intravenous contrast. Multiplanar reconstructed images including MIPs were obtained and reviewed to evaluate the vascular anatomy.  Contrast: OMNIPAQUE IOHEXOL 350 MG/ML SOLN  Comparison: None.  Findings: Bilateral small pulmonary emboli are seen within the bilateral upper lobe and lower lobe segmental pulmonary arteries. No emboli seen in the main or central right or left pulmonary arteries. No evidence of thoracic aortic aneurysm or dissection.  Dependent atelectasis is seen in  both lungs with tiny bilateral pleural effusions.  No evidence of pulmonary consolidation or mass. Central tracheobronchial airways are patent.  No evidence of hilar or mediastinal mass.  No pathologically enlarged nodes identified within the thorax.  IMPRESSION:  1.  Acute bilateral segmental pulmonary emboli. 2.  Bilateral dependent atelectasis and tiny pleural effusions.  Critical Value/emergent results were called by telephone at the time of interpretation on 09/23/2011 at 1415 hours to Dr. Fredderick Phenix in the emergency department, who verbally acknowledged these results.  Original Report Authenticated By: Danae Orleans, M.D.   Ct Heart Morp W/cta Cor W/score W/ca W/cm &/or Wo/cm  09/07/2011  *RADIOLOGY REPORT*  INDICATION:  Chest pain.  CT ANGIOGRAPHY OF THE HEART, CORONARY ARTERY, STRUCTURE, AND MORPHOLOGY  CONTRAST: 80mL OMNIPAQUE IOHEXOL 350 MG/ML SOLN  COMPARISON:  None  TECHNIQUE:  CT angiography of the coronary vessels was performed on a 256 channel system using prospective ECG gating.  A scout and noncontrast exam (for calcium scoring) were performed.  Circulation time was measured using a test bolus.  Coronary CTA was performed with sub mm slice collimation during portions of the cardiac cycle after prior injection of iodinated contrast.  Imaging post processing was performed on an  independent workstation creating multiplanar and 3-D images, and quantitative analysis of the heart and coronary arteries.  Note that this exam targets the heart and the chest was not imaged in its entirety.  PREMEDICATION: Lopressor 125 mg, P.O. Lopressor 5 mg, IV Nitroglycerin 0.4 mcg, sublingual.  FINDINGS: Technical quality:  Good.  Heart rate:  53  CORONARY ARTERIES: Left main coronary artery:  Long vessel of average caliber.  No intrinsic disease or stenosis.  Gives rise to the left anterior descending artery and left circumflex artery. Left anterior descending:  Gives rise to septal perforator branches and at least three small average size diagonal branches.  No intrinsic disease or stenosis. Left circumflex:  Gives rise to a large proximal first obtuse marginal branch.  No intrinsic disease or stenosis. Right coronary artery:  Minimal calcified plaque in the proximal to mid RCA.  No significant stenosis. Posterior descending artery:  Arises from the right coronary artery.  No intrinsic disease. Dominance:  Right.  CORONARY CALCIUM: Total Agatston Score:  2.48 MESA database percentile:  57  CARDIAC MEASUREMENTS: Interventricular septum (6 - 12 mm):  10 mm. LV posterior wall (6 - 12 mm):  11 mm. LV diameter in diastole (35 - 52 mm):  32 mm.  AORTA AND PULMONARY MEASUREMENTS: Aortic root (21 - 40 mm):             21 mm  at the annulus             27 mm  at the sinuses of Valsalva             24 mm  at the sinotubular junction Ascending aorta ( <  40 mm):  36 mm Descending aorta ( <  40 mm):  24 mm Main pulmonary artery:  ( <  30 mm):  25 mm  EXTRACARDIAC FINDINGS: Dependent atelectasis in the lungs bilaterally.  Densities noted in the lingula and left base could represent areas of scarring.  No pleural effusions.  No adenopathy in the visualized inferior mediastinum or hila.  Visualized chest wall soft tissues and upper abdomen unremarkable.  Degenerative changes in the thoracic spine.  IMPRESSION:  1.  Minimal  coronary artery disease.  The patient's total coronary artery calcium score is 2.48, which  is 57 percentile for patient's matched age and gender. 2.  Minimal calcified plaque in the proximal to mid right coronary artery.  No significant stenosis. 3.  Right coronary artery dominance.  Report was called to Tiffany at 3:20 p.m. on 09/07/2011.  Original Report Authenticated By: Cyndie Chime, M.D.    Scheduled Meds:   . enoxaparin (LOVENOX) injection  1 mg/kg Subcutaneous Q12H  . irbesartan  75 mg Oral Daily  . metoprolol tartrate  25 mg Oral BID  . oxyCODONE      . rosuvastatin  10 mg Oral Daily  . sodium chloride  3 mL Intravenous Q12H  . sodium chloride  3 mL Intravenous Q12H  . warfarin  7.5 mg Oral Once  . warfarin  7.5 mg Oral ONCE-1800  . Warfarin - Pharmacist Dosing Inpatient   Does not apply q1800   Continuous Infusions:   . sodium chloride 100 mL/hr at 09/24/11 1521    Principal Problem:  *Pulmonary embolism Active Problems:  HYPERLIPIDEMIA  ANXIETY  DEPRESSION  MIGRAINE HEADACHE  HYPERTENSION, BENIGN ESSENTIAL  Nausea

## 2011-09-25 DIAGNOSIS — R509 Fever, unspecified: Secondary | ICD-10-CM | POA: Diagnosis not present

## 2011-09-25 LAB — PROTIME-INR: Prothrombin Time: 15.4 seconds — ABNORMAL HIGH (ref 11.6–15.2)

## 2011-09-25 LAB — CBC
MCH: 28.5 pg (ref 26.0–34.0)
MCHC: 34.5 g/dL (ref 30.0–36.0)
MCV: 82.5 fL (ref 78.0–100.0)
Platelets: 208 10*3/uL (ref 150–400)
RBC: 3.55 MIL/uL — ABNORMAL LOW (ref 3.87–5.11)
RDW: 12.1 % (ref 11.5–15.5)

## 2011-09-25 MED ORDER — WARFARIN SODIUM 10 MG PO TABS
10.0000 mg | ORAL_TABLET | Freq: Once | ORAL | Status: AC
  Administered 2011-09-25: 10 mg via ORAL
  Filled 2011-09-25: qty 1

## 2011-09-25 MED ORDER — LEVOFLOXACIN IN D5W 750 MG/150ML IV SOLN
750.0000 mg | INTRAVENOUS | Status: DC
  Administered 2011-09-25 – 2011-09-26 (×2): 750 mg via INTRAVENOUS
  Filled 2011-09-25 (×3): qty 150

## 2011-09-25 NOTE — Progress Notes (Signed)
ANTICOAGULATION CONSULT NOTE - Follow Up Consult  Pharmacy Consult for Warfarin, Lovenox Indication: pulmonary embolus  Allergies  Allergen Reactions  . Sulfamethoxazole W-Trimethoprim     REACTION: red rash    Patient Measurements: Height: 5\' 4"  (162.6 cm) Weight: 180 lb 14.4 oz (82.056 kg) IBW/kg (Calculated) : 54.7   Vital Signs: Temp: 100.6 F (38.1 C) (07/16 0838) Temp src: Oral (07/16 0838) BP: 194/98 mmHg (07/16 0838) Pulse Rate: 106  (07/16 0838)  Labs:  Basename 09/25/11 0414 09/24/11 1231 09/24/11 0442 09/23/11 1241 09/23/11 1240  HGB 10.1* -- 11.1* -- --  HCT 29.3* -- 32.8* 39.3 --  PLT 208 -- 243 289 --  APTT -- -- -- -- 37  LABPROT 15.4* 15.1 -- -- 13.8  INR 1.19 1.17 -- -- 1.04  HEPARINUNFRC -- -- -- -- --  CREATININE -- -- 0.86 0.75 --  CKTOTAL -- -- -- -- --  CKMB -- -- -- -- --  TROPONINI -- -- -- -- --    Estimated Creatinine Clearance: 70.3 ml/min (by C-G formula based on Cr of 0.86).  Assessment:  38 YOF with new bilateral PE on CT (7/14).  Lovenox and Coumadin started.  Coumadin score = 6.  Lupus anticoagulant positive on coagulable panel.  Today is Day#3 of bridging.  Patient will need minimum of 5 days bridging and INR > 2 for ~48hrs.   INR 1.17, as expected with Coumadin initiation  H/H 10.1/29.3, stable renal function, CrCl 70 ml/min  NO bleeding/complications noted  Coumadin education provided to patient 7/15.  Goal of Therapy:  INR 2-3 Anti-Xa level 0.6-1.2 units/ml 4hrs after LMWH dose given Monitor platelets by anticoagulation protocol: Yes   Plan:   Increase Coumadin to 10 mg po x 1 tonight  Continue Lovenox 80 mg sq q12h (will adjust time so it's more convenient for patient)  Daily PT/INR, pharmacy will f/u  MD: Patient does not have to be here for coumadin bridging until therapeutic if patient is otherwise ready for discharge.  Can go home on Lovenox and f/u at [facility].  Thanks.  Geoffry Paradise  Thi 09/25/2011,8:58 AM

## 2011-09-25 NOTE — Progress Notes (Signed)
Patient temp was 101.2. Patient has some increased respirations at rest. Oxygen sat 95% on 2L [place]. MD was notified. New orders placed. Will continue to monitor patient. Setzer, Don Broach

## 2011-09-25 NOTE — Progress Notes (Deleted)
ANTICOAGULATION CONSULT NOTE - Follow Up Consult  Pharmacy Consult for Warfarin, Lovenox Indication: pulmonary embolus  Allergies  Allergen Reactions  . Sulfamethoxazole W-Trimethoprim     REACTION: red rash    Patient Measurements: Height: 5\' 4"  (162.6 cm) Weight: 180 lb 14.4 oz (82.056 kg) IBW/kg (Calculated) : 54.7   Vital Signs: Temp: 100.6 F (38.1 C) (07/16 0838) Temp src: Oral (07/16 0838) BP: 194/98 mmHg (07/16 0838) Pulse Rate: 106  (07/16 0838)  Labs:  Basename 09/25/11 0414 09/24/11 1231 09/24/11 0442 09/23/11 1241 09/23/11 1240  HGB 10.1* -- 11.1* -- --  HCT 29.3* -- 32.8* 39.3 --  PLT 208 -- 243 289 --  APTT -- -- -- -- 37  LABPROT 15.4* 15.1 -- -- 13.8  INR 1.19 1.17 -- -- 1.04  HEPARINUNFRC -- -- -- -- --  CREATININE -- -- 0.86 0.75 --  CKTOTAL -- -- -- -- --  CKMB -- -- -- -- --  TROPONINI -- -- -- -- --    Estimated Creatinine Clearance: 70.3 ml/min (by C-G formula based on Cr of 0.86).  Assessment:  67 YOF with new bilateral PE on CT (7/14).  Lovenox and Coumadin started.  Coumadin score = 6.  Lupus anticoagulant positive on coagulable panel.  Today is Day#3 of bridging.  Patient will need minimum of 5 days bridging and INR > 2 for ~48hrs.   INR 1.17, as expected with Coumadin initiation  H/H 10.1/29.3, stable renal function, CrCl 70 ml/min  NO bleeding/complications noted  Coumadin education provided to patient 7/15.  Goal of Therapy:  INR 2-3 Anti-Xa level 0.6-1.2 units/ml 4hrs after LMWH dose given Monitor platelets by anticoagulation protocol: Yes   Plan:   Increase Coumadin to 10 mg po x 1 tonight  Continue Lovenox 80 mg sq q12h (will adjust time so it's more convenient for patient)  Daily PT/INR, pharmacy will f/u  Geoffry Paradise Thi 09/25/2011,8:53 AM

## 2011-09-25 NOTE — Progress Notes (Signed)
ANTIBIOTIC CONSULT NOTE - INITIAL  Pharmacy Consult for Levaquin  Indication: pneumonia  Allergies  Allergen Reactions  . Sulfamethoxazole W-Trimethoprim     REACTION: red rash    Patient Measurements: Height: 5\' 4"  (162.6 cm) Weight: 180 lb 14.4 oz (82.056 kg) IBW/kg (Calculated) : 54.7   Vital Signs: Temp: 101.2 F (38.4 C) (07/16 1331) Temp src: Oral (07/16 1331) BP: 168/92 mmHg (07/16 1321) Pulse Rate: 91  (07/16 1321) Intake/Output from previous day: 07/15 0701 - 07/16 0700 In: 2965 [P.O.:600; I.V.:2365] Out: 2950 [Urine:2950] Intake/Output from this shift: Total I/O In: 480 [P.O.:480] Out: 1650 [Urine:1650]  Labs:  [facility] 09/25/11 0414 09/24/11 0442 09/23/11 1241  WBC 5.2 7.1 8.9  HGB 10.1* 11.1* 13.9  PLT 208 243 289  LABCREA -- -- --  CREATININE -- 0.86 0.75   Estimated Creatinine Clearance: 70.3 ml/min (by C-G formula based on Cr of 0.86). No results found for this basename: VANCOTROUGH:2,VANCOPEAK:2,VANCORANDOM:2,GENTTROUGH:2,GENTPEAK:2,GENTRANDOM:2,TOBRATROUGH:2,TOBRAPEAK:2,TOBRARND:2,AMIKACINPEAK:2,AMIKACINTROU:2,AMIKACIN:2, in the last 72 hours   Assessment:  52 YOF admitted with bilateral PE.  7/14 CTA with dilateral dependent atelectasis and tiny pleural effusions now with Tmax of 102.7, MD consulted pharmacy for Levaquin dosing for r/o CAP/lung infections.  7/14 blood cx pending,   WBC wnl.  Possible drug/drug interaction with Warfarin noted  CrCl 70 ml/min  Plan:   Levaquin 750 mg IV q24h  F/u to transition to PO when able  Geoffry Paradise Thi 09/25/2011,2:01 PM

## 2011-09-25 NOTE — Progress Notes (Signed)
Talked to patient about follow up medical care, patient is active with [facility]; [facility] called- apt made with Dr Norberto Sorenson 10/01/2011 at 3:30 to be seen and have blood work done- arranged with Olegario Messier the triage nurse; Patient qualifies for the indigent funds for Lovenox. Abelino Derrick RN, BSN, [employer]

## 2011-09-25 NOTE — Progress Notes (Signed)
[facility] PROGRESS NOTE  Victoria Campos:[id number] DOB: 06/09/1950 DOA: 09/23/2011 PCP: Lehman Prom, NP  Assessment/Plan: Principal Problem:  *Pulmonary embolism Active Problems:  HYPERLIPIDEMIA  ANXIETY  DEPRESSION  MIGRAINE HEADACHE  HYPERTENSION, BENIGN ESSENTIAL  Nausea Fever  1. Pulmonary Embolism - Did detect lupus anticoagulant on hypercoagulation panel.  Raising suspicion for hypercoagulable state as cause of PE. - Continue lovenox with bridging to coumadin day # 2 - supplemental oxygen and supportive therapy - Echo results reviewed - f/u with care manager consult given no insurance or pcp  2. Nausea - improved today was able to tolerate po intake - Continue antiemetic PRN - Consider further work-up if no resolution  3. Fever - initial chest x ray showed atelectasis vs infiltrate - could be secondary to atelectasis will order incentive spirometry - acetaminophen for fevers - Will add levaquin today and cover for CAP   4. HTN  - Stable will plan on continuing home regimen. Continue to monitor BP's. - Make adjustments pending blood pressure readings.   5. HPL  - Stable will plan on continuing crestor.   6. Depression  - Patient has no recorded medication in MAR. Currently stable will monitor.    Code Status: Full Family Communication: No family at bedside Disposition Plan: Will bridge from lovenox to coumadin and once coumadin at [facility] would consider d/c  Victoria Campos  [facility] Pager [phone]. If 8PM-8AM, please contact night-coverage at [email or web address], password [facility] 09/25/2011, 1:44 PM  LOS: 2 days   Brief narrative: 61 y/o AAF nonsmoker recently admitted for PE.  Placed on lovenox and bridging to coumadin.  Echo results as below.  Consulted care manager because patient has no insurance or pcp.  Spiked a fever and thus was started on levaquin today, may be secondary to atelectasis vs pe but nonetheless wanted to cover for  CAP.  Consultants:  none  Procedures: Echocardiogram: Study Conclusions  Left ventricle: The cavity size was normal. Systolic function was vigorous. The estimated ejection fraction was in the range of 65% to 70%. Wall motion was normal; there were no regional wall motion abnormalities. Doppler parameters are consistent with abnormal left ventricular relaxation (grade 1 diastolic dysfunction). Doppler parameters are consistent with high ventricular filling pressure  Antibiotics:  Levaquin per pharmacy started 7/16  HPI/Subjective: Patient mentions that she has improved po intake today.  Still some discomfort at right chest.  No acute issues overnight.  Denies any HA's, leg pain, worsening SOB  Objective: Filed Vitals:   09/25/11 0838 09/25/11 1058 09/25/11 1321 09/25/11 1331  BP: 194/98 172/101 168/92   Pulse: 106 96 91   Temp: 100.6 F (38.1 C)  102.7 F (39.3 C) 101.2 F (38.4 C)  TempSrc: Oral   Oral  Resp:      Height:      Weight:      SpO2: 93%  98%     Intake/Output Summary (Last 24 hours) at 09/25/11 1344 Last data filed at 09/25/11 1244  Gross per 24 hour  Intake   3205 ml  Output   3550 ml  Net   -345 ml    Exam:   General:  PT in NAD, A and O x 3  Cardiovascular: RRR, no MRG  Respiratory: CTA with diminished breath sounds at bases, speaks in full sentences  Abdomen: soft, nt, nd, negative murphy's sign  Extremities: no calf pain.  Data Reviewed: Basic Metabolic Panel:  Lab 09/24/11 8657 09/23/11 1241  NA 134* 135  K  3.5 3.2*  CL 102 98  CO2 23 24  GLUCOSE 116* 170*  BUN 6 8  CREATININE 0.86 0.75  CALCIUM 8.6 9.7  MG -- --  PHOS -- --   Liver Function Tests:  Lab 09/23/11 1241  AST 12  ALT 12  ALKPHOS 99  BILITOT 1.3*  PROT 8.2  ALBUMIN 3.9   No results found for this basename: LIPASE:5,AMYLASE:5 in the last 168 hours No results found for this basename: AMMONIA:5 in the last 168 hours CBC:  Lab 09/25/11 0414 09/24/11  0442 09/23/11 1241  WBC 5.2 7.1 8.9  NEUTROABS -- -- 7.2  HGB 10.1* 11.1* 13.9  HCT 29.3* 32.8* 39.3  MCV 82.5 84.5 82.7  PLT 208 243 289   Cardiac Enzymes: No results found for this basename: CKTOTAL:5,CKMB:5,CKMBINDEX:5,TROPONINI:5 in the last 168 hours BNP (last 3 results) No results found for this basename: PROBNP:3 in the last 8760 hours CBG: No results found for this basename: GLUCAP:5 in the last 168 hours  Recent Results (from the past 240 hour(s))  CULTURE, BLOOD (ROUTINE X 2)     Status: Normal (Preliminary result)   Collection Time   09/23/11  2:00 PM      Component Value Range Status Comment   Specimen Description BLOOD RIGHT ARM   Final    Special Requests BOTTLES DRAWN AEROBIC ONLY 2CC   Final    Culture  Setup Time 09/23/2011 19:21   Final    Culture     Final    Value:        BLOOD CULTURE RECEIVED NO GROWTH TO DATE CULTURE WILL BE HELD FOR 5 DAYS BEFORE ISSUING A FINAL NEGATIVE REPORT   Report Status PENDING   Incomplete   CULTURE, BLOOD (ROUTINE X 2)     Status: Normal (Preliminary result)   Collection Time   09/23/11  2:08 PM      Component Value Range Status Comment   Specimen Description BLOOD LEFT HAND   Final    Special Requests BOTTLES DRAWN AEROBIC AND ANAEROBIC 3CC EACH   Final    Culture  Setup Time 09/23/2011 19:21   Final    Culture     Final    Value:        BLOOD CULTURE RECEIVED NO GROWTH TO DATE CULTURE WILL BE HELD FOR 5 DAYS BEFORE ISSUING A FINAL NEGATIVE REPORT   Report Status PENDING   Incomplete      Studies: Dg Chest 2 View  09/23/2011  *RADIOLOGY REPORT*  Clinical Data: Cough.  Right-sided chest pain.  CHEST - 2 VIEW  Comparison: 09/06/2011  Findings: Mild right lower lobe infiltrate is new since previous study, and suspicious for pneumonia.  No evidence of pleural effusion.  Left lung is clear.  Heart size is stable and within normal limits.  No mass or lymphadenopathy identified.  IMPRESSION: Mild right lower lobe infiltrate, suspicious  for pneumonia. Post- treatment  radiographic followup recommended to confirm resolution.  Original Report Authenticated By: Danae Orleans, M.D.   Dg Chest 2 View  09/06/2011  *RADIOLOGY REPORT*  Clinical Data: Chest pain with shortness of breath and left arm pain.  Hypertension.  CHEST - 2 VIEW  Comparison: None  Findings: Normal heart size with clear lung fields.  No effusion or pneumothorax.  No acute bony abnormality.  Moderate degenerative change thoracic spine.  Prior rotator cuff repair right shoulder.  IMPRESSION: No active cardiopulmonary disease.  Original Report Authenticated By: Elsie Stain, M.D.  Ct Angio Chest W/cm &/or Wo Cm  09/23/2011  *RADIOLOGY REPORT*  Clinical Data: Right-sided pleuritic chest pain and cough. Elevated D-dimer.  CT ANGIOGRAPHY CHEST  Technique:  Multidetector CT imaging of the chest using the standard protocol during bolus administration of intravenous contrast. Multiplanar reconstructed images including MIPs were obtained and reviewed to evaluate the vascular anatomy.  Contrast: OMNIPAQUE IOHEXOL 350 MG/ML SOLN  Comparison: None.  Findings: Bilateral small pulmonary emboli are seen within the bilateral upper lobe and lower lobe segmental pulmonary arteries. No emboli seen in the main or central right or left pulmonary arteries. No evidence of thoracic aortic aneurysm or dissection.  Dependent atelectasis is seen in both lungs with tiny bilateral pleural effusions.  No evidence of pulmonary consolidation or mass. Central tracheobronchial airways are patent.  No evidence of hilar or mediastinal mass.  No pathologically enlarged nodes identified within the thorax.  IMPRESSION:  1.  Acute bilateral segmental pulmonary emboli. 2.  Bilateral dependent atelectasis and tiny pleural effusions.  Critical Value/emergent results were called by telephone at the time of interpretation on 09/23/2011 at 1415 hours to Dr. Fredderick Phenix in the emergency department, who verbally  acknowledged these results.  Original Report Authenticated By: Danae Orleans, M.D.   Ct Heart Morp W/cta Cor W/score W/ca W/cm &/or Wo/cm  09/07/2011  *RADIOLOGY REPORT*  INDICATION:  Chest pain.  CT ANGIOGRAPHY OF THE HEART, CORONARY ARTERY, STRUCTURE, AND MORPHOLOGY  CONTRAST: 80mL OMNIPAQUE IOHEXOL 350 MG/ML SOLN  COMPARISON:  None  TECHNIQUE:  CT angiography of the coronary vessels was performed on a 256 channel system using prospective ECG gating.  A scout and noncontrast exam (for calcium scoring) were performed.  Circulation time was measured using a test bolus.  Coronary CTA was performed with sub mm slice collimation during portions of the cardiac cycle after prior injection of iodinated contrast.  Imaging post processing was performed on an independent workstation creating multiplanar and 3-D images, and quantitative analysis of the heart and coronary arteries.  Note that this exam targets the heart and the chest was not imaged in its entirety.  PREMEDICATION: Lopressor 125 mg, P.O. Lopressor 5 mg, IV Nitroglycerin 0.4 mcg, sublingual.  FINDINGS: Technical quality:  Good.  Heart rate:  53  CORONARY ARTERIES: Left main coronary artery:  Long vessel of average caliber.  No intrinsic disease or stenosis.  Gives rise to the left anterior descending artery and left circumflex artery. Left anterior descending:  Gives rise to septal perforator branches and at least three small average size diagonal branches.  No intrinsic disease or stenosis. Left circumflex:  Gives rise to a large proximal first obtuse marginal branch.  No intrinsic disease or stenosis. Right coronary artery:  Minimal calcified plaque in the proximal to mid RCA.  No significant stenosis. Posterior descending artery:  Arises from the right coronary artery.  No intrinsic disease. Dominance:  Right.  CORONARY CALCIUM: Total Agatston Score:  2.48 MESA database percentile:  57  CARDIAC MEASUREMENTS: Interventricular septum (6 - 12 mm):  10 mm. LV  posterior wall (6 - 12 mm):  11 mm. LV diameter in diastole (35 - 52 mm):  32 mm.  AORTA AND PULMONARY MEASUREMENTS: Aortic root (21 - 40 mm):             21 mm  at the annulus             27 mm  at the sinuses of Valsalva  24 mm  at the sinotubular junction Ascending aorta ( <  40 mm):  36 mm Descending aorta ( <  40 mm):  24 mm Main pulmonary artery:  ( <  30 mm):  25 mm  EXTRACARDIAC FINDINGS: Dependent atelectasis in the lungs bilaterally.  Densities noted in the lingula and left base could represent areas of scarring.  No pleural effusions.  No adenopathy in the visualized inferior mediastinum or hila.  Visualized chest wall soft tissues and upper abdomen unremarkable.  Degenerative changes in the thoracic spine.  IMPRESSION:  1.  Minimal coronary artery disease.  The patient's total coronary artery calcium score is 2.48, which is 57 percentile for patient's matched age and gender. 2.  Minimal calcified plaque in the proximal to mid right coronary artery.  No significant stenosis. 3.  Right coronary artery dominance.  Report was called to Tiffany at 3:20 p.m. on 09/07/2011.  Original Report Authenticated By: Cyndie Chime, M.D.    Scheduled Meds:   . enoxaparin (LOVENOX) injection  1 mg/kg Subcutaneous Q12H  . irbesartan  75 mg Oral Daily  . metoprolol tartrate  25 mg Oral BID  . rosuvastatin  10 mg Oral Daily  . sodium chloride  3 mL Intravenous Q12H  . sodium chloride  3 mL Intravenous Q12H  . warfarin  10 mg Oral ONCE-1800  . warfarin  7.5 mg Oral ONCE-1800  . Warfarin - Pharmacist Dosing Inpatient   Does not apply q1800   Continuous Infusions:   . sodium chloride 100 mL/hr at 09/24/11 2353    Principal Problem:  *Pulmonary embolism Active Problems:  HYPERLIPIDEMIA  ANXIETY  DEPRESSION  MIGRAINE HEADACHE  HYPERTENSION, BENIGN ESSENTIAL  Nausea

## 2011-09-26 DIAGNOSIS — F411 Generalized anxiety disorder: Secondary | ICD-10-CM

## 2011-09-26 DIAGNOSIS — H8110 Benign paroxysmal vertigo, unspecified ear: Secondary | ICD-10-CM

## 2011-09-26 DIAGNOSIS — I2699 Other pulmonary embolism without acute cor pulmonale: Secondary | ICD-10-CM

## 2011-09-26 DIAGNOSIS — J301 Allergic rhinitis due to pollen: Secondary | ICD-10-CM

## 2011-09-26 LAB — PROTIME-INR: Prothrombin Time: 19.9 seconds — ABNORMAL HIGH (ref 11.6–15.2)

## 2011-09-26 MED ORDER — WARFARIN SODIUM 7.5 MG PO TABS
7.5000 mg | ORAL_TABLET | Freq: Once | ORAL | Status: AC
  Administered 2011-09-26: 7.5 mg via ORAL
  Filled 2011-09-26: qty 1

## 2011-09-26 NOTE — Progress Notes (Signed)
VASCULAR LAB PRELIMINARY  PRELIMINARY  PRELIMINARY  PRELIMINARY  Bilateral lower extremity venous duplex  completed.    Preliminary report:  Bilateral:  No evidence of DVT, superficial thrombosis, or Baker's Cyst.   Marylynne Keelin, RVT 09/26/2011, 11:51 AM

## 2011-09-26 NOTE — Progress Notes (Signed)
[facility]                                                                                Patient Demographics  Victoria Campos, is a 61 y.o. female  EAV:[id number]  NWG:[id number]  DOB - Sep 01, 1950  Admit date - 09/23/2011  Admitting Physician [name], MD  Outpatient Primary MD for the patient is MARTIN,NYKEDTRA, NP  LOS - 3   Chief Complaint  Patient presents with  . Flank Pain    right lateral side        Assessment & Plan    1. Pulmonary Embolism - Did detect lupus anticoagulant on hypercoagulation panel. Raising suspicion for hypercoagulable state as cause of PE.  - Continue lovenox with bridging to coumadin started 09/23/2011, will have her follow with hematology upon discharge. - supplemental oxygen and supportive therapy  - Echo results reviewed , will order lower extremity venous Dopplers to look for clot burden. - f/u with care manager consult given no insurance or pcp      2. Nausea  - improved today was able to tolerate po intake  - Continue antiemetic PRN  - Consider further work-up if no resolution      3. Fever  - initial chest x ray showed atelectasis vs infiltrate  - could be secondary to atelectasis versus clot burden, continue incentive spirometry  - acetaminophen for fevers  - Continue levaquin was added to cover for CAP     4. HTN  - Stable will plan on continuing home regimen. Continue to monitor BP's.      5. HPL  - Stable will plan on continuing crestor.     6. Depression  - Patient has no recorded medication in MAR. Currently stable will monitor.    Code Status: Full   Family Communication: No family at bedside   Disposition Plan: Will bridge from lovenox to coumadin and once coumadin at [facility] would consider d/c     Brief narrative:     61 y/o AAF nonsmoker recently admitted for PE. Placed on lovenox and bridging to coumadin. Echo results as below. Consulted care manager because patient has  no insurance or pcp. Spiked a fever and thus was started on levaquin today, may be secondary to atelectasis vs pe but nonetheless wanted to cover for CAP.     Consultants:   none Procedures:    Leg Dopplers   CT Chest-Angiogram   Echocardiogram: Study Conclusions  Left ventricle: The cavity size was normal. Systolic function was vigorous. The estimated ejection fraction was in the range of 65% to 70%. Wall motion was normal; there were no regional wall motion abnormalities. Doppler parameters are consistent with abnormal left ventricular relaxation (grade 1 diastolic dysfunction). Doppler  parameters are consistent with high ventricular filling pressure    Antibiotics:  Levaquin per pharmacy started 7/16    Time Spent 35 mins    Susa Raring K M.D on 09/26/2011 at 11:12 AM  Between 7am to 7pm - Pager - [phone]  After 7pm go to [email or web address] - password [email or web address]  And look for the night coverage person covering for me after hours  [facility] Office  [phone]  Subjective:   Tanna Loeffler today has, No headache, No chest pain, No abdominal pain - No Nausea, No new weakness tingling or numbness, No Cough - SOB.    Objective:   Filed Vitals:   09/26/11 0515 09/26/11 0900 09/26/11 1015 09/26/11 1017  BP: 133/77  125/73   Pulse: 88   93  Temp: 99 F (37.2 C)     TempSrc: Oral     Resp: 18     Height:      Weight:      SpO2: 94% 90% 94%     Wt Readings from Last 3 Encounters:  09/23/11 82.056 kg (180 lb 14.4 oz)  09/07/11 82.555 kg (182 lb)  12/01/09 82.645 kg (182 lb 3.2 oz)     Intake/Output Summary (Last 24 hours) at 09/26/11 1112 Last data filed at 09/26/11 0900  Gross per 24 hour  Intake 2406.67 ml  Output   2800 ml  Net -393.33 ml    Exam Awake Alert, Oriented X 3, No new F.N deficits, Normal affect .AT,PERRAL Supple Neck,No JVD, No cervical lymphadenopathy appriciated.  Symmetrical Chest wall movement, Good air movement  bilaterally, CTAB RRR,No Gallops,Rubs or new Murmurs, No Parasternal Heave +ve B.Sounds, Abd Soft, Non tender, No organomegaly appriciated, No rebound - guarding or rigidity. No Cyanosis, Clubbing or edema, No new Rash or bruise      Data Review   CBC  Lab 09/25/11 0414 09/24/11 0442 09/23/11 1241  WBC 5.2 7.1 8.9  HGB 10.1* 11.1* 13.9  HCT 29.3* 32.8* 39.3  PLT 208 243 289  MCV 82.5 84.5 82.7  MCH 28.5 28.6 29.3  MCHC 34.5 33.8 35.4  RDW 12.1 12.6 12.4  LYMPHSABS -- -- 0.9  MONOABS -- -- 0.7  EOSABS -- -- 0.1  BASOSABS -- -- 0.0  BANDABS -- -- --    Chemistries   Lab 09/24/11 0442 09/23/11 1241  NA 134* 135  K 3.5 3.2*  CL 102 98  CO2 23 24  GLUCOSE 116* 170*  BUN 6 8  CREATININE 0.86 0.75  CALCIUM 8.6 9.7  MG -- --  AST -- 12  ALT -- 12  ALKPHOS -- 99  BILITOT -- 1.3*   ------------------------------------------------------------------------------------------------------------------ estimated creatinine clearance is 70.3 ml/min (by C-G formula based on Cr of 0.86). ------------------------------------------------------------------------------------------------------------------ No results found for this basename: HGBA1C:2 in the last 72 hours ------------------------------------------------------------------------------------------------------------------ No results found for this basename: CHOL:2,HDL:2,LDLCALC:2,TRIG:2,CHOLHDL:2,LDLDIRECT:2 in the last 72 hours ------------------------------------------------------------------------------------------------------------------ No results found for this basename: TSH,T4TOTAL,FREET3,T3FREE,THYROIDAB in the last 72 hours ------------------------------------------------------------------------------------------------------------------ No results found for this basename: VITAMINB12:2,FOLATE:2,FERRITIN:2,TIBC:2,IRON:2,RETICCTPCT:2 in the last 72 hours  Coagulation profile  Lab 09/26/11 0419 09/25/11 0414 09/24/11  1231 09/23/11 1240  INR 1.66* 1.19 1.17 1.04  PROTIME -- -- -- --     Basename 09/23/11 1241  DDIMER 1.15*    Cardiac Enzymes No results found for this basename: CK:3,CKMB:3,TROPONINI:3,MYOGLOBIN:3 in the last 168 hours ------------------------------------------------------------------------------------------------------------------ No components found with this basename: POCBNP:3  Micro Results Recent Results (from the past 240 hour(s))  CULTURE, BLOOD (ROUTINE X 2)     Status: Normal (Preliminary result)   Collection Time   09/23/11  2:00 PM      Component Value Range Status Comment   Specimen Description BLOOD RIGHT ARM   Final    Special Requests BOTTLES DRAWN AEROBIC ONLY 2CC   Final    Culture  Setup Time 09/23/2011 19:21   Final    Culture     Final    Value:  BLOOD CULTURE RECEIVED NO GROWTH TO DATE CULTURE WILL BE HELD FOR 5 DAYS BEFORE ISSUING A FINAL NEGATIVE REPORT   Report Status PENDING   Incomplete   CULTURE, BLOOD (ROUTINE X 2)     Status: Normal (Preliminary result)   Collection Time   09/23/11  2:08 PM      Component Value Range Status Comment   Specimen Description BLOOD LEFT HAND   Final    Special Requests BOTTLES DRAWN AEROBIC AND ANAEROBIC 3CC EACH   Final    Culture  Setup Time 09/23/2011 19:21   Final    Culture     Final    Value:        BLOOD CULTURE RECEIVED NO GROWTH TO DATE CULTURE WILL BE HELD FOR 5 DAYS BEFORE ISSUING A FINAL NEGATIVE REPORT   Report Status PENDING   Incomplete     Radiology Reports Dg Chest 2 View  09/23/2011  *RADIOLOGY REPORT*  Clinical Data: Cough.  Right-sided chest pain.  CHEST - 2 VIEW  Comparison: 09/06/2011  Findings: Mild right lower lobe infiltrate is new since previous study, and suspicious for pneumonia.  No evidence of pleural effusion.  Left lung is clear.  Heart size is stable and within normal limits.  No mass or lymphadenopathy identified.  IMPRESSION: Mild right lower lobe infiltrate, suspicious for  pneumonia. Post- treatment  radiographic followup recommended to confirm resolution.  Original Report Authenticated By: Danae Orleans, M.D.   Dg Chest 2 View  09/06/2011  *RADIOLOGY REPORT*  Clinical Data: Chest pain with shortness of breath and left arm pain.  Hypertension.  CHEST - 2 VIEW  Comparison: None  Findings: Normal heart size with clear lung fields.  No effusion or pneumothorax.  No acute bony abnormality.  Moderate degenerative change thoracic spine.  Prior rotator cuff repair right shoulder.  IMPRESSION: No active cardiopulmonary disease.  Original Report Authenticated By: Elsie Stain, M.D.   Ct Angio Chest W/cm &/or Wo Cm  09/23/2011  *RADIOLOGY REPORT*  Clinical Data: Right-sided pleuritic chest pain and cough. Elevated D-dimer.  CT ANGIOGRAPHY CHEST  Technique:  Multidetector CT imaging of the chest using the standard protocol during bolus administration of intravenous contrast. Multiplanar reconstructed images including MIPs were obtained and reviewed to evaluate the vascular anatomy.  Contrast: OMNIPAQUE IOHEXOL 350 MG/ML SOLN  Comparison: None.  Findings: Bilateral small pulmonary emboli are seen within the bilateral upper lobe and lower lobe segmental pulmonary arteries. No emboli seen in the main or central right or left pulmonary arteries. No evidence of thoracic aortic aneurysm or dissection.  Dependent atelectasis is seen in both lungs with tiny bilateral pleural effusions.  No evidence of pulmonary consolidation or mass. Central tracheobronchial airways are patent.  No evidence of hilar or mediastinal mass.  No pathologically enlarged nodes identified within the thorax.  IMPRESSION:  1.  Acute bilateral segmental pulmonary emboli. 2.  Bilateral dependent atelectasis and tiny pleural effusions.  Critical Value/emergent results were called by telephone at the time of interpretation on 09/23/2011 at 1415 hours to Dr. Fredderick Phenix in the emergency department, who verbally acknowledged  these results.  Original Report Authenticated By: Danae Orleans, M.D.   Ct Heart Morp W/cta Cor W/score W/ca W/cm &/or Wo/cm  09/07/2011  *RADIOLOGY REPORT*  INDICATION:  Chest pain.  CT ANGIOGRAPHY OF THE HEART, CORONARY ARTERY, STRUCTURE, AND MORPHOLOGY  CONTRAST: 80mL OMNIPAQUE IOHEXOL 350 MG/ML SOLN  COMPARISON:  None  TECHNIQUE:  CT angiography of the coronary vessels  was performed on a 256 channel system using prospective ECG gating.  A scout and noncontrast exam (for calcium scoring) were performed.  Circulation time was measured using a test bolus.  Coronary CTA was performed with sub mm slice collimation during portions of the cardiac cycle after prior injection of iodinated contrast.  Imaging post processing was performed on an independent workstation creating multiplanar and 3-D images, and quantitative analysis of the heart and coronary arteries.  Note that this exam targets the heart and the chest was not imaged in its entirety.  PREMEDICATION: Lopressor 125 mg, P.O. Lopressor 5 mg, IV Nitroglycerin 0.4 mcg, sublingual.  FINDINGS: Technical quality:  Good.  Heart rate:  53  CORONARY ARTERIES: Left main coronary artery:  Long vessel of average caliber.  No intrinsic disease or stenosis.  Gives rise to the left anterior descending artery and left circumflex artery. Left anterior descending:  Gives rise to septal perforator branches and at least three small average size diagonal branches.  No intrinsic disease or stenosis. Left circumflex:  Gives rise to a large proximal first obtuse marginal branch.  No intrinsic disease or stenosis. Right coronary artery:  Minimal calcified plaque in the proximal to mid RCA.  No significant stenosis. Posterior descending artery:  Arises from the right coronary artery.  No intrinsic disease. Dominance:  Right.  CORONARY CALCIUM: Total Agatston Score:  2.48 MESA database percentile:  57  CARDIAC MEASUREMENTS: Interventricular septum (6 - 12 mm):  10 mm. LV posterior  wall (6 - 12 mm):  11 mm. LV diameter in diastole (35 - 52 mm):  32 mm.  AORTA AND PULMONARY MEASUREMENTS: Aortic root (21 - 40 mm):             21 mm  at the annulus             27 mm  at the sinuses of Valsalva             24 mm  at the sinotubular junction Ascending aorta ( <  40 mm):  36 mm Descending aorta ( <  40 mm):  24 mm Main pulmonary artery:  ( <  30 mm):  25 mm  EXTRACARDIAC FINDINGS: Dependent atelectasis in the lungs bilaterally.  Densities noted in the lingula and left base could represent areas of scarring.  No pleural effusions.  No adenopathy in the visualized inferior mediastinum or hila.  Visualized chest wall soft tissues and upper abdomen unremarkable.  Degenerative changes in the thoracic spine.  IMPRESSION:  1.  Minimal coronary artery disease.  The patient's total coronary artery calcium score is 2.48, which is 57 percentile for patient's matched age and gender. 2.  Minimal calcified plaque in the proximal to mid right coronary artery.  No significant stenosis. 3.  Right coronary artery dominance.  Report was called to Tiffany at 3:20 p.m. on 09/07/2011.  Original Report Authenticated By: Cyndie Chime, M.D.    Scheduled Meds:   . enoxaparin (LOVENOX) injection  1 mg/kg Subcutaneous Q12H  . irbesartan  75 mg Oral Daily  . levofloxacin (LEVAQUIN) IV  750 mg Intravenous Q24H  . metoprolol tartrate  25 mg Oral BID  . rosuvastatin  10 mg Oral Daily  . sodium chloride  3 mL Intravenous Q12H  . sodium chloride  3 mL Intravenous Q12H  . warfarin  10 mg Oral ONCE-1800  . warfarin  7.5 mg Oral ONCE-1800  . Warfarin - Pharmacist Dosing Inpatient   Does not apply [phone]  Continuous Infusions:   . DISCONTD: sodium chloride 100 mL/hr at 09/26/11 0637   PRN Meds:.sodium chloride, acetaminophen, acetaminophen, docusate sodium, ondansetron (ZOFRAN) IV, ondansetron, oxyCODONE, sodium chloride

## 2011-09-26 NOTE — Progress Notes (Addendum)
ANTICOAGULATION CONSULT NOTE - Follow Up Consult  Pharmacy Consult for Warfarin, Lovenox Indication: pulmonary embolus  Allergies  Allergen Reactions  . Sulfamethoxazole W-Trimethoprim     REACTION: red rash    Patient Measurements: Height: 5\' 4"  (162.6 cm) Weight: 180 lb 14.4 oz (82.056 kg) IBW/kg (Calculated) : 54.7   Vital Signs: Temp: 99 F (37.2 C) (07/17 0515) Temp src: Oral (07/17 0515) BP: 133/77 mmHg (07/17 0515) Pulse Rate: 88  (07/17 0515)  Labs:  Basename 09/26/11 0419 09/25/11 0414 09/24/11 1231 09/24/11 0442 09/23/11 1241 09/23/11 1240  HGB -- 10.1* -- 11.1* -- --  HCT -- 29.3* -- 32.8* 39.3 --  PLT -- 208 -- 243 289 --  APTT -- -- -- -- -- 37  LABPROT 19.9* 15.4* 15.1 -- -- --  INR 1.66* 1.19 1.17 -- -- --  HEPARINUNFRC -- -- -- -- -- --  CREATININE -- -- -- 0.86 0.75 --  CKTOTAL -- -- -- -- -- --  CKMB -- -- -- -- -- --  TROPONINI -- -- -- -- -- --    Estimated Creatinine Clearance: 70.3 ml/min (by C-G formula based on Cr of 0.86).  Assessment:  69 YOF with new bilateral PE on CT (7/14).  Lovenox and Coumadin started.  Coumadin score = 6.  Lupus anticoagulant positive on coagulable panel.  Today is Day#4 of 5 days minimum bridging of bridging.    INR 1.66, increase s/p increased dose overnight.   H/H 10.1/29.3, stable renal function, CrCl 70 ml/min (last 7/16)  Drug-drug interaction with Levaquin noted - can potentially increase INR  NO bleeding/complications noted  Coumadin education provided to patient 7/15.  Goal of Therapy:  INR 2-3 Anti-Xa level 0.6-1.2 units/ml 4hrs after LMWH dose given Monitor platelets by anticoagulation protocol: Yes   Plan:   Coumadin 7.5 mg po x 1 tonight  Continue Lovenox 80 mg sq q12h   Daily PT/INR, pharmacy will f/u  MD: Patient does not have to be here for coumadin bridging until INR therapeutic if patient is otherwise ready for discharge.  Can go home on Lovenox and f/u at [facility].   Thanks.  Geoffry Paradise Thi 09/26/2011,7:57 AM

## 2011-09-27 DIAGNOSIS — F1910 Other psychoactive substance abuse, uncomplicated: Secondary | ICD-10-CM

## 2011-09-27 DIAGNOSIS — R9431 Abnormal electrocardiogram [ECG] [EKG]: Secondary | ICD-10-CM

## 2011-09-27 LAB — BASIC METABOLIC PANEL
BUN: 5 mg/dL — ABNORMAL LOW (ref 6–23)
Creatinine, Ser: 0.67 mg/dL (ref 0.50–1.10)
GFR calc Af Amer: >90 mL/min (ref >90)
GFR calc non Af Amer: >90 mL/min (ref >90)
Glucose, Bld: 101 mg/dL — ABNORMAL HIGH (ref 70–99)

## 2011-09-27 LAB — CBC
HCT: 28.8 % — ABNORMAL LOW (ref 36.0–46.0)
Hemoglobin: 10.2 g/dL — ABNORMAL LOW (ref 12.0–15.0)
MCH: 28.7 pg (ref 26.0–34.0)
MCHC: 35.4 g/dL (ref 30.0–36.0)
MCV: 80.9 fL (ref 78.0–100.0)
RDW: 12 % (ref 11.5–15.5)

## 2011-09-27 MED ORDER — ENOXAPARIN SODIUM 150 MG/ML ~~LOC~~ SOLN: 80.0000 mg | Freq: Two times a day (BID) | SUBCUTANEOUS | Status: DC

## 2011-09-27 MED ORDER — WARFARIN SODIUM 7.5 MG PO TABS: 7.5000 mg | ORAL_TABLET | Freq: Once | ORAL | Status: DC

## 2011-09-27 MED ORDER — LEVOFLOXACIN 750 MG PO TABS
750.0000 mg | ORAL_TABLET | ORAL | Status: DC
  Filled 2011-09-27: qty 1

## 2011-09-27 MED ORDER — AMOXICILLIN-POT CLAVULANATE 500-125 MG PO TABS: 1.0000 | ORAL_TABLET | Freq: Three times a day (TID) | ORAL | Status: DC

## 2011-09-27 MED ORDER — WARFARIN SODIUM 10 MG PO TABS
10.0000 mg | ORAL_TABLET | Freq: Once | ORAL | Status: DC
  Filled 2011-09-27: qty 1

## 2011-09-27 MED ORDER — WARFARIN SODIUM 7.5 MG PO TABS
7.5000 mg | ORAL_TABLET | Freq: Once | ORAL | Status: DC
  Filled 2011-09-27: qty 1

## 2011-09-27 NOTE — Progress Notes (Signed)
ANTICOAGULATION/ANTIBIOTIC CONSULT NOTE - Follow Up Consult  Pharmacy Consult for Warfarin, Lovenox Indication: pulmonary embolus  Pharmacy Consult for Levaquin Indication: CAP  Allergies  Allergen Reactions  . Sulfamethoxazole W-Trimethoprim     REACTION: red rash    Patient Measurements: Height: 5\' 4"  (162.6 cm) Weight: 180 lb 14.4 oz (82.056 kg) IBW/kg (Calculated) : 54.7   Vital Signs: Temp: 98.9 F (37.2 C) (07/18 0550) Temp src: Oral (07/18 0550) BP: 152/81 mmHg (07/18 0927) Pulse Rate: 84  (07/18 0927)  Labs:  Basename 09/27/11 0448 09/26/11 0419 09/25/11 0414  HGB 10.2* -- 10.1*  HCT 28.8* -- 29.3*  PLT 230 -- 208  APTT -- -- --  LABPROT 21.0* 19.9* 15.4*  INR 1.78* 1.66* 1.19  HEPARINUNFRC -- -- --  CREATININE 0.67 -- --  CKTOTAL -- -- --  CKMB -- -- --  TROPONINI -- -- --    Estimated Creatinine Clearance: 75.6 ml/min (by C-G formula based on Cr of 0.67).  Assessment:  20 YOF with new bilateral PE on CT (7/14).  Lovenox and Coumadin started.  Coumadin score = 6.  Lupus anticoagulant positive on coagulable panel.  Today is Day#5 of 5 days minimum bridging. Per CHEST  Guidelines, bridge for at least 5 days AND INR > 2 for 48 hours.  INR 1.78 today.  H/H low but stable.  Stable renal function (CrCl 75 ml/min).  Drug-drug interaction with Levaquin noted - can potentially increase INR  NO bleeding/complications noted  Coumadin education provided to patient 7/15.  Antibiotic  Patient is on D3 Levaquin for r/o CAP.  No new CXR since 7/14.  Tmax 100.2, WBC wnl, CrCl 75 ml/min  7/14 blood cultures x 2 NGTD.   Nausea - improved and tolerating PO intake - will change abx to PO.   Goal of Therapy:  INR 2-3 Anti-Xa level 0.6-1.2 units/ml 4hrs after LMWH dose given Monitor platelets by anticoagulation protocol: Yes   Plan:   Coumadin 10 mg po x 1 tonight  Continue Lovenox 80 mg sq q12h   Daily PT/INR, pharmacy will f/u  If patient stable for  discharge, consider going home on Lovenox with Coumadin 7.5 mg po daily and close PT/INR follow up.    Antibiotic:  Change Levaquin to 750 mg PO q24h  Geoffry Paradise Thi 09/27/2011,10:31 AM   Addendum:   Dr. Thedore Mins discharging patient today on 5 days of Lovenox from Pharmacy indigent fund.  Patient with PT/INR check on Monday so Dr. Thedore Mins prefer patient's INR to not go up too high.  Will change Coumadin dose tonight to 7.5 mg and recommended MD to discharge patient on 7.5 mg po daily until INR check.   Geoffry Paradise, PharmD, BCPS Pager: [phone] 10:33 AM

## 2011-09-27 NOTE — Discharge Summary (Addendum)
[facility]                                                                                   Victoria Campos, is a 61 y.o. female  DOB [birth year]  MRN [id number].  Admission date:  09/23/2011  Discharge Date:  09/27/2011  Primary MD  Lehman Prom, NP  Admitting Physician  [name], MD  Admission Diagnosis  Pulmonary emboli [415.19] flank pain  Discharge Diagnosis     Principal Problem:  *Pulmonary embolism Active Problems:  HYPERLIPIDEMIA  ANXIETY  DEPRESSION  MIGRAINE HEADACHE  HYPERTENSION, BENIGN ESSENTIAL  Nausea  Fever    Past Medical History  Diagnosis Date  . Hypertension   . Hypercholesteremia   . Heart murmur     History reviewed. No pertinent past surgical history.  Recommendations for primary care physician for things to follow:  CBC BMP INR in 3-4 days. Two-view chest x-ray in 3-4 days.    Discharge Diagnoses:   Principal Problem:  *Pulmonary embolism Active Problems:  HYPERLIPIDEMIA  ANXIETY  DEPRESSION  MIGRAINE HEADACHE  HYPERTENSION, BENIGN ESSENTIAL  Nausea  Fever    Discharge Condition: Stable  Diet recommendation: See Discharge Instructions below  History of present illness and  Hospital Course:  See H&P, Labs, Consult and Test reports for all details in brief, patient was admitted for     1. Pulmonary Embolism  - Did detect lupus anticoagulant on hypercoagulation panel. Raising suspicion for hypercoagulable state as cause of PE.  - Continue lovenox with bridging to coumadin started 09/23/2011, will have her follow with hematology upon discharge.  - Off of supplemental oxygen and feels much better, Leg [place] -ve for clots - Echo results reviewed    -outpt by PCP and hematology.    2. Nausea  - resolved, although rating oral diet without any problems.     3. Fever  Likely due to clot burden versus community-acquired pneumonia(? Aspiration in the light of nausea and vomiting upon admission), will  place her on Augmentin upon discharge as patient does not have good insurance and says cannot buy expensive medications. She is much better clinically culture so far negative she is off of oxygen.     4. HTN  - Stable will plan on continuing home regimen. Continue to monitor BP's.     5. HPL  - Stable will plan on continuing crestor.     6. Depression  - Patient has no recorded medication in MAR. Currently stable will monitor.     7. Incidental finding of chronic diastolic CHF an echogram. Clinically compensated, continue outpatient risk factor modulation i.e. blood pressure diabetes weight management.     Discharge Instructions     Follow with Primary MD MARTIN,NYKEDTRA, NP in 3 days,  get a 2 view chest x-ray that visit.  Get CBC, CMP, INR checked 3 days by Primary MD and again as instructed by your Primary MD.    Get Medicines reviewed and adjusted.  Please request your Prim.MD to go over all Hospital Tests and Procedure/Radiological results at the follow up, please get all Hospital records sent to your Prim MD by signing hospital release before you go  home.  Activity: As tolerated with Full fall precautions use walker/cane & assistance as needed   Diet:  Heart healthy  For Heart failure patients - Check your Weight same time everyday, if you gain over 2 pounds, or you develop in leg swelling, experience more shortness of breath or chest pain, call your Primary MD immediately. Follow Cardiac Low Salt Diet and 1.8 lit/day fluid restriction.  Disposition Home    If you experience worsening of your admission symptoms, develop shortness of breath, life threatening emergency, suicidal or homicidal thoughts you must seek medical attention immediately by calling 911 or calling your MD immediately  if symptoms less severe.  You Must read complete instructions/literature along with all the possible adverse reactions/side effects for all the Medicines you take and that have  been prescribed to you. Take any new Medicines after you have completely understood and accpet all the possible adverse reactions/side effects.   Do not drive if your were admitted for syncope or siezures until you have seen by Primary MD or a Neurologist and advised to drive.  Do not drive when taking Pain medications.    Do not take more than prescribed Pain, Sleep and Anxiety Medications  Special Instructions: If you have smoked or chewed Tobacco  in the last 2 yrs please stop smoking, stop any regular Alcohol  and or any Recreational drug use.  Wear Seat belts while driving.  Follow-up Information    Follow up with [facility] on 10/01/2011. (at 3:30pm with Dr Clelia Croft Orinda Kenner blood work will be done)       Follow up with Levert Feinstein, MD. Schedule an appointment as soon as possible for a visit in 2 weeks.   Contact information:   [address] [place] [address] [phone]           Today   Subjective:   Bernarda Erck today has no headache,no chest abdominal pain,no new weakness tingling or numbness, feels much better wants to go home today.   Objective:   Blood pressure 152/81, pulse 84, temperature 98.9 F (37.2 C), temperature source Oral, resp. rate 16, height 5\' 4"  (1.626 m), weight 82.056 kg (180 lb 14.4 oz), SpO2 93.00%.   Intake/Output Summary (Last 24 hours) at 09/27/11 1158 Last data filed at 09/27/11 0500  Gross per 24 hour  Intake    240 ml  Output   1075 ml  Net   -835 ml    Exam Awake Alert, Oriented *3, No new F.N deficits, Normal affect [place].AT,PERRAL Supple Neck,No JVD, No cervical lymphadenopathy appriciated.  Symmetrical Chest wall movement, Good air movement bilaterally, CTAB RRR,No Gallops,Rubs or new Murmurs, No Parasternal Heave +ve B.Sounds, Abd Soft, Non tender, No organomegaly appriciated, No rebound -guarding or rigidity. No Cyanosis, Clubbing or edema, No new Rash or bruise     Radiology Reports - please see full  reports for all details  Echo Left ventricle: The cavity size was normal. Systolic function was vigorous. The estimated ejection fraction was in the range of 65% to 70%. Wall motion was normal; there were no regional wall motion abnormalities. Doppler parameters are consistent with abnormal left ventricular relaxation (grade 1 diastolic dysfunction). Doppler parameters are consistent with high ventricular filling pressure.     Dg Chest 2 View  09/23/2011  *RADIOLOGY REPORT*  Clinical Data: Cough.  Right-sided chest pain.  CHEST - 2 VIEW  Comparison: 09/06/2011  Findings: Mild right lower lobe infiltrate is new since previous study, and suspicious for  pneumonia.  No evidence of pleural effusion.  Left lung is clear.  Heart size is stable and within normal limits.  No mass or lymphadenopathy identified.  IMPRESSION: Mild right lower lobe infiltrate, suspicious for pneumonia. Post- treatment  radiographic followup recommended to confirm resolution.  Original Report Authenticated By: Danae Orleans, M.D.   Dg Chest 2 View  09/06/2011  *RADIOLOGY REPORT*  Clinical Data: Chest pain with shortness of breath and left arm pain.  Hypertension.  CHEST - 2 VIEW  Comparison: None  Findings: Normal heart size with clear lung fields.  No effusion or pneumothorax.  No acute bony abnormality.  Moderate degenerative change thoracic spine.  Prior rotator cuff repair right shoulder.  IMPRESSION: No active cardiopulmonary disease.  Original Report Authenticated By: Elsie Stain, M.D.   Ct Angio Chest W/cm &/or Wo Cm  09/23/2011  *RADIOLOGY REPORT*  Clinical Data: Right-sided pleuritic chest pain and cough. Elevated D-dimer.  CT ANGIOGRAPHY CHEST  Technique:  Multidetector CT imaging of the chest using the standard protocol during bolus administration of intravenous contrast. Multiplanar reconstructed images including MIPs were obtained and reviewed to evaluate the vascular anatomy.  Contrast: OMNIPAQUE  IOHEXOL 350 MG/ML SOLN  Comparison: None.  Findings: Bilateral small pulmonary emboli are seen within the bilateral upper lobe and lower lobe segmental pulmonary arteries. No emboli seen in the main or central right or left pulmonary arteries. No evidence of thoracic aortic aneurysm or dissection.  Dependent atelectasis is seen in both lungs with tiny bilateral pleural effusions.  No evidence of pulmonary consolidation or mass. Central tracheobronchial airways are patent.  No evidence of hilar or mediastinal mass.  No pathologically enlarged nodes identified within the thorax.  IMPRESSION:  1.  Acute bilateral segmental pulmonary emboli. 2.  Bilateral dependent atelectasis and tiny pleural effusions.  Critical Value/emergent results were called by telephone at the time of interpretation on 09/23/2011 at 1415 hours to Dr. Fredderick Phenix in the emergency department, who verbally acknowledged these results.  Original Report Authenticated By: Danae Orleans, M.D.       Micro Results  Recent Results (from the past 240 hour(s))  CULTURE, BLOOD (ROUTINE X 2)     Status: Normal (Preliminary result)   Collection Time   09/23/11  2:00 PM      Component Value Range Status Comment   Specimen Description BLOOD RIGHT ARM   Final    Special Requests BOTTLES DRAWN AEROBIC ONLY 2CC   Final    Culture  Setup Time 09/23/2011 19:21   Final    Culture     Final    Value:        BLOOD CULTURE RECEIVED NO GROWTH TO DATE CULTURE WILL BE HELD FOR 5 DAYS BEFORE ISSUING A FINAL NEGATIVE REPORT   Report Status PENDING   Incomplete   CULTURE, BLOOD (ROUTINE X 2)     Status: Normal (Preliminary result)   Collection Time   09/23/11  2:08 PM      Component Value Range Status Comment   Specimen Description BLOOD LEFT HAND   Final    Special Requests BOTTLES DRAWN AEROBIC AND ANAEROBIC 3CC EACH   Final    Culture  Setup Time 09/23/2011 19:21   Final    Culture     Final    Value:        BLOOD CULTURE RECEIVED NO GROWTH TO DATE CULTURE  WILL BE HELD FOR 5 DAYS BEFORE ISSUING A FINAL NEGATIVE REPORT   Report  Status PENDING   Incomplete      CBC w Diff: Lab Results  Component Value Date   WBC 4.7 09/27/2011   HGB 10.2* 09/27/2011   HCT 28.8* 09/27/2011   PLT 230 09/27/2011   LYMPHOPCT 11* 09/23/2011   MONOPCT 8 09/23/2011   EOSPCT 1 09/23/2011   BASOPCT 0 09/23/2011    CMP: Lab Results  Component Value Date   NA 134* 09/27/2011   K 3.4* 09/27/2011   CL 98 09/27/2011   CO2 27 09/27/2011   BUN 5* 09/27/2011   CREATININE 0.67 09/27/2011   PROT 8.2 09/23/2011   ALBUMIN 3.9 09/23/2011   BILITOT 1.3* 09/23/2011   ALKPHOS 99 09/23/2011   AST 12 09/23/2011   ALT 12 09/23/2011  .  Lab Results  Component Value Date   INR 1.78* 09/27/2011   INR 1.66* 09/26/2011   INR 1.19 09/25/2011      Discharge Medications  Medication List  As of 09/27/2011 12:07 PM   START taking these medications         amoxicillin-clavulanate 500-125 MG per tablet   Commonly known as: AUGMENTIN   Take 1 tablet (500 mg total) by mouth 3 (three) times daily.      enoxaparin 150 MG/ML injection   Commonly known as: LOVENOX   Inject 0.53 mLs (80 mg total) into the skin every 12 (twelve) hours. Get your INR checked in 3 days by primary care physician, you should take Lovenox for more days after your INR is above 2 then stop.      warfarin 7.5 MG tablet   Commonly known as: COUMADIN   Take 1 tablet (7.5 mg total) by mouth one time only at 6 PM. Get your INR checked on Monday by primary care physician and Coumadin and Lovenox dose adjusted.         CONTINUE taking these medications         aspirin 81 MG chewable tablet      metoprolol 50 MG tablet   Commonly known as: LOPRESSOR      rosuvastatin 20 MG tablet   Commonly known as: CRESTOR      telmisartan-hydrochlorothiazide 80-25 MG per tablet   Commonly known as: MICARDIS HCT          Where to get your medications    These are the prescriptions that you need to pick up.   You may get these  medications from any pharmacy.         amoxicillin-clavulanate 500-125 MG per tablet   enoxaparin 150 MG/ML injection   warfarin 7.5 MG tablet           Discharge Orders    Future Orders Please Complete By Expires   Diet - low sodium heart healthy      Increase activity slowly         Total Time in preparing paper work, data evaluation and todays exam - 35 minutes  Leroy Sea M.D on 09/27/2011 at 11:58 AM  [facility] Office  [phone]

## 2011-09-27 NOTE — Progress Notes (Signed)
Followed up with MD about duplicate lovenox prescription. Patient is receiving it from pharmacy and did not need other prescription. Voided duplicate prescription per Dr. Thedore Mins. Setzer, Don Broach

## 2011-09-27 NOTE — Progress Notes (Deleted)
ANTICOAGULATION/ANTIBIOTIC CONSULT NOTE - Follow Up Consult  Pharmacy Consult for Warfarin, Lovenox Indication: pulmonary embolus  Pharmacy Consult for Levaquin Indication: CAP  Allergies  Allergen Reactions  . Sulfamethoxazole W-Trimethoprim     REACTION: red rash    Patient Measurements: Height: 5\' 4"  (162.6 cm) Weight: 180 lb 14.4 oz (82.056 kg) IBW/kg (Calculated) : 54.7   Vital Signs: Temp: 98.9 F (37.2 C) (07/18 0550) Temp src: Oral (07/18 0550) BP: 152/81 mmHg (07/18 0927) Pulse Rate: 84  (07/18 0927)  Labs:  Basename 09/27/11 0448 09/26/11 0419 09/25/11 0414  HGB 10.2* -- 10.1*  HCT 28.8* -- 29.3*  PLT 230 -- 208  APTT -- -- --  LABPROT 21.0* 19.9* 15.4*  INR 1.78* 1.66* 1.19  HEPARINUNFRC -- -- --  CREATININE 0.67 -- --  CKTOTAL -- -- --  CKMB -- -- --  TROPONINI -- -- --    Estimated Creatinine Clearance: 75.6 ml/min (by C-G formula based on Cr of 0.67).  Assessment:  27 YOF with new bilateral PE on CT (7/14).  Lovenox and Coumadin started.  Coumadin score = 6.  Lupus anticoagulant positive on coagulable panel.  Today is Day#5 of 5 days minimum bridging. Per CHEST  Guidelines, bridge for at least 5 days AND INR > 2 for 48 hours.  INR 1.78 today.  H/H low but stable.  Stable renal function (CrCl 75 ml/min).  Drug-drug interaction with Levaquin noted - can potentially increase INR  NO bleeding/complications noted  Coumadin education provided to patient 7/15.  Antibiotic  Patient is on D3 Levaquin for r/o CAP.  No new CXR since 7/14.  Tmax 100.2, WBC wnl, CrCl 75 ml/min  7/14 blood cultures x 2 NGTD.   Nausea - improved and tolerating PO intake - will change abx to PO.   Goal of Therapy:  INR 2-3 Anti-Xa level 0.6-1.2 units/ml 4hrs after LMWH dose given Monitor platelets by anticoagulation protocol: Yes   Plan:   Coumadin 10 mg po x 1 tonight  Continue Lovenox 80 mg sq q12h   Daily PT/INR, pharmacy will f/u  If patient stable for  discharge, consider going home on Lovenox with Coumadin 7.5 mg po daily and close PT/INR follow up.    Antibiotic:  Change Levaquin to 750 mg PO q24h  Geoffry Paradise Thi 09/27/2011,9:47 AM

## 2011-09-29 LAB — CULTURE, BLOOD (ROUTINE X 2)
Culture: NO GROWTH
Culture: NO GROWTH

## 2011-10-01 ENCOUNTER — Telehealth: Payer: Self-pay | Admitting: *Deleted

## 2011-10-01 NOTE — Telephone Encounter (Signed)
patient called in and confirmed appointment 10-19-2011 starting at 12:00pm

## 2011-10-03 ENCOUNTER — Other Ambulatory Visit (HOSPITAL_COMMUNITY): Payer: Self-pay | Admitting: Family Medicine

## 2011-10-03 DIAGNOSIS — Z1231 Encounter for screening mammogram for malignant neoplasm of breast: Secondary | ICD-10-CM

## 2011-10-08 ENCOUNTER — Other Ambulatory Visit (HOSPITAL_COMMUNITY): Payer: Self-pay | Admitting: Family Medicine

## 2011-10-08 ENCOUNTER — Ambulatory Visit (HOSPITAL_COMMUNITY)
Admission: RE | Admit: 2011-10-08 | Discharge: 2011-10-08 | Disposition: A | Discharge status: Home / Self Care | Payer: Self-pay | Source: Ambulatory Visit | Attending: Family Medicine | Admitting: Family Medicine

## 2011-10-08 DIAGNOSIS — R52 Pain, unspecified: Secondary | ICD-10-CM

## 2011-10-08 DIAGNOSIS — R0602 Shortness of breath: Secondary | ICD-10-CM | POA: Insufficient documentation

## 2011-10-17 ENCOUNTER — Encounter: Payer: Self-pay | Admitting: Internal Medicine

## 2011-10-17 ENCOUNTER — Encounter: Payer: Self-pay | Admitting: *Deleted

## 2011-10-18 ENCOUNTER — Encounter: Payer: Self-pay | Admitting: Internal Medicine

## 2011-10-18 ENCOUNTER — Ambulatory Visit (INDEPENDENT_AMBULATORY_CARE_PROVIDER_SITE_OTHER): Payer: Self-pay | Admitting: Internal Medicine

## 2011-10-18 VITALS — BP 122/70 | HR 63 | Temp 97.0°F | Ht 64.0 in | Wt 176.6 lb

## 2011-10-18 DIAGNOSIS — I2699 Other pulmonary embolism without acute cor pulmonale: Secondary | ICD-10-CM

## 2011-10-18 NOTE — Patient Instructions (Addendum)
You need a follow up office visit with  cxr in one month unless you start having symptoms shortness, cough, pain when breathing.

## 2011-10-18 NOTE — Progress Notes (Signed)
  Subjective:    Patient ID: Victoria Campos, female    DOB: Feb 14, 1951  MRN: [id number]  HPI  41 yobf never regular smoker acutely ill 5 days before going ER with dx of pe [facility]   10/18/2011 1st pulmonary cc short of breath onset 5 days before admit:  Admission date: 09/23/2011  Discharge Date: 09/27/2011  Primary MD Lehman Prom, NP  Admitting Physician [name], MD  Admission Diagnosis Pulmonary emboli [415.19]  flank pain  Discharge Diagnosis  Principal Problem:  *Pulmonary embolism - Pos Lupus anticoaglulant - Neg venous dopplers and Echo s RH strain  Active Problems:  HYPERLIPIDEMIA  ANXIETY  DEPRESSION  MIGRAINE HEADACHE  HYPERTENSION, BENIGN ESSENTIAL  Nausea  Fever       Feeling 100% better @ 10/18/11 ov but ? cxr abn therefore requested eval by Dr Clelia Croft.  No longer cp, cough, sob. No leg swelling  Sleeping ok without nocturnal  or early am exacerbation  of respiratory  c/o's or need for noct saba. Also denies any obvious fluctuation of symptoms with weather or environmental changes or other aggravating or alleviating factors except as outlined above         Review of Systems  Constitutional: Positive for appetite change. Negative for fever, chills, diaphoresis, activity change, fatigue and unexpected weight change.  HENT: Negative for ear pain, nosebleeds, congestion, sore throat, rhinorrhea, sneezing, trouble swallowing, postnasal drip, sinus pressure and tinnitus.   Eyes: Negative for visual disturbance.  Respiratory: Positive for shortness of breath. Negative for cough, choking, chest tightness and wheezing.   Cardiovascular: Negative for chest pain, palpitations and leg swelling.  Gastrointestinal: Negative for nausea, vomiting, constipation and blood in stool.  Genitourinary: Negative for dysuria and difficulty urinating.  Musculoskeletal: Negative for myalgias, arthralgias and gait problem.  Skin: Negative for rash.  Neurological:  Positive for dizziness, light-headedness, numbness and headaches. Negative for seizures, syncope, speech difficulty and weakness.  Hematological: Does not bruise/bleed easily.  Psychiatric/Behavioral: Negative for confusion, disturbed wake/sleep cycle and agitation. The patient is not nervous/anxious.        Objective:   Physical Exam  Wt Readings from Last 3 Encounters:  10/18/11 176 lb 9.6 oz (80.105 kg)  09/23/11 180 lb 14.4 oz (82.056 kg)  09/07/11 182 lb (82.555 kg)    amb bf nad  HEENT: nl dentition, turbinates, and orophanx. Nl external ear canals without cough reflex   NECK :  without JVD/Nodes/TM/ nl carotid upstrokes bilaterally   LUNGS: no acc muscle use, clear to A and P bilaterally without cough on insp or exp maneuvers   CV:  RRR  no s3 or murmur or increase in P2, no edema   ABD:  soft and nontender with nl excursion in the supine position. No bruits or organomegaly, bowel sounds nl  MS:  warm without deformities, calf tenderness, cyanosis or clubbing  SKIN: warm and dry without lesions    NEURO:  alert, approp, no deficits        Assessment & Plan:

## 2011-10-19 ENCOUNTER — Other Ambulatory Visit: Payer: Self-pay | Admitting: Lab

## 2011-10-19 ENCOUNTER — Ambulatory Visit: Payer: Self-pay | Admitting: Oncology

## 2011-10-21 NOTE — Assessment & Plan Note (Addendum)
Dx 09/23/11 with Pos lupus anticoagulant, neg venous dopplers, echo s R Ht strain  cxr reviewed and c/w resolving infarct, no effusions.  Rec f/u cxr in one month unless new symptoms.

## 2011-10-23 ENCOUNTER — Ambulatory Visit (HOSPITAL_COMMUNITY): Payer: Self-pay | Attending: Family Medicine

## 2011-10-23 ENCOUNTER — Encounter (HOSPITAL_COMMUNITY): Payer: Self-pay | Admitting: Emergency Medicine

## 2011-10-23 ENCOUNTER — Emergency Department (HOSPITAL_COMMUNITY)
Admission: EM | Admit: 2011-10-23 | Discharge: 2011-10-23 | Disposition: A | Discharge status: Home / Self Care | Payer: Self-pay | Source: Home / Self Care | Attending: Emergency Medicine | Admitting: Emergency Medicine

## 2011-10-23 DIAGNOSIS — Z7901 Long term (current) use of anticoagulants: Secondary | ICD-10-CM

## 2011-10-23 NOTE — ED Provider Notes (Signed)
History     CSN: [id number]  Arrival date & time 10/23/11  1753   First MD Initiated Contact with Patient 10/23/11 1828      Chief Complaint  Patient presents with  . Labs Only    (Consider location/radiation/quality/duration/timing/severity/associated sxs/prior treatment) HPI Comments: Patient is here for INR check. She was discharged last month from hospital with bilateral PE, and is in the process of having her Coumadin adjusted weekly by [facility]. Her INR last week was 7, and she's been off of her Coumadin for 4 days. She currently has no complaints. No nausea, vomiting, hemoptysis, epistaxis, hematuria, abdominal pain, melena, hematochezia, excessive bruising. No chest pain, shortness of breath.  ROS as noted in HPI. All other ROS negative.   The history is provided by the patient. No language interpreter was used.    Past Medical History  Diagnosis Date  . Hypertension   . Hypercholesteremia   . Heart murmur   . Insomnia   . PE (pulmonary embolism)     Past Surgical History  Procedure Date  . [place]     removed  . Tubal ligation   . Arm surgery     torn ligamint    Family History  Problem Relation Age of Onset  . Cancer Brother     colon  . Cancer Sister     colon  . Cancer Sister     breast  . Heart attack Father   . Heart disease Father     History  Substance Use Topics  . Smoking status: Former Smoker    Types: Cigarettes    Quit date: 03/12/1968  . Smokeless tobacco: Never Used  . Alcohol Use: 0.6 oz/week    1 Glasses of wine per week     occasional    OB History    Grav Para Term Preterm Abortions TAB SAB Ect Mult Living                  Review of Systems  Allergies  Sulfamethoxazole w-trimethoprim  Home Medications   Current Outpatient Rx  Name Route Sig Dispense Refill  . ASPIRIN 81 MG PO CHEW Oral Chew 81 mg by mouth daily.    Marland Kitchen METOPROLOL TARTRATE 50 MG PO TABS Oral Take 50 mg by mouth 2 (two) times daily.    Marland Kitchen  NIFEDIPINE ER OSMOTIC 60 MG PO TB24 Oral Take 60 mg by mouth daily.    Marland Kitchen ROSUVASTATIN CALCIUM 20 MG PO TABS Oral Take 20 mg by mouth at bedtime.    . TELMISARTAN-HCTZ 80-25 MG PO TABS Oral Take 1 tablet by mouth daily.    . TRAZODONE HCL 50 MG PO TABS Oral Take 50 mg by mouth at bedtime.    . WARFARIN SODIUM 7.5 MG PO TABS Oral Take 1 tablet (7.5 mg total) by mouth one time only at 6 PM. Get your INR checked on Monday by primary care physician and Coumadin and Lovenox dose adjusted. 5 tablet 0    BP 135/66  Pulse 71  Temp 99.1 F (37.3 C) (Oral)  Resp 18  SpO2 100%  Physical Exam  Nursing note and vitals reviewed. Constitutional: She is oriented to person, place, and time. She appears well-developed and well-nourished. No distress.  HENT:  Head: Normocephalic and atraumatic.  Eyes: Conjunctivae and EOM are normal.  Neck: Normal range of motion.  Cardiovascular: Normal rate.   Pulmonary/Chest: Effort normal.  Abdominal: She exhibits no distension.  Musculoskeletal: Normal range of motion.  Neurological: She is alert and oriented to person, place, and time. Coordination normal.  Skin: Skin is warm and dry.  Psychiatric: She has a normal mood and affect. Her behavior is normal. Judgment and thought content normal.    ED Course  Procedures (including critical care time)  Labs Reviewed  PROTIME-INR - Abnormal; Notable for the following:    Prothrombin Time 25.5 (*)     INR 2.28 (*)     All other components within normal limits   No results found.   1. Long term current use of anticoagulants with INR goal of 2.0-3.0     MDM  Has been off of her coumadin for the past 4 days as INR was found to be supratherapeutic at 7. Plan that pt has brought with her is to have her alternate full dose/half dose coumadin- currently 7.5 mg on one day and then take half of a 7.5 mg pill the next day. Provided referral to [facility]. Will have her continue her original plan and have  pt return next Tues for coumadin check if she is unable to get into the clinic  Will need to call Toni Amend, who is currently managing pt's coumadin at [phone] x [phone].   Luiz Blare, MD 10/23/11 2056

## 2011-10-23 NOTE — ED Notes (Signed)
Patient sent from [facility] to have routine inr drawn.

## 2011-10-30 ENCOUNTER — Emergency Department (HOSPITAL_COMMUNITY)
Admission: EM | Admit: 2011-10-30 | Discharge: 2011-10-30 | Disposition: A | Discharge status: Home / Self Care | Payer: Self-pay | Source: Home / Self Care | Attending: Emergency Medicine | Admitting: Emergency Medicine

## 2011-10-30 ENCOUNTER — Encounter (HOSPITAL_COMMUNITY): Payer: Self-pay | Admitting: *Deleted

## 2011-10-30 DIAGNOSIS — E785 Hyperlipidemia, unspecified: Secondary | ICD-10-CM

## 2011-10-30 DIAGNOSIS — I10 Essential (primary) hypertension: Secondary | ICD-10-CM

## 2011-10-30 DIAGNOSIS — I2699 Other pulmonary embolism without acute cor pulmonale: Secondary | ICD-10-CM

## 2011-10-30 LAB — PROTIME-INR
INR: 1.53 — ABNORMAL HIGH (ref 0.00–1.49)
Prothrombin Time: 18.7 seconds — ABNORMAL HIGH (ref 11.6–15.2)

## 2011-10-30 MED ORDER — METOPROLOL TARTRATE 50 MG PO TABS: 50.0000 mg | ORAL_TABLET | Freq: Every day | ORAL | Status: DC

## 2011-10-30 MED ORDER — WARFARIN SODIUM 7.5 MG PO TABS: ORAL_TABLET | ORAL | Status: DC

## 2011-10-30 MED ORDER — ROSUVASTATIN CALCIUM 20 MG PO TABS: 20.0000 mg | ORAL_TABLET | Freq: Every day | ORAL | Status: DC

## 2011-10-30 MED ORDER — TRAZODONE HCL 50 MG PO TABS: 50.0000 mg | ORAL_TABLET | Freq: Every day | ORAL | Status: DC

## 2011-10-30 MED ORDER — TELMISARTAN-HCTZ 80-25 MG PO TABS: 1.0000 | ORAL_TABLET | Freq: Every day | ORAL | Status: DC

## 2011-10-30 NOTE — ED Provider Notes (Signed)
Chief Complaint  Patient presents with  . Medication Management    History of Present Illness:   The patient is a 61 year old female, a prior patient of [facility]. She comes in today for an INR check. She's been on Coumadin for a month for bilateral pulmonary emboli and. She currently takes Coumadin 7.5 mg, one on Sunday, Tuesday, and Thursday, and a half a pill on Monday, Wednesday, Friday, and Saturday. She was here last week and at that time her INR was 2.28 and she was told to stay on the same medications. There is a note on her chart about getting her into the [place] [facility], but she has not heard anything further about this. She denies any chest pain, tightness, pressure, shortness of breath, or hemoptysis.  She also has a number of other chronic medical problems and will need refills on her medications. She has high blood pressure and is on my cardia/HCTZ and metoprolol. She denies any medication side effects and has had no headaches, dizziness, shortness of breath, chest pain, tightness, pressure, syncope, or ankle edema. She does check her blood pressure at home is always been within good range. She also has hyperlipidemia and is on Crestor 20 mg per day. She denies any medication side effects, muscle aches, or cramping.  Review of Systems:  Other than noted above, the patient denies any of the following symptoms. Systemic:  No fever, chills, sweats, fatigue, myalgias, headache, or anorexia. Eye:  No redness, pain or drainage. ENT:  No earache, nasal congestion, rhinorrhea, sinus pressure, or sore throat. Lungs:  No cough, sputum production, wheezing, shortness of breath.  Cardiovascular:  No chest pain, palpitations, or syncope. GI:  No nausea, vomiting, abdominal pain or diarrhea. GU:  No dysuria, frequency, or hematuria. Skin:  No rash or pruritis.  PMFSH:  Past medical history, family history, social history, meds, and allergies were reviewed.    Physical Exam:   Vital signs:  BP 137/74  Pulse 92  Temp 98.2 F (36.8 C) (Oral)  Resp 16  SpO2 98% General:  Alert, in no distress. Eye:  PERRL, full EOMs.  Lids and conjunctivas were normal. ENT:  TMs and canals were normal, without erythema or inflammation.  Nasal mucosa was clear and uncongested, without drainage.  Mucous membranes were moist.  Pharynx was clear, without exudate or drainage.  There were no oral ulcerations or lesions. Neck:  Supple, no adenopathy, tenderness or mass. Thyroid was normal. Lungs:  No respiratory distress.  Lungs were clear to auscultation, without wheezes, rales or rhonchi.  Breath sounds were clear and equal bilaterally. Heart:  Regular rhythm, without gallops, murmers or rubs. Abdomen:  Soft, flat, and non-tender to palpation.  No hepatosplenomagaly or mass. Skin:  Clear, warm, and dry, without rash or lesions.  Labs:   Results for orders placed during the hospital encounter of 10/30/11  PROTIME-INR      Component Value Range   Prothrombin Time 18.7 (*) 11.6 - 15.2 seconds   INR 1.53 (*) 0.00 - 1.49     Assessment:  The primary encounter diagnosis was Pulmonary embolus. Diagnoses of Hypertension and Hyperlipidemia were also pertinent to this visit.  Plan:   1.  The following meds were prescribed:   New Prescriptions   METOPROLOL (LOPRESSOR) 50 MG TABLET    Take 1 tablet (50 mg total) by mouth daily.   ROSUVASTATIN (CRESTOR) 20 MG TABLET    Take 1 tablet (20 mg total) by mouth daily.  TELMISARTAN-HYDROCHLOROTHIAZIDE (MICARDIS HCT) 80-25 MG PER TABLET    Take 1 tablet by mouth daily.   TRAZODONE (DESYREL) 50 MG TABLET    Take 1 tablet (50 mg total) by mouth at bedtime.   WARFARIN (COUMADIN) 7.5 MG TABLET    1 tablet Sun, Tue, Thurs, and 1/2 tablet all other days.   2.  The patient was instructed in symptomatic care and handouts were given. 3.  The patient was told to return if becoming worse in any way, if no better in 3 or 4 days, and given  some red flag symptoms that would indicate earlier return. She was told that she will need to find a primary care physician as soon as possible. I did provide her with enough medications to last for 3 months. She left before her results came back on her ProTime and the results were somewhat low at 1.53, so she will need to increase her dose. Therefore I will have her take a whole pill on Tuesday, Thursday, Saturday, and Sunday and a half a pill on other days and return in 2 weeks for a repeat INR.    Reuben Likes, MD 10/30/11 [phone]

## 2011-10-30 NOTE — ED Notes (Signed)
Pt  Seen  12  Days  Ago  For  Blood  Clot      -        Here  Today  For  Check  inr       Pt  Reports  She  Is  Taking  meds  As  Directed           She  Voices  No  Symptoms       -

## 2011-10-31 ENCOUNTER — Telehealth (HOSPITAL_COMMUNITY): Payer: Self-pay | Admitting: *Deleted

## 2011-10-31 NOTE — ED Notes (Signed)
I called [facility] and the secratary said because pt. has seen Dr. Sherene Sires before in [facility] that she can go to the Coumadin clinic.  She gave pt. appt. at the clinic on 8/23 @ 0930. I called pt. and left another message to call.  Call 2. Vassie Moselle 10/31/2011

## 2011-10-31 NOTE — ED Notes (Addendum)
Pt. called on VM for her lab results 8/20.  Discussed with Dr. Lorenz Coaster and he wants pt. to change her Coumadin schedule- to 1 tablet Tue., Thur., Sat., and Sun. and 1/2 tablet the other days. He wants her to return in 2 weeks for a repeat INR.  He also wants me to try and get her into the [place] [facility]. Her PCP was [facility].  I told him they only see [place] pt.'s there.  I told him I would call [facility] and try to get her in with them.   8/21 I called pt. and left a message to call.

## 2011-11-02 ENCOUNTER — Ambulatory Visit (INDEPENDENT_AMBULATORY_CARE_PROVIDER_SITE_OTHER): Payer: Self-pay | Admitting: *Deleted

## 2011-11-02 DIAGNOSIS — I2699 Other pulmonary embolism without acute cor pulmonale: Secondary | ICD-10-CM

## 2011-11-02 DIAGNOSIS — Z7901 Long term (current) use of anticoagulants: Secondary | ICD-10-CM

## 2011-11-02 DIAGNOSIS — Z9229 Personal history of other drug therapy: Secondary | ICD-10-CM | POA: Insufficient documentation

## 2011-11-02 LAB — POCT INR: INR: 1.9

## 2011-11-02 NOTE — Patient Instructions (Addendum)
A full discussion of the nature of anticoagulants has been carried out.  A benefit risk analysis has been presented to the patient, so that they understand the justification for choosing anticoagulation at this time. The need for frequent and regular monitoring, precise dosage adjustment and compliance is stressed.  Side effects of potential bleeding are discussed.  The patient should avoid any OTC items containing aspirin or ibuprofen, and should avoid great swings in general diet.  Avoid alcohol consumption.  Call if any signs of abnormal bleeding. Clinic phone number [phone].  

## 2011-11-09 ENCOUNTER — Ambulatory Visit (INDEPENDENT_AMBULATORY_CARE_PROVIDER_SITE_OTHER): Payer: Self-pay

## 2011-11-09 DIAGNOSIS — Z7901 Long term (current) use of anticoagulants: Secondary | ICD-10-CM

## 2011-11-09 DIAGNOSIS — I2699 Other pulmonary embolism without acute cor pulmonale: Secondary | ICD-10-CM

## 2011-11-19 ENCOUNTER — Ambulatory Visit (INDEPENDENT_AMBULATORY_CARE_PROVIDER_SITE_OTHER): Payer: Self-pay | Admitting: Pharmacist

## 2011-11-19 DIAGNOSIS — I2699 Other pulmonary embolism without acute cor pulmonale: Secondary | ICD-10-CM

## 2011-11-19 DIAGNOSIS — Z7901 Long term (current) use of anticoagulants: Secondary | ICD-10-CM

## 2011-11-19 NOTE — Patient Instructions (Signed)
Patient instructed to take medications as defined in the Anti-coagulation Track section of this encounter.  Patient instructed to OMIT today's dose.  Patient verbalized understanding of these instructions.    

## 2011-11-19 NOTE — Progress Notes (Signed)
Anti-Coagulation Progress Note  Victoria Campos is a 61 y.o. female who is currently on an anti-coagulation regimen.    RECENT RESULTS: Recent results are below, the most recent result is correlated with a dose of 37.5 mg. per week: Lab Results  Component Value Date   INR 3.90 11/19/2011   INR 3.3 11/09/2011   INR 1.9 11/02/2011    ANTI-COAG DOSE:   Latest dosing instructions   Total Sun Mon Tue Wed Thu Fri Sat   33.75 3.75 mg 7.5 mg 3.75 mg 3.75 mg 3.75 mg 7.5 mg 3.75 mg    (7.5 mg0.5) (7.5 mg1) (7.5 mg0.5) (7.5 mg0.5) (7.5 mg0.5) (7.5 mg1) (7.5 mg0.5)         ANTICOAG SUMMARY: Anticoagulation Episode Summary              Current INR goal 2.0-3.0 Next INR check 11/26/2011   INR from last check 3.90! (11/19/2011)     Weekly max dose (mg)  Target end date    Indications Encounter for long-term (current) use of anticoagulants   INR check location [facility]erred [facility]    Send INR reminders to ANTICOAG IMP   Comments Bilateral PE       Provider Role Specialty Phone number   Nyoka Cowden, MD  Pulmonary Disease [phone]        ANTICOAG TODAY: Anticoagulation Summary as of 11/19/2011              INR goal 2.0-3.0     Selected INR 3.90! (11/19/2011) Next INR check 11/26/2011   Weekly max dose (mg)  Target end date    Indications Encounter for long-term (current) use of anticoagulants    Anticoagulation Episode Summary              INR check location [facility] Preferred lab    Send INR reminders to ANTICOAG IMP   Comments Bilateral PE       Provider Role Specialty Phone number   Nyoka Cowden, MD  Pulmonary Disease [phone]        PATIENT INSTRUCTIONS: Patient Instructions  Patient instructed to take medications as defined in the Anti-coagulation Track section of this encounter.  Patient instructed to OMIT today's dose.  Patient verbalized understanding of these instructions.        FOLLOW-UP Return in 7 days (on 11/26/2011) for Follow up  INR at 0945h.  Hulen Luster, III Pharm.D., CACP

## 2011-11-26 ENCOUNTER — Ambulatory Visit (INDEPENDENT_AMBULATORY_CARE_PROVIDER_SITE_OTHER): Payer: Self-pay | Admitting: Pharmacist

## 2011-11-26 ENCOUNTER — Ambulatory Visit (INDEPENDENT_AMBULATORY_CARE_PROVIDER_SITE_OTHER): Payer: Self-pay | Admitting: Internal Medicine

## 2011-11-26 ENCOUNTER — Encounter: Payer: Self-pay | Admitting: Internal Medicine

## 2011-11-26 VITALS — BP 116/75 | HR 64 | Temp 98.2°F | Ht 64.5 in | Wt 179.1 lb

## 2011-11-26 DIAGNOSIS — H6120 Impacted cerumen, unspecified ear: Secondary | ICD-10-CM

## 2011-11-26 DIAGNOSIS — I2699 Other pulmonary embolism without acute cor pulmonale: Secondary | ICD-10-CM

## 2011-11-26 DIAGNOSIS — H8110 Benign paroxysmal vertigo, unspecified ear: Secondary | ICD-10-CM

## 2011-11-26 DIAGNOSIS — Z7901 Long term (current) use of anticoagulants: Secondary | ICD-10-CM

## 2011-11-26 DIAGNOSIS — J309 Allergic rhinitis, unspecified: Secondary | ICD-10-CM

## 2011-11-26 DIAGNOSIS — F411 Generalized anxiety disorder: Secondary | ICD-10-CM

## 2011-11-26 DIAGNOSIS — G43909 Migraine, unspecified, not intractable, without status migrainosus: Secondary | ICD-10-CM

## 2011-11-26 DIAGNOSIS — Z0000 Encounter for general adult medical examination without abnormal findings: Secondary | ICD-10-CM

## 2011-11-26 DIAGNOSIS — J301 Allergic rhinitis due to pollen: Secondary | ICD-10-CM

## 2011-11-26 DIAGNOSIS — E785 Hyperlipidemia, unspecified: Secondary | ICD-10-CM

## 2011-11-26 DIAGNOSIS — I10 Essential (primary) hypertension: Secondary | ICD-10-CM

## 2011-11-26 DIAGNOSIS — G479 Sleep disorder, unspecified: Secondary | ICD-10-CM

## 2011-11-26 DIAGNOSIS — H6123 Impacted cerumen, bilateral: Secondary | ICD-10-CM

## 2011-11-26 MED ORDER — MOMETASONE FUROATE 50 MCG/ACT NA SUSP: 2.0000 | Freq: Every day | NASAL | Status: DC

## 2011-11-26 MED ORDER — TELMISARTAN-HCTZ 80-25 MG PO TABS: 1.0000 | ORAL_TABLET | Freq: Every day | ORAL | Status: DC

## 2011-11-26 NOTE — Progress Notes (Signed)
 This encounter was created in error - please disregard.

## 2011-11-26 NOTE — Progress Notes (Signed)
Anti-Coagulation Progress Note  Victoria Campos is a 61 y.o. female who is currently on an anti-coagulation regimen.    RECENT RESULTS: Recent results are below, the most recent result is correlated with a dose of 33.75 mg. per week: Lab Results  Component Value Date   INR 2.10 11/26/2011   INR 3.90 11/19/2011   INR 3.3 11/09/2011    ANTI-COAG DOSE:   Latest dosing instructions   Total Sun Mon Tue Wed Thu Fri Sat   41.25 3.75 mg 7.5 mg 7.5 mg 7.5 mg 3.75 mg 7.5 mg 3.75 mg    (7.5 mg0.5) (7.5 mg1) (7.5 mg1) (7.5 mg1) (7.5 mg0.5) (7.5 mg1) (7.5 mg0.5)         ANTICOAG SUMMARY: Anticoagulation Episode Summary              Current INR goal 2.0-3.0 Next INR check 12/10/2011   INR from last check 2.10 (11/26/2011)     Weekly max dose (mg)  Target end date    Indications Encounter for long-term (current) use of anticoagulants   INR check location [facility] lab    Send INR reminders to ANTICOAG IMP   Comments Bilateral PE       Provider Role Specialty Phone number   Nyoka Cowden, MD  Pulmonary Disease [phone]        ANTICOAG TODAY: Anticoagulation Summary as of 11/26/2011              INR goal 2.0-3.0     Selected INR 2.10 (11/26/2011) Next INR check 12/10/2011   Weekly max dose (mg)  Target end date    Indications Encounter for long-term (current) use of anticoagulants    Anticoagulation Episode Summary              INR check location [facility] Preferred lab    Send INR reminders to ANTICOAG IMP   Comments Bilateral PE       Provider Role Specialty Phone number   Nyoka Cowden, MD  Pulmonary Disease [phone]        PATIENT INSTRUCTIONS: Patient Instructions  Patient instructed to take medications as defined in the Anti-coagulation Track section of this encounter.  Patient instructed to take today's dose.  Patient verbalized understanding of these instructions.        FOLLOW-UP Return in 2 weeks (on 12/10/2011) for Follow up  INR at 1000h.  Hulen Luster, III Pharm.D., CACP

## 2011-11-26 NOTE — Assessment & Plan Note (Addendum)
Last evaluated by Dr. Sherene Sires today Sept 16, 2013. He would like her to have a repeat chest x-ray 12/14/2011 which has been scheduled and she will follow up with him at that time.  With unprovoked PE will need 3-6 months Coumadin therapy.  Per the discharge summary, she was to follow-up with Dr. Cyndie Chime for further hematology evaluation.

## 2011-11-26 NOTE — Patient Instructions (Signed)
Patient instructed to take medications as defined in the Anti-coagulation Track section of this encounter.  Patient instructed to take today's dose.  Patient verbalized understanding of these instructions.    

## 2011-11-26 NOTE — Progress Notes (Signed)
  Subjective:    Patient ID: Victoria Campos, female    DOB: 06-14-1950, 61 y.o.   MRN: [id number]  HPI Patient is a 61 year old female former patient of [facility] who presents today to establish care and get refill of her bp medicine. Her hx is significant for recent pulmonary embolism July 2013. She is subsequently on Coumadin followed by Dr. Sherene Sires of [facility] with INR checks followed by Dr.Groce of [facility]. She has hypertension, hyperlipidemia, anxiety, migraines, and benign paroxysmal positional vertigo. She reports compliance with all her medications but she needs a refill of micardis HCTZ (olmesartan-hydrochlorothiazide) 80-25 mg. No complaints other than back soreness and requesting earwax to be removed.    Review of Systems  Constitutional: Negative for fever and fatigue.  HENT: Negative for congestion.   Eyes: Negative for visual disturbance.  Respiratory: Negative for chest tightness and shortness of breath.   Cardiovascular: Positive for chest pain.  Musculoskeletal: Positive for back pain.  Neurological: Negative for weakness.  Psychiatric/Behavioral: Negative for dysphoric mood.       Objective:   Physical Exam  Constitutional: She is oriented to person, place, and time. She appears well-developed and well-nourished. No distress.  HENT:  Head: Normocephalic and atraumatic.  Right Ear: External ear normal.  Left Ear: External ear normal.       Bilateral cerumen impaction  Neck: Normal range of motion. Neck supple.  Cardiovascular: Normal rate, regular rhythm, normal heart sounds and intact distal pulses.   Pulmonary/Chest: Effort normal and breath sounds normal.  Abdominal: Soft. Bowel sounds are normal.  Musculoskeletal: Normal range of motion. She exhibits no edema and no tenderness.  Neurological: She is alert and oriented to person, place, and time. She has normal reflexes.  Skin: Skin is warm and dry.  Psychiatric: She has a normal mood and affect.           Assessment & Plan:  See detail problem list

## 2011-11-26 NOTE — Patient Instructions (Signed)
It was nice to meet you today, Ms. Victoria Campos. As we discussed, I have refilled your Micardis prescription.  Continue to follow-up with Dr. Alexandria Lodge for your INR checks and Dr. Sherene Sires of Pulmonology as scheduled. I would like to see you back in 6 months.

## 2011-11-29 NOTE — Assessment & Plan Note (Addendum)
TMs obscured bilaterally.  Pt reports that she usually gets them "cleaned" every  3 months.  Bilateral EOM evacuated today.

## 2011-11-29 NOTE — Assessment & Plan Note (Addendum)
Continue current regimen of metoprolol 50 mg qd, telmisartan-HCTZ 80-25 mg qd as pt bp at goal today.  Given refill of telmisartan-HCTZ today. Previously followed by Dr. Clayburn Pert (Cardiology)

## 2011-11-29 NOTE — Assessment & Plan Note (Signed)
INR therapeutic level.  Will continue INR monitoring per Dr. Alexandria Lodge.  Likely 3-6 months for PE.

## 2011-11-29 NOTE — Assessment & Plan Note (Signed)
Cont Crestor, assess Lipid Panel at next visit.

## 2011-11-30 NOTE — Assessment & Plan Note (Signed)
Stable, refilled Nasonex today.

## 2011-11-30 NOTE — Assessment & Plan Note (Signed)
Controlled with Naproxen, last migraine a month ago.

## 2011-12-10 ENCOUNTER — Encounter: Payer: Self-pay | Admitting: Obstetrics & Gynecology

## 2011-12-10 ENCOUNTER — Ambulatory Visit (INDEPENDENT_AMBULATORY_CARE_PROVIDER_SITE_OTHER): Payer: Self-pay | Admitting: Pharmacist

## 2011-12-10 DIAGNOSIS — I2699 Other pulmonary embolism without acute cor pulmonale: Secondary | ICD-10-CM

## 2011-12-10 DIAGNOSIS — Z7901 Long term (current) use of anticoagulants: Secondary | ICD-10-CM

## 2011-12-10 LAB — POCT INR: INR: 1.9

## 2011-12-10 NOTE — Progress Notes (Signed)
Anti-Coagulation Progress Note  Victoria Campos is a 61 y.o. female who is currently on an anti-coagulation regimen.    RECENT RESULTS: Recent results are below, the most recent result is correlated with a dose of 41.25 mg. per week: Lab Results  Component Value Date   INR 1.90 12/10/2011   INR 2.10 11/26/2011   INR 3.90 11/19/2011    ANTI-COAG DOSE:   Latest dosing instructions   Total Sun Mon Tue Wed Thu Fri Sat   48.75 3.75 mg 7.5 mg 7.5 mg 7.5 mg 7.5 mg 7.5 mg 7.5 mg    (7.5 mg0.5) (7.5 mg1) (7.5 mg1) (7.5 mg1) (7.5 mg1) (7.5 mg1) (7.5 mg1)         ANTICOAG SUMMARY: Anticoagulation Episode Summary              Current INR goal 2.0-3.0 Next INR check 12/24/2011   INR from last check 1.90! (12/10/2011)     Weekly max dose (mg)  Target end date    Indications Encounter for long-term (current) use of anticoagulants   INR check location [facility] Preferred lab    Send INR reminders to ANTICOAG IMP   Comments Bilateral PE       Provider Role Specialty Phone number   Nyoka Cowden, MD  Pulmonary Disease [phone]        ANTICOAG TODAY: Anticoagulation Summary as of 12/10/2011              INR goal 2.0-3.0     Selected INR 1.90! (12/10/2011) Next INR check 12/24/2011   Weekly max dose (mg)  Target end date    Indications Encounter for long-term (current) use of anticoagulants    Anticoagulation Episode Summary              INR check location [facility] Preferred lab    Send INR reminders to ANTICOAG IMP   Comments Bilateral PE       Provider Role Specialty Phone number   Nyoka Cowden, MD  Pulmonary Disease [phone]        PATIENT INSTRUCTIONS: Patient Instructions  Patient instructed to take medications as defined in the Anti-coagulation Track section of this encounter.  Patient instructed to take today's dose.  Patient verbalized understanding of these instructions.        FOLLOW-UP Return in 2 weeks (on 12/24/2011) for Follow up  INR at 1015h.  Hulen Luster, III Pharm.D., CACP

## 2011-12-10 NOTE — Patient Instructions (Signed)
Patient instructed to take medications as defined in the Anti-coagulation Track section of this encounter.  Patient instructed to take today's dose.  Patient verbalized understanding of these instructions.    

## 2011-12-14 ENCOUNTER — Ambulatory Visit: Payer: Self-pay | Admitting: Internal Medicine

## 2011-12-17 ENCOUNTER — Ambulatory Visit: Payer: Self-pay | Admitting: Internal Medicine

## 2011-12-24 ENCOUNTER — Ambulatory Visit (INDEPENDENT_AMBULATORY_CARE_PROVIDER_SITE_OTHER): Payer: Self-pay | Admitting: Pharmacist

## 2011-12-24 DIAGNOSIS — Z7901 Long term (current) use of anticoagulants: Secondary | ICD-10-CM

## 2011-12-24 DIAGNOSIS — I2699 Other pulmonary embolism without acute cor pulmonale: Secondary | ICD-10-CM

## 2011-12-24 NOTE — Progress Notes (Signed)
Agree with Dr. Groce's Assessment/plan for this patient.  

## 2011-12-24 NOTE — Progress Notes (Signed)
Anti-Coagulation Progress Note  Victoria Campos is a 61 y.o. female who is currently on an anti-coagulation regimen.    RECENT RESULTS: Recent results are below, the most recent result is correlated with a dose of 7.5mg . per week until INR repeat.: Lab Results  Component Value Date   INR 6.20 12/24/2011   INR 1.90 12/10/2011   INR 2.10 11/26/2011    ANTI-COAG DOSE:   Latest dosing instructions   Total Sun Mon Tue Wed Thu Fri Sat   7.5  7.5 mg          (7.5 mg1)              ANTICOAG SUMMARY: Anticoagulation Episode Summary              Current INR goal 2.0-3.0 Next INR check 12/26/2011   INR from last check 6.20! (12/24/2011)     Weekly max dose (mg)  Target end date    Indications Long term (current) use of anticoagulants   INR check location [facility] Preferred lab    Send INR reminders to ANTICOAG IMP   Comments Bilateral PE       Provider Role Specialty Phone number   Nyoka Cowden, MD  Pulmonary Disease [phone]        ANTICOAG TODAY: Anticoagulation Summary as of 12/24/2011              INR goal 2.0-3.0     Selected INR 6.20! (12/24/2011) Next INR check 12/26/2011   Weekly max dose (mg)  Target end date    Indications Long term (current) use of anticoagulants    Anticoagulation Episode Summary              INR check location [facility] Preferred lab    Send INR reminders to ANTICOAG IMP   Comments Bilateral PE       Provider Role Specialty Phone number   Nyoka Cowden, MD  Pulmonary Disease [phone]        PATIENT INSTRUCTIONS: Patient Instructions  Patient instructed to take medications as defined in the Anti-coagulation Track section of this encounter.  Patient instructed to OMIT tomorrows dose (she has already taken today's dose. ) Patient verbalized understanding of these instructions.        FOLLOW-UP Return in 2 days (on 12/26/2011) for Follow up INR at 1-15h.  Hulen Luster, III Pharm.D., CACP

## 2011-12-24 NOTE — Patient Instructions (Signed)
Patient instructed to take medications as defined in the Anti-coagulation Track section of this encounter.  Patient instructed to OMIT tomorrows dose (she has already taken today's dose. ) Patient verbalized understanding of these instructions.

## 2011-12-26 ENCOUNTER — Ambulatory Visit (INDEPENDENT_AMBULATORY_CARE_PROVIDER_SITE_OTHER): Payer: Self-pay | Admitting: Pharmacist

## 2011-12-26 DIAGNOSIS — Z7901 Long term (current) use of anticoagulants: Secondary | ICD-10-CM

## 2011-12-26 DIAGNOSIS — I2699 Other pulmonary embolism without acute cor pulmonale: Secondary | ICD-10-CM

## 2011-12-26 NOTE — Patient Instructions (Signed)
Patient instructed to take medications as defined in the Anti-coagulation Track section of this encounter.  Patient instructed to take today's dose.  Patient verbalized understanding of these instructions.    

## 2011-12-26 NOTE — Progress Notes (Signed)
Anti-Coagulation Progress Note  Victoria Campos is a 61 y.o. female who is currently on an anti-coagulation regimen.    RECENT RESULTS: Recent results are below, the most recent result is correlated with a dose of having held her warfarin x 1 day after having previously taken 48.75 mg. per week: Lab Results  Component Value Date   INR 3.2 12/26/2011   INR 6.20 12/24/2011   INR 1.90 12/10/2011    ANTI-COAG DOSE:   Latest dosing instructions   Total Sun Mon Tue Wed Thu Fri Sat   45 7.5 mg 7.5 mg 7.5 mg 3.75 mg 7.5 mg 7.5 mg 3.75 mg    (7.5 mg1) (7.5 mg1) (7.5 mg1) (7.5 mg0.5) (7.5 mg1) (7.5 mg1) (7.5 mg0.5)         ANTICOAG SUMMARY: Anticoagulation Episode Summary              Current INR goal 2.0-3.0 Next INR check 01/02/2012   INR from last check 3.2! (12/26/2011)     Weekly max dose (mg)  Target end date    Indications Long term (current) use of anticoagulants   INR check location [facility] Preferred lab    Send INR reminders to ANTICOAG IMP   Comments Bilateral PE       Provider Role Specialty Phone number   Nyoka Cowden, MD  Pulmonary Disease [phone]        ANTICOAG TODAY: Anticoagulation Summary as of 12/26/2011              INR goal 2.0-3.0     Selected INR 3.2! (12/26/2011) Next INR check 01/02/2012   Weekly max dose (mg)  Target end date    Indications Long term (current) use of anticoagulants    Anticoagulation Episode Summary              INR check location [facility] Preferred lab    Send INR reminders to ANTICOAG IMP   Comments Bilateral PE       Provider Role Specialty Phone number   Nyoka Cowden, MD  Pulmonary Disease [phone]        PATIENT INSTRUCTIONS: Patient Instructions  Patient instructed to take medications as defined in the Anti-coagulation Track section of this encounter.  Patient instructed to take today's dose.  Patient verbalized understanding of these instructions.        FOLLOW-UP Return in 7  days (on 01/02/2012) for Follow up INR at 1000h.  Hulen Luster, III Pharm.D., CACP

## 2012-01-01 ENCOUNTER — Encounter: Payer: Self-pay | Admitting: Internal Medicine

## 2012-01-01 NOTE — Progress Notes (Signed)
 This encounter was created in error - please disregard.

## 2012-01-02 ENCOUNTER — Encounter: Payer: Self-pay | Admitting: Obstetrics & Gynecology

## 2012-01-02 ENCOUNTER — Other Ambulatory Visit: Payer: Self-pay | Admitting: *Deleted

## 2012-01-02 ENCOUNTER — Ambulatory Visit (INDEPENDENT_AMBULATORY_CARE_PROVIDER_SITE_OTHER): Payer: Self-pay

## 2012-01-02 DIAGNOSIS — I2699 Other pulmonary embolism without acute cor pulmonale: Secondary | ICD-10-CM

## 2012-01-02 DIAGNOSIS — Z86711 Personal history of pulmonary embolism: Secondary | ICD-10-CM

## 2012-01-02 DIAGNOSIS — Z7901 Long term (current) use of anticoagulants: Secondary | ICD-10-CM

## 2012-01-02 LAB — POCT INR: INR: 2

## 2012-01-02 MED ORDER — WARFARIN SODIUM 7.5 MG PO TABS: ORAL_TABLET | ORAL | Status: DC

## 2012-01-02 NOTE — Telephone Encounter (Signed)
Last INR today 2.0

## 2012-01-04 NOTE — Progress Notes (Signed)
Agree with plan as documented in Dr. Groce's Note.  

## 2012-01-09 ENCOUNTER — Telehealth: Payer: Self-pay | Admitting: *Deleted

## 2012-01-09 NOTE — Telephone Encounter (Signed)
Needs note for work that she can resume full activity. Dr Bosie Clos aware - will mail letter to pt. Stanton Kidney Jayson Waterhouse RN 01/09/12 11:45AM

## 2012-01-11 ENCOUNTER — Telehealth: Payer: Self-pay | Admitting: *Deleted

## 2012-01-11 NOTE — Telephone Encounter (Signed)
Returned call to pt 4:50PM - left message on home phone ID recording. Stanton Kidney Imanie Darrow RN 01/11/12 4:50PM

## 2012-01-14 ENCOUNTER — Ambulatory Visit (INDEPENDENT_AMBULATORY_CARE_PROVIDER_SITE_OTHER): Payer: Self-pay | Admitting: Pharmacist

## 2012-01-14 DIAGNOSIS — I2699 Other pulmonary embolism without acute cor pulmonale: Secondary | ICD-10-CM

## 2012-01-14 DIAGNOSIS — Z7901 Long term (current) use of anticoagulants: Secondary | ICD-10-CM

## 2012-01-14 LAB — POCT INR: INR: 2.8

## 2012-01-14 NOTE — Patient Instructions (Signed)
Patient instructed to take medications as defined in the Anti-coagulation Track section of this encounter.  Patient instructed to tke today's dose.  Patient verbalized understanding of these instructions.    

## 2012-01-14 NOTE — Progress Notes (Signed)
Anti-Coagulation Progress Note  Victoria Campos is a 61 y.o. female who is currently on an anti-coagulation regimen.    RECENT RESULTS: Recent results are below, the most recent result is correlated with a dose of 52.5 mg. per week: Lab Results  Component Value Date   INR 2.80 01/14/2012   INR 2.0 01/02/2012   INR 3.2 12/26/2011    ANTI-COAG DOSE:   Latest dosing instructions   Total Sun Mon Tue Wed Thu Fri Sat   52.5 7.5 mg 7.5 mg 7.5 mg 7.5 mg 7.5 mg 7.5 mg 7.5 mg    (7.5 mg1) (7.5 mg1) (7.5 mg1) (7.5 mg1) (7.5 mg1) (7.5 mg1) (7.5 mg1)         ANTICOAG SUMMARY: Anticoagulation Episode Summary              Current INR goal 2.0-3.0 Next INR check 02/11/2012   INR from last check 2.80 (01/14/2012)     Weekly max dose (mg)  Target end date    Indications Long term (current) use of anticoagulants [V58.61]   INR check location [facility] Preferred lab    Send INR reminders to ANTICOAG IMP   Comments Bilateral PE       Provider Role Specialty Phone number   Nyoka Cowden, MD  Pulmonary Disease [phone]        ANTICOAG TODAY: Anticoagulation Summary as of 01/14/2012              INR goal 2.0-3.0     Selected INR 2.80 (01/14/2012) Next INR check 02/11/2012   Weekly max dose (mg)  Target end date    Indications Long term (current) use of anticoagulants [V58.61]    Anticoagulation Episode Summary              INR check location [facility] Preferred lab    Send INR reminders to ANTICOAG IMP   Comments Bilateral PE       Provider Role Specialty Phone number   Nyoka Cowden, MD  Pulmonary Disease [phone]        PATIENT INSTRUCTIONS: Patient Instructions  Patient instructed to take medications as defined in the Anti-coagulation Track section of this encounter.  Patient instructed to tke today's dose.  Patient verbalized understanding of these instructions.        FOLLOW-UP Return in 4 weeks (on 02/11/2012) for Follow up INR at 1100h.  Hulen Luster, III Pharm.D., CACP

## 2012-01-15 ENCOUNTER — Ambulatory Visit (INDEPENDENT_AMBULATORY_CARE_PROVIDER_SITE_OTHER): Payer: Self-pay | Admitting: Internal Medicine

## 2012-01-15 ENCOUNTER — Ambulatory Visit (INDEPENDENT_AMBULATORY_CARE_PROVIDER_SITE_OTHER)
Admission: RE | Admit: 2012-01-15 | Discharge: 2012-01-15 | Disposition: A | Discharge status: Home / Self Care | Payer: Self-pay | Source: Ambulatory Visit | Attending: Internal Medicine | Admitting: Internal Medicine

## 2012-01-15 ENCOUNTER — Encounter: Payer: Self-pay | Admitting: Internal Medicine

## 2012-01-15 VITALS — BP 132/80 | HR 57 | Temp 98.2°F | Ht 64.5 in | Wt 186.2 lb

## 2012-01-15 DIAGNOSIS — I2699 Other pulmonary embolism without acute cor pulmonale: Secondary | ICD-10-CM

## 2012-01-15 NOTE — Progress Notes (Signed)
  Subjective:    Patient ID: Victoria Campos, female    DOB: Apr 24, 1950  MRN: [id number]  HPI  25 yobf never regular smoker acutely ill 5 days before going ER with dx of pe referred by [facility]   10/18/2011 1st pulmonary cc short of breath onset 5 days before admit:  Admission date: 09/23/2011  Discharge Date: 09/27/2011  Primary MD Lehman Prom, NP  Admitting Physician [name], MD  Admission Diagnosis Pulmonary emboli [415.19]  flank pain  Discharge Diagnosis  Principal Problem:  *Pulmonary embolism - Pos Lupus anticoaglulant - Neg venous dopplers and Echo s RH strain  Active Problems:  HYPERLIPIDEMIA  ANXIETY  DEPRESSION  MIGRAINE HEADACHE  HYPERTENSION, BENIGN ESSENTIAL  Nausea  Fever  Feeling 100% better @ 10/18/11 ov but ? cxr abn therefore requested eval by Dr Clelia Croft.  No longer cp, cough, sob. No leg swelling rec You need a follow up office visit with  cxr in one month unless you start having symptoms shortness, cough, pain when breathing.   11/26/2011 f/u ov/Wert cc missed appt  01/01/2012 f/u ov/Wert cc missed appt  01/15/2012 f/u ov/Wert cc min dry cough, typical for her, no cp or sob no leg swelling  Sleeping ok without nocturnal  or early am exacerbation  of respiratory  c/o's or need for noct saba. Also denies any obvious fluctuation of symptoms with weather or environmental changes or other aggravating or alleviating factors except as outlined above   ROS  The following are not active complaints unless bolded sore throat, dysphagia, dental problems, itching, sneezing,  nasal congestion or excess/ purulent secretions, ear ache,   fever, chills, sweats, unintended wt loss, pleuritic or exertional cp, hemoptysis,  orthopnea pnd or leg swelling, presyncope, palpitations, heartburn, abdominal pain, anorexia, nausea, vomiting, diarrhea  or change in bowel or urinary habits, change in stools or urine, dysuria,hematuria,  rash, arthralgias, visual complaints,  headache, numbness weakness or ataxia or problems with walking or coordination,  change in mood/affect or memory.                  Objective:   Physical Exam Wt 01/15/2012  186 Wt Readings from Last 3 Encounters:  10/18/11 176 lb 9.6 oz (80.105 kg)  09/23/11 180 lb 14.4 oz (82.056 kg)  09/07/11 182 lb (82.555 kg)    amb bf nad  HEENT: nl dentition, turbinates, and orophanx. Nl external ear canals without cough reflex   NECK :  without JVD/Nodes/TM/ nl carotid upstrokes bilaterally   LUNGS: no acc muscle use, clear to A and P bilaterally without cough on insp or exp maneuvers   CV:  RRR  no s3 or murmur or increase in P2, no edema   ABD:  soft and nontender with nl excursion in the supine position. No bruits or organomegaly, bowel sounds nl  MS:  warm without deformities, calf tenderness, cyanosis or clubbing     CXR  01/15/2012 :  Left basilar subsegmental atelectasis.  Improved airspace disease versus infarct at the right costophrenic angle.      Assessment & Plan:

## 2012-01-15 NOTE — Assessment & Plan Note (Signed)
Acute segmental bilateral segmental PE, Dx 09/23/11 with Pos lupus anticoagulant, neg venous dopplers, echo s R Ht strain  cxr is c/w resolving infarcts, no further pulmonary f/u needed  She is at risk of recurrent pe since this was apparently unprovoked and she has a lupus anticoagulant moderate obesity and smoker  rec f/u by Dr Cyndie Chime before considering d/c coumadin though note echo and venous dopplers being normal is somewhat reassuring that the risk of recurrent life threatening PE is relatively low but definitely a future concern.

## 2012-01-15 NOTE — Patient Instructions (Addendum)
No further pulmonary follow up is needed.  Your doctors and Shaune Leeks can refer you to Dr Cyndie Chime before considering stopping your blood thinner because you may have a tendency to blood clots the rest of your life and require some form of preventive therapy.

## 2012-01-17 ENCOUNTER — Encounter: Payer: Self-pay | Admitting: Obstetrics & Gynecology

## 2012-01-22 NOTE — Addendum Note (Signed)
Addended by: Neomia Dear on: 01/22/2012 09:15 AM   Modules accepted: Orders

## 2012-01-29 ENCOUNTER — Other Ambulatory Visit: Payer: Self-pay | Admitting: *Deleted

## 2012-01-29 MED ORDER — METOPROLOL TARTRATE 50 MG PO TABS: 50.0000 mg | ORAL_TABLET | Freq: Every day | ORAL | Status: DC

## 2012-01-29 MED ORDER — TELMISARTAN-HCTZ 80-25 MG PO TABS: 1.0000 | ORAL_TABLET | Freq: Every day | ORAL | Status: DC

## 2012-01-29 NOTE — Telephone Encounter (Signed)
Pt calls and states since Sunday her L calf has been numb, tingling. This is comes and goes. Denies injuries. Nothing helps or makes worse. She desires to be seen thurs pm. Scheduled w/ med student at 1330. Dr Criselda Peaches

## 2012-01-31 ENCOUNTER — Ambulatory Visit: Payer: Self-pay

## 2012-02-05 ENCOUNTER — Ambulatory Visit: Payer: Self-pay | Admitting: Internal Medicine

## 2012-02-11 ENCOUNTER — Ambulatory Visit (INDEPENDENT_AMBULATORY_CARE_PROVIDER_SITE_OTHER): Payer: Self-pay | Admitting: Pharmacist

## 2012-02-11 DIAGNOSIS — Z7901 Long term (current) use of anticoagulants: Secondary | ICD-10-CM

## 2012-02-11 DIAGNOSIS — I2699 Other pulmonary embolism without acute cor pulmonale: Secondary | ICD-10-CM

## 2012-02-11 LAB — POCT INR: INR: 2.2

## 2012-02-11 NOTE — Patient Instructions (Signed)
Patient instructed to take medications as defined in the Anti-coagulation Track section of this encounter.  Patient instructed to take today's dose.  Patient verbalized understanding of these instructions.    

## 2012-02-11 NOTE — Progress Notes (Signed)
Agree with Dr. Groce's Plan. 

## 2012-02-11 NOTE — Progress Notes (Signed)
Anti-Coagulation Progress Note  ROYALTY FAKHOURI is a 61 y.o. female who is currently on an anti-coagulation regimen.    RECENT RESULTS: Recent results are below, the most recent result is correlated with a dose of 52.5 mg. per week: Lab Results  Component Value Date   INR 2.20 02/11/2012   INR 2.80 01/14/2012   INR 2.0 01/02/2012    ANTI-COAG DOSE:   Latest dosing instructions   Total Sun Mon Tue Wed Thu Fri Sat   52.5 7.5 mg 7.5 mg 7.5 mg 7.5 mg 7.5 mg 7.5 mg 7.5 mg    (7.5 mg1) (7.5 mg1) (7.5 mg1) (7.5 mg1) (7.5 mg1) (7.5 mg1) (7.5 mg1)         ANTICOAG SUMMARY: Anticoagulation Episode Summary              Current INR goal 2.0-3.0 Next INR check 03/10/2012   INR from last check 2.20 (02/11/2012)     Weekly max dose (mg)  Target end date    Indications Long term (current) use of anticoagulants [V58.61]   INR check location [facility]    Send INR reminders to ANTICOAG IMP   Comments Bilateral PE       Provider Role Specialty Phone number   Nyoka Cowden, MD  Pulmonary Disease [phone]        ANTICOAG TODAY: Anticoagulation Summary as of 02/11/2012              INR goal 2.0-3.0     Selected INR 2.20 (02/11/2012) Next INR check 03/10/2012   Weekly max dose (mg)  Target end date    Indications Long term (current) use of anticoagulants [V58.61]    Anticoagulation Episode Summary              INR check location [facility] Preferred lab    Send INR reminders to ANTICOAG IMP   Comments Bilateral PE       Provider Role Specialty Phone number   Nyoka Cowden, MD  Pulmonary Disease [phone]        PATIENT INSTRUCTIONS: Patient Instructions  Patient instructed to take medications as defined in the Anti-coagulation Track section of this encounter.  Patient instructed to take today's dose.  Patient verbalized understanding of these instructions.        FOLLOW-UP Return in 4 weeks (on 03/10/2012) for Follow up INR at  1045h.  Hulen Luster, III Pharm.D., CACP

## 2012-03-10 ENCOUNTER — Ambulatory Visit: Payer: PRIVATE HEALTH INSURANCE

## 2012-03-17 ENCOUNTER — Ambulatory Visit: Payer: PRIVATE HEALTH INSURANCE

## 2012-03-24 ENCOUNTER — Ambulatory Visit: Payer: PRIVATE HEALTH INSURANCE

## 2012-04-14 ENCOUNTER — Ambulatory Visit (INDEPENDENT_AMBULATORY_CARE_PROVIDER_SITE_OTHER): Payer: PRIVATE HEALTH INSURANCE | Admitting: Pharmacist

## 2012-04-14 DIAGNOSIS — I2699 Other pulmonary embolism without acute cor pulmonale: Secondary | ICD-10-CM

## 2012-04-14 DIAGNOSIS — Z7901 Long term (current) use of anticoagulants: Secondary | ICD-10-CM

## 2012-04-14 NOTE — Progress Notes (Signed)
Anti-Coagulation Progress Note  Victoria Campos is a 62 y.o. female who is currently on an anti-coagulation regimen.    RECENT RESULTS: Recent results are below, the most recent result is correlated with a dose of 52.5 mg. per week: Lab Results  Component Value Date   INR 1.80 04/14/2012   INR 2.20 02/11/2012   INR 2.80 01/14/2012    ANTI-COAG DOSE:   Latest dosing instructions   Total Sun Mon Tue Wed Thu Fri Sat   63.75 7.5 mg 11.25 mg 7.5 mg 11.25 mg 7.5 mg 11.25 mg 7.5 mg    (7.5 mg1) (7.5 mg1.5) (7.5 mg1) (7.5 mg1.5) (7.5 mg1) (7.5 mg1.5) (7.5 mg1)         ANTICOAG SUMMARY: Anticoagulation Episode Summary              Current INR goal 2.0-3.0 Next INR check 04/28/2012   INR from last check 1.80! (04/14/2012)     Weekly max dose (mg)  Target end date    Indications Long term (current) use of anticoagulants [V58.61]   INR check location [facility] Preferred lab    Send INR reminders to ANTICOAG IMP   Comments Bilateral PE       Provider Role Specialty Phone number   Nyoka Cowden, MD  Pulmonary Disease [phone]        ANTICOAG TODAY: Anticoagulation Summary as of 04/14/2012              INR goal 2.0-3.0     Selected INR 1.80! (04/14/2012) Next INR check 04/28/2012   Weekly max dose (mg)  Target end date    Indications Long term (current) use of anticoagulants [V58.61]    Anticoagulation Episode Summary              INR check location [facility] Preferred lab    Send INR reminders to ANTICOAG IMP   Comments Bilateral PE       Provider Role Specialty Phone number   Nyoka Cowden, MD  Pulmonary Disease [phone]        PATIENT INSTRUCTIONS: Patient Instructions  Patient instructed to take medications as defined in the Anti-coagulation Track section of this encounter.  Patient instructed to take today's dose.  Patient verbalized understanding of these instructions.        FOLLOW-UP Return in 2 weeks (on 04/28/2012) for Follow up INR at  1030h.  Hulen Luster, III Pharm.D., CACP

## 2012-04-14 NOTE — Patient Instructions (Signed)
Patient instructed to take medications as defined in the Anti-coagulation Track section of this encounter.  Patient instructed to take today's dose.  Patient verbalized understanding of these instructions.    

## 2012-04-15 NOTE — Progress Notes (Signed)
Agree 

## 2012-04-28 ENCOUNTER — Ambulatory Visit (INDEPENDENT_AMBULATORY_CARE_PROVIDER_SITE_OTHER): Payer: PRIVATE HEALTH INSURANCE | Admitting: Pharmacist

## 2012-04-28 DIAGNOSIS — I2699 Other pulmonary embolism without acute cor pulmonale: Secondary | ICD-10-CM

## 2012-04-28 DIAGNOSIS — Z7901 Long term (current) use of anticoagulants: Secondary | ICD-10-CM

## 2012-04-28 LAB — POCT INR: INR: 2.6

## 2012-04-28 NOTE — Progress Notes (Signed)
Anti-Coagulation Progress Note  Victoria Campos is a 62 y.o. female who is currently on an anti-coagulation regimen.    RECENT RESULTS: Recent results are below, the most recent result is correlated with a dose of 63.75 mg. per week: Lab Results  Component Value Date   INR 2.6 04/28/2012   INR 1.80 04/14/2012   INR 2.20 02/11/2012    ANTI-COAG DOSE: Anticoagulation Dose Instructions as of 04/28/2012     Glynis Smiles Tue Wed Thu Fri Sat   New Dose 7.5 mg 11.25 mg 7.5 mg 11.25 mg 7.5 mg 11.25 mg 7.5 mg       ANTICOAG SUMMARY: Anticoagulation Episode Summary   Current INR goal 2.0-3.0  Next INR check 05/26/2012  INR from last check 2.6 (04/28/2012)  Weekly max dose   Target end date   [facility] check location [facility]  Preferred lab   Send INR reminders to ANTICOAG IMP   Indications  Long term (current) use of anticoagulants [V58.61]        Comments Bilateral PE      Anticoagulation Care Providers   Provider Role Specialty Phone number   Nyoka Cowden, MD  Pulmonary Disease [phone]      ANTICOAG TODAY: Anticoagulation Summary as of 04/28/2012   INR goal 2.0-3.0  Selected INR 2.6 (04/28/2012)  Next INR check 05/26/2012  Target end date    Indications  Long term (current) use of anticoagulants [V58.61]      Anticoagulation Episode Summary   [facility] check location [facility]   Preferred lab    Send INR reminders to ANTICOAG IMP   Comments Bilateral PE    Anticoagulation Care Providers   Provider Role Specialty Phone number   Nyoka Cowden, MD  Pulmonary Disease [phone]      PATIENT INSTRUCTIONS: Patient Instructions  Patient instructed to take medications as defined in the Anti-coagulation Track section of this encounter.  Patient instructed to take today's dose.  Patient verbalized understanding of these instructions.       FOLLOW-UP Return in 4 weeks (on 05/26/2012) for Follow up INR at 4:15PM.  Hulen Luster, III Pharm.D., CACP

## 2012-04-28 NOTE — Patient Instructions (Signed)
Patient instructed to take medications as defined in the Anti-coagulation Track section of this encounter.  Patient instructed to take today's dose.  Patient verbalized understanding of these instructions.    

## 2012-05-26 ENCOUNTER — Ambulatory Visit: Payer: PRIVATE HEALTH INSURANCE

## 2012-06-02 ENCOUNTER — Ambulatory Visit: Payer: PRIVATE HEALTH INSURANCE

## 2012-06-09 ENCOUNTER — Ambulatory Visit: Payer: PRIVATE HEALTH INSURANCE

## 2012-06-16 ENCOUNTER — Ambulatory Visit: Payer: PRIVATE HEALTH INSURANCE

## 2012-06-20 ENCOUNTER — Ambulatory Visit: Payer: PRIVATE HEALTH INSURANCE

## 2012-07-02 ENCOUNTER — Other Ambulatory Visit: Payer: Self-pay | Admitting: *Deleted

## 2012-07-02 NOTE — Telephone Encounter (Signed)
Also needs rx for Micardis HCT 80/25; not on current medlist.  Pt's changing pharmacy.

## 2012-07-04 ENCOUNTER — Other Ambulatory Visit: Payer: Self-pay | Admitting: Internal Medicine

## 2012-07-04 DIAGNOSIS — I10 Essential (primary) hypertension: Secondary | ICD-10-CM

## 2012-07-04 MED ORDER — TELMISARTAN-HCTZ 80-25 MG PO TABS: 1.0000 | ORAL_TABLET | Freq: Every day | ORAL | Status: DC

## 2012-07-04 MED ORDER — METOPROLOL TARTRATE 50 MG PO TABS: 50.0000 mg | ORAL_TABLET | Freq: Every day | ORAL | Status: DC

## 2012-08-02 ENCOUNTER — Encounter (HOSPITAL_COMMUNITY): Payer: Self-pay | Admitting: *Deleted

## 2012-08-02 ENCOUNTER — Emergency Department (HOSPITAL_COMMUNITY): Payer: No Typology Code available for payment source

## 2012-08-02 ENCOUNTER — Inpatient Hospital Stay (HOSPITAL_COMMUNITY)
Admission: EM | Admit: 2012-08-02 | Discharge: 2012-08-05 | DRG: 948 | Disposition: A | Discharge status: Home / Self Care | Payer: No Typology Code available for payment source | Attending: Internal Medicine | Admitting: Internal Medicine

## 2012-08-02 DIAGNOSIS — E785 Hyperlipidemia, unspecified: Secondary | ICD-10-CM

## 2012-08-02 DIAGNOSIS — Z87891 Personal history of nicotine dependence: Secondary | ICD-10-CM

## 2012-08-02 DIAGNOSIS — I998 Other disorder of circulatory system: Secondary | ICD-10-CM | POA: Diagnosis present

## 2012-08-02 DIAGNOSIS — M79609 Pain in unspecified limb: Secondary | ICD-10-CM | POA: Diagnosis present

## 2012-08-02 DIAGNOSIS — R894 Abnormal immunological findings in specimens from other organs, systems and tissues: Secondary | ICD-10-CM | POA: Diagnosis present

## 2012-08-02 DIAGNOSIS — Z86711 Personal history of pulmonary embolism: Secondary | ICD-10-CM

## 2012-08-02 DIAGNOSIS — G4700 Insomnia, unspecified: Secondary | ICD-10-CM | POA: Diagnosis present

## 2012-08-02 DIAGNOSIS — I10 Essential (primary) hypertension: Secondary | ICD-10-CM

## 2012-08-02 DIAGNOSIS — R209 Unspecified disturbances of skin sensation: Secondary | ICD-10-CM | POA: Diagnosis not present

## 2012-08-02 DIAGNOSIS — Z79899 Other long term (current) drug therapy: Secondary | ICD-10-CM

## 2012-08-02 DIAGNOSIS — Z7982 Long term (current) use of aspirin: Secondary | ICD-10-CM

## 2012-08-02 DIAGNOSIS — R791 Abnormal coagulation profile: Principal | ICD-10-CM | POA: Diagnosis present

## 2012-08-02 DIAGNOSIS — T45515A Adverse effect of anticoagulants, initial encounter: Secondary | ICD-10-CM | POA: Diagnosis present

## 2012-08-02 DIAGNOSIS — Z882 Allergy status to sulfonamides status: Secondary | ICD-10-CM

## 2012-08-02 DIAGNOSIS — J301 Allergic rhinitis due to pollen: Secondary | ICD-10-CM

## 2012-08-02 DIAGNOSIS — Z7901 Long term (current) use of anticoagulants: Secondary | ICD-10-CM

## 2012-08-02 DIAGNOSIS — Z8249 Family history of ischemic heart disease and other diseases of the circulatory system: Secondary | ICD-10-CM

## 2012-08-02 DIAGNOSIS — F411 Generalized anxiety disorder: Secondary | ICD-10-CM

## 2012-08-02 DIAGNOSIS — Z9229 Personal history of other drug therapy: Secondary | ICD-10-CM

## 2012-08-02 DIAGNOSIS — Z91199 Patient's noncompliance with other medical treatment and regimen due to unspecified reason: Secondary | ICD-10-CM

## 2012-08-02 DIAGNOSIS — F329 Major depressive disorder, single episode, unspecified: Secondary | ICD-10-CM

## 2012-08-02 DIAGNOSIS — I2699 Other pulmonary embolism without acute cor pulmonale: Secondary | ICD-10-CM

## 2012-08-02 DIAGNOSIS — E7800 Pure hypercholesterolemia, unspecified: Secondary | ICD-10-CM | POA: Diagnosis present

## 2012-08-02 DIAGNOSIS — R011 Cardiac murmur, unspecified: Secondary | ICD-10-CM | POA: Diagnosis present

## 2012-08-02 DIAGNOSIS — F3289 Other specified depressive episodes: Secondary | ICD-10-CM

## 2012-08-02 DIAGNOSIS — M7989 Other specified soft tissue disorders: Secondary | ICD-10-CM

## 2012-08-02 DIAGNOSIS — Z9119 Patient's noncompliance with other medical treatment and regimen: Secondary | ICD-10-CM

## 2012-08-02 DIAGNOSIS — Z800 Family history of malignant neoplasm of digestive organs: Secondary | ICD-10-CM

## 2012-08-02 MED ORDER — HYDROMORPHONE HCL PF 1 MG/ML IJ SOLN
1.0000 mg | Freq: Once | INTRAMUSCULAR | Status: DC
  Filled 2012-08-02: qty 1

## 2012-08-02 NOTE — ED Notes (Signed)
Pt states that she woke up yesterday morning with pain and swelling to left wrist and forearm; pt states that the pain has gotten progressively worse and she is unable to move wrist due to pain; pt also states that she has multiple bruises to arm with no injury; pt is on blood thinners due to history of PE

## 2012-08-03 ENCOUNTER — Inpatient Hospital Stay (HOSPITAL_COMMUNITY): Payer: No Typology Code available for payment source

## 2012-08-03 ENCOUNTER — Encounter (HOSPITAL_COMMUNITY): Payer: Self-pay | Admitting: Radiology

## 2012-08-03 DIAGNOSIS — M7989 Other specified soft tissue disorders: Secondary | ICD-10-CM

## 2012-08-03 DIAGNOSIS — J301 Allergic rhinitis due to pollen: Secondary | ICD-10-CM

## 2012-08-03 LAB — CBC
HCT: 35.9 % — ABNORMAL LOW (ref 36.0–46.0)
HCT: 43.8 % (ref 36.0–46.0)
HCT: 31.1 % — ABNORMAL LOW (ref 36.0–46.0)
Hemoglobin: 12.1 g/dL (ref 12.0–15.0)
Hemoglobin: 11.5 g/dL — ABNORMAL LOW (ref 12.0–15.0)
Hemoglobin: 10.8 g/dL — ABNORMAL LOW (ref 12.0–15.0)
MCH: 28 pg (ref 26.0–34.0)
MCHC: 32.4 g/dL (ref 30.0–36.0)
MCHC: 33.7 g/dL (ref 30.0–36.0)
MCHC: 34.7 g/dL (ref 30.0–36.0)
MCV: 82.8 fL (ref 78.0–100.0)
MCV: 83.1 fL (ref 78.0–100.0)
MCV: 82.8 fL (ref 78.0–100.0)
MCV: 83.2 fL (ref 78.0–100.0)
Platelets: 251 10*3/uL (ref 150–400)
Platelets: 224 10*3/uL (ref 150–400)
RBC: 4.02 MIL/uL (ref 3.87–5.11)
RDW: 13.7 % (ref 11.5–15.5)
RDW: 14 % (ref 11.5–15.5)
WBC: 6.8 10*3/uL (ref 4.0–10.5)
WBC: 5.3 10*3/uL (ref 4.0–10.5)

## 2012-08-03 LAB — COMPREHENSIVE METABOLIC PANEL
ALT: 25 U/L (ref 0–35)
AST: 24 U/L (ref 0–37)
Albumin: 3.3 g/dL — ABNORMAL LOW (ref 3.5–5.2)
Alkaline Phosphatase: 54 U/L (ref 39–117)
Calcium: 9.1 mg/dL (ref 8.4–10.5)
GFR calc Af Amer: 63 mL/min — ABNORMAL LOW (ref >90)
Potassium: 3.9 mEq/L (ref 3.5–5.1)
Sodium: 139 mEq/L (ref 135–145)
Total Protein: 6.8 g/dL (ref 6.0–8.3)

## 2012-08-03 LAB — ABO/RH: ABO/RH(D): O POS

## 2012-08-03 LAB — PROTIME-INR
INR: 0.89 (ref 0.00–1.49)
INR: 8.39 (ref 0.00–1.49)

## 2012-08-03 MED ORDER — IOHEXOL 300 MG/ML  SOLN
100.0000 mL | Freq: Once | INTRAMUSCULAR | Status: AC | PRN
  Administered 2012-08-03: 100 mL via INTRAVENOUS

## 2012-08-03 MED ORDER — WARFARIN - PHARMACIST DOSING INPATIENT: Freq: Every day | Status: DC

## 2012-08-03 MED ORDER — VITAMIN K1 10 MG/ML IJ SOLN
10.0000 mg | Freq: Once | INTRAVENOUS | Status: AC
  Administered 2012-08-03: 10 mg via INTRAVENOUS
  Filled 2012-08-03: qty 1

## 2012-08-03 MED ORDER — ONDANSETRON HCL 4 MG/2ML IJ SOLN: 4.0000 mg | Freq: Four times a day (QID) | INTRAMUSCULAR | Status: DC | PRN

## 2012-08-03 MED ORDER — WARFARIN SODIUM 7.5 MG PO TABS
7.5000 mg | ORAL_TABLET | Freq: Once | ORAL | Status: DC
  Filled 2012-08-03: qty 1

## 2012-08-03 MED ORDER — MORPHINE SULFATE 2 MG/ML IJ SOLN
1.0000 mg | INTRAMUSCULAR | Status: DC | PRN
  Administered 2012-08-03 – 2012-08-05 (×6): 1 mg via INTRAVENOUS
  Filled 2012-08-03 (×6): qty 1

## 2012-08-03 MED ORDER — ONDANSETRON HCL 4 MG PO TABS: 4.0000 mg | ORAL_TABLET | Freq: Four times a day (QID) | ORAL | Status: DC | PRN

## 2012-08-03 NOTE — Progress Notes (Addendum)
CRITICAL VALUE ALERT  Critical value received: INR 8.39  Date of notification: 08-03-12  Time of notification: 0420  Critical value read back: yes  Nurse who received alert: Alfonso Ellis, RN  MD notified (1st page): Dr. Lavena Bullion, reported in person  Time of first page: [phone]

## 2012-08-03 NOTE — Progress Notes (Signed)
MD paged and aware of pt's last hemoglobin and hct. No new orders at this time. Will continue to monitor.

## 2012-08-03 NOTE — Progress Notes (Signed)
Subjective: Doing ok, c/o left arm swelling/ bruising/pain which was noted on admission. No other s/s bleeding.  Objective: Vital signs in last 24 hours: Filed Vitals:   08/03/12 0331   BP: 143/75   Pulse: 86   Temp: 98.8 F (37.1 C)   TempSrc: Oral   Resp: 18   Height: 5' 4.5" (1.638 m)   Weight: 185 lb 3 oz (84 kg)   SpO2: 96%    Weight change:   Intake/Output Summary (Last 24 hours) at 08/03/12 1902 Last data filed at 08/03/12 1300  Gross per 24 hour  Intake    240 ml  Output      0 ml  Net    240 ml  General: NAD Lungs: CTA B/L Heart: RRR,  Abd: Soft, nontender,  BS X 4 acitive Ext: left arm localized edema and ecchymoses involving palmer side and distal dorsal side of left forearm. 2+ Radial and DP pulses Neurologic: alert & oriented X3, cranial nerves II-XII intact, strength normal in all extremities, sensation intact to light touch, and gait normal.  Skin: turgor normal. no rashes, necrosis or skin breakdown.   Lab Results: Basic Metabolic Panel:  Recent Labs Lab 08/03/12 0151  Victoria Campos 139  K 3.9  CL 107  CO2 27  GLUCOSE 96  BUN 11  CREATININE 1.07  CALCIUM 9.1   Liver Function Tests:  Recent Labs Lab 08/03/12 0151  AST 24  ALT 25  ALKPHOS 54  BILITOT 0.3  PROT 6.8  ALBUMIN 3.3*   CBC:  Recent Labs Lab 08/03/12 0151 08/03/12 1205   HGB 12.1 11.5*   HCT 35.9* 33.3*   WBC 6.6 6.8   PLT 233 224     Coagulation:  Recent Labs Lab 08/02/12 2337 08/03/12 0151 08/03/12 0925  LABPROT 12.0 63.3* 59.2*  INR 0.89 8.39* 7.66*   Studies/Results: Dg Forearm Left  08/03/2012   *RADIOLOGY REPORT*  Clinical Data: Swelling to the left distal forearm  LEFT FOREARM - 2 VIEW  Comparison: None.  Findings: There is diffuse soft tissue swelling about the distal forearm without underlying long bone fracture identified.  No radiopaque foreign body.  IMPRESSION: Distal left forearm soft tissue swelling.  No underlying osseous abnormality.   Original Report  Authenticated By: Victoria Campos, M.D.   Medications: I have reviewed the patient's current medications. Scheduled Meds: .  HYDROmorphone (DILAUDID) injection  1 mg Intramuscular Once   Continuous Infusions:  PRN Meds:.morphine injection, ondansetron (ZOFRAN) IV, ondansetron Assessment/Plan:  # supra therapeutic INR  -- Initial INR 0.89 along with HH of 14.2/43.8 were likely inaccurate lab results done at 2337. -- repeat INR was 8.39 and 7.66. HH 12.1/35.9 on admission -- Vitamin K 10 IV x 1 given -- daily INR  # left arm swelling/bruising/pain--Normal neurovascular exam B/L.   Xray indicated soft tissue swelling--most likely soft tissue bleeding/hematoma 2/2 supratherapeutic INR   Ultrasound indicated "No hematoma noted."  Discussed with [facility] on call surgeon.  - Areas of swelling are marked for close monitoring - Neuro vascular assessment q2h - trend CBC q6          --HGB 12.1>>11.5 ( no changes on left FA swelling) could be variation          --follow up CBC at 1800 pm.  - if CBC trending down or positive neurovascular signs---FFP, CT arm and consult surgeon.  - discussed with DR. Bhardwaj   Dispo: Disposition is deferred at this time, awaiting improvement of current medical problems.  Anticipated discharge in approximately 2-3 day(s).   The patient does have a current PCP (Victoria Campos, Victoria Campos, Victoria Campos), therefore is requiring OPC follow-up after discharge.   The patient does not have transportation limitations that hinder transportation to clinic appointments.  .Services Needed at time of discharge: Y = Yes, Blank = No PT:   OT:   RN:   Equipment:   Other:     LOS: 1 day   Victoria Campos 08/03/2012, 7:02 PM

## 2012-08-03 NOTE — Progress Notes (Signed)
I received a call from the nurse who reported HH 10.8/31.1 from 6 pm blood draw, and patient c/o 5th finger area numbness. Nurse reported no changes on left FA swelling and good neurovascular assessment otherwise.  --I discussed with the on-call radiologist about options for further imaging since the ultrasound of LFA revealed " no hematoma noted." Will proceed with the Left FA CT with contrast.  --Will consider FFP 1-2 units given slow HH trending down in the setting of supratherapeutic INR and location of bleeding is at the forearm where there is a tight compartment.  Above discussion communicated with the Night team who will eval patient.

## 2012-08-03 NOTE — H&P (Signed)
Hospital Admission Note Date: 08/03/2012  Patient name: Victoria Campos Medical record number: [id number] Date of birth: [birth year] Age: 62 y.o. Gender: female PCP: Kristie Cowman, MD  Medical Service: Internal Medicine Teaching Service  Attending physician:  Dr. Rogelia Boga    1st Contact: Dr. Shirlee Latch   [phone] 2nd Contact: Dr. Dierdre Searles    Pager:[phone] After 5 pm or weekends: 1st Contact:      Pager: [phone] 2nd Contact:      Pager: [phone]  Chief Complaint: left arm swelling and pain  History of Present Illness: Victoria Campos is a 62 yo woman with PMH of bilateral PE on warfarin anticoagulation who presents to the [facility] ED with left arm swelling that started on the day prior to admission. The swelling has progressively worsened since that time and has been accompanied by bruising and localized pain. She denies any recent injury to her left arm. She admits to not having her INR checked recently, but has continued to take her coumadin as prescribed at her last visit to the coumadin clinic. She also admits to some mild bruising of her bilateral ankles that has been present for the last 1-2 days as well. She denies any SOB or chest pain. She had a long trip with car ride to [place] about 2 weeks ago but denies any leg swelling following this trip.  She denies any recent surgery, family history of blood clots, or malignancy.   Meds: Current Facility-Administered Medications  Medication Dose Route Frequency Provider Last Rate Last Dose  . HYDROmorphone (DILAUDID) injection 1 mg  1 mg Intramuscular Once Sunnie Nielsen, MD      . morphine 2 MG/ML injection 1 mg  1 mg Intravenous Q4H PRN Mathis Dad, MD      . ondansetron [facility]) tablet 4 mg  4 mg Oral Q6H PRN Mathis Dad, MD       Or  . ondansetron [facility]) injection 4 mg  4 mg Intravenous Q6H PRN Mathis Dad, MD       Current Outpatient Prescriptions  Medication Sig Dispense Refill  . aspirin 81 MG chewable tablet Chew 81 mg by mouth daily.       . metoprolol (LOPRESSOR) 50 MG tablet Take 1 tablet (50 mg total) by mouth daily.  90 tablet  1  . mometasone (NASONEX) 50 MCG/ACT nasal spray Place 2 sprays into the nose daily.  17 g  2  . rosuvastatin (CRESTOR) 20 MG tablet Take 1 tablet (20 mg total) by mouth daily.  30 tablet  2  . telmisartan-hydrochlorothiazide (MICARDIS HCT) 80-25 MG per tablet Take 1 tablet by mouth daily.  90 tablet  1  . traZODone (DESYREL) 50 MG tablet Take 50 mg by mouth at bedtime.      Marland Kitchen warfarin (COUMADIN) 7.5 MG tablet Take as directed by coumadin clinic  90 tablet  2    Allergies: Allergies as of 08/02/2012 - Review Complete 04/28/2012  Allergen Reaction Noted  . Sulfamethoxazole w-trimethoprim rashes 10/26/2008   Past Medical History  Diagnosis Date  . Hypertension   . Hypercholesteremia   . Heart murmur   . Insomnia   . PE (pulmonary embolism)    Past Surgical History  Procedure Laterality Date  . [place]      removed  . Tubal ligation    . Arm surgery      torn ligamint   Family History  Problem Relation Age of Onset  . Cancer Brother     colon  .  Cancer Sister     colon  . Cancer Sister     breast  . Heart attack Father   . Heart disease Father    History   Social History  . Marital Status: Single    Spouse Name: N/A    Number of Children: N/A  . Years of Education: N/A   Occupational History  . Not on file.   Social History Main Topics  . Smoking status: Former Smoker    Types: Cigarettes    Quit date: 03/12/1968  . Smokeless tobacco: Never Used  . Alcohol Use: 0.6 oz/week    1 Glasses of wine per week     Comment: occasional  . Drug Use: No  . Sexually Active: Not on file   Other Topics Concern  . Not on file   Social History Narrative  . No narrative on file    Review of Systems: Constitutional: denies fever, +chills, diaphoresis, appetite change and fatigue.  HEENT: Denies photophobia, eye pain, redness, hearing loss, ear pain, congestion, sore throat,  rhinorrhea, sneezing, mouth sores, trouble swallowing, neck pain, neck stiffness and tinnitus.  Respiratory: Denies SOB, DOE, cough, chest tightness, and wheezing.  Cardiovascular: Denies chest pain, palpitations and leg swelling  Gastrointestinal: Denies nausea, vomiting, abdominal pain, diarrhea, constipation, blood in stool and abdominal distention.  Genitourinary: Denies dysuria, urgency, frequency, hematuria, flank pain and difficulty urinating.  Musculoskeletal: Denies myalgias, back pain, joint swelling, arthralgias and gait problem. Left arm swelling and pain Skin: Denies pallor, rash and wound.  Neurological: Denies dizziness, seizures, syncope, weakness, light-headedness, numbness and headaches.  Hematological: Denies adenopathy. +Easy bruising, personal or family bleeding history  Psychiatric/Behavioral: Denies suicidal ideation, mood changes, confusion, nervousness, sleep disturbance and agitation  Physical Exam: There were no vitals taken for this visit. General: alert, well-developed, and cooperative to examination.  Head: normocephalic and atraumatic.  Eyes: vision grossly intact, pupils equal, pupils round, pupils reactive to light, no injection and anicteric.  Mouth: pharynx pink and moist, no erythema, and no exudates.  Neck: supple, full ROM, no thyromegaly, no JVD, and no carotid bruits.  Lungs: normal respiratory effort, no accessory muscle use, normal breath sounds, no crackles, and no wheezes. Heart: normal rate, regular rhythm; 3/6 systolic murmur Abdomen: soft, non-tender, normal bowel sounds, no distention, no guarding, no rebound tenderness, no hepatomegaly, and no splenomegaly.  Msk: no joint swelling, no joint warmth, and no redness over joints.  Extremities: Localized edema and ecchymoses involving the distal L forearm; ecchymoses of the bilateral medial ankles, 2+ radial and DP pulses bilaterally Neurologic: alert & oriented X3, cranial nerves II-XII intact,  strength normal in all extremities, sensation intact to light touch, and gait normal.  Skin: turgor normal and no rashes.  Psych: Oriented X3, memory intact for recent and remote, normally interactive, good eye contact, not anxious appearing, and not depressed appearing.  Lab Results  Component Value Date   INR 8.39* 08/03/2012   INR 0.89 08/02/2012   INR 2.6 04/28/2012     Imaging results:  Dg Forearm Left  08/03/2012   *RADIOLOGY REPORT*  Clinical Data: Swelling to the left distal forearm  LEFT FOREARM - 2 VIEW  Comparison: None.  Findings: There is diffuse soft tissue swelling about the distal forearm without underlying long bone fracture identified.  No radiopaque foreign body.  IMPRESSION: Distal left forearm soft tissue swelling.  No underlying osseous abnormality.   Original Report Authenticated By: Christiana Pellant, M.D.    Assessment & Plan by  Problem: L arm swelling - XR of the L forearm was unrevealing of fracture. Given the presence of bruising in the area and patient's elevated INR of 8.39, it seems she is likely experiencing soft tissue bleeding as a result of being over-anticoagulated on warfarin as a result of continuing this medication at the same dose/frequency without being seen to have her INR checked in ~3 months. Hb = 14.2 in the [facility] ED. - admit to floor - hold warfarin - IV vitamin K - u/s LUE - check CBC - check CMET  Hx bilateral PE - diagnosed in 09/2011, and has been taking warfarin since that time. It appears she may need to remain on therapy indefinitely as she has been positive for lupus anticoagulant in the past.  - holding warfarin per above  HTN - BP mildly elevated on admission, likely 2/2 pain. Will likely resume home anti-HTN medications in the morning.   Dispo: Disposition is deferred at this time, awaiting improvement of current medical problems. Anticipated discharge in approximately 1-2 day(s).   The patient does have a current PCP (SCHOOLER, [name],  MD), therefore will be requiring OPC follow-up after discharge.   The patient does not have transportation limitations that hinder transportation to clinic appointments.  Signed: HO,MICHELE 08/03/2012, 1:55 AM

## 2012-08-03 NOTE — Progress Notes (Addendum)
ANTICOAGULATION CONSULT NOTE - Initial Consult  Pharmacy Consult for coumadin Indication: DVT  Allergies  Allergen Reactions  . Sulfamethoxazole W-Trimethoprim     REACTION: red rash    Patient Measurements: Height: 5' 4.5" (163.8 cm) Weight: 185 lb 3 oz (84 kg) IBW/kg (Calculated) : 55.85  Vital Signs: Temp: 98.8 F (37.1 C) (05/25 0331) Temp src: Oral (05/25 0331) BP: 143/75 mmHg (05/25 0331) Pulse Rate: 86 (05/25 0331)  Labs:  Recent Labs  08/02/12 2337 08/03/12 0151  HGB 14.2 12.1  HCT 43.8 35.9*  PLT 251 233  LABPROT 12.0  --   INR 0.89  --     Estimated Creatinine Clearance: 76.2 ml/min (by C-G formula based on Cr of 0.67).   Medical History: Past Medical History  Diagnosis Date  . Hypertension   . Hypercholesteremia   . Heart murmur   . Insomnia   . PE (pulmonary embolism)     Medications:  Prescriptions prior to admission  Medication Sig Dispense Refill  . aspirin 81 MG chewable tablet Chew 81 mg by mouth daily.      . metoprolol (LOPRESSOR) 50 MG tablet Take 1 tablet (50 mg total) by mouth daily.  90 tablet  1  . mometasone (NASONEX) 50 MCG/ACT nasal spray Place 2 sprays into the nose daily.  17 g  2  . rosuvastatin (CRESTOR) 20 MG tablet Take 1 tablet (20 mg total) by mouth daily.  30 tablet  2  . telmisartan-hydrochlorothiazide (MICARDIS HCT) 80-25 MG per tablet Take 1 tablet by mouth daily.  90 tablet  1  . traZODone (DESYREL) 50 MG tablet Take 50 mg by mouth at bedtime.      Marland Kitchen warfarin (COUMADIN) 7.5 MG tablet Take as directed by coumadin clinic  90 tablet  2    Assessment: 62 yo lady to continue coumadin for h/o PE.  She has multiple bruises to arm.  Her INR is 0.89 ffrom [facility] but INR here is >8  Hg 12.1, PTLC 233.  She does not remember her dose of coumadin but takes yellow pill (7.5 mg) Goal of Therapy:  INR 2-3 Monitor platelets by anticoagulation protocol: Yes   Plan:  Repeat PT/INR later today   Seay, Lora Poteet 08/03/2012,3:56  AM  She received 10mg  IV vitamin K at 0919 and repeat INR at 0925 = 7.66. Continue to hold coumadin and follow up INR in AM.  Louie Casa [facility] 08/03/2012, 11:10 AM

## 2012-08-03 NOTE — Progress Notes (Signed)
*  PRELIMINARY RESULTS* Vascular Ultrasound Left upper extremity venous duplex has been completed.  Preliminary findings: Left = no evidence of deep or superficial thrombosis. Area of concern at wrist was evaluated. No hematoma noted.  Farrel Demark, RDMS, RVT  08/03/2012, 9:32 AM

## 2012-08-03 NOTE — H&P (Signed)
Internal Medicine Teaching Service Attending Note Date: 08/03/2012  Patient name: Victoria Campos  Medical record number: [id number]  Date of birth: [birth year]   I have read the documentation on this case by Dr. Lavena Bullion and agree with it with the following additions/observations:   Victoria Campos is a 62 year old lady on coumadin for PE who did not check her INR for about 3 months and has been taking her coumadin as prescribed. She had a sudden localized swelling in her left forearm on Friday with a couple ecchymoses and she sought attention for this in the ER. She denies any pain any where else in her body. She denies bleeding from any other place in her body, or in her urine or stool. She denies shortness of breath, chest pressure, leg swelling, leg pain/cramps.She had a long trip with car ride to [place] about 2 weeks ago but denies any leg swelling following this trip. She denies any recent surgery, family history of blood clots, or malignancy. She has been having some easy bruising recently.   PMH and medications reviewed.  Social history reviewed. Former-smoker, quit 40 years ago.   ROS per HPI.   Exam:  Patient sitting in bed with hand on pillow. No acute distress noted. PERRL, EOMI, pharynx normal Lungs normal breath sounds, no wheezing or rales. Heart has normal s1s2, systolic murmur, no gallop. Left forearm has non-pitting, tense swelling more around the wrist with moderate tenderness and 1-2 ecchymoses on skin. Appears to be slightly warmer than the rest of the body. Mild restriction of movement at wrist and elbow, however is able to appose digits with no difficulty. Mild pain with stretching at wrist. Pulse intact.  A couple ecchymoses and other chronic discolored areas on legs. No pedal edema.  No other joints are swollen or tender.    Recent Labs Lab 08/02/12 2337 08/03/12 0151  HGB 14.2 12.1  HCT 43.8 35.9*  WBC 10.2 6.6  PLT 251 233    Recent Labs Lab 08/03/12 0151  NA  139  K 3.9  CL 107  CO2 27  GLUCOSE 96  BUN 11  CREATININE 1.07  CALCIUM 9.1    Recent Labs Lab 08/02/12 2337 08/03/12 0151  INR 0.89 8.39*   Imaging results:  Dg Forearm Left  08/03/2012 *RADIOLOGY REPORT* Clinical Data: Swelling to the left distal forearm LEFT FOREARM - 2 VIEW Comparison: None. Findings: There is diffuse soft tissue swelling about the distal forearm without underlying long bone fracture identified. No radiopaque foreign body. IMPRESSION: Distal left forearm soft tissue swelling. No underlying osseous abnormality. Original Report Authenticated By: Christiana Pellant, M.D.   Assessment and Plan  Left forearm swelling in the setting of coumadin use and supratheraputic INR - Likely bleed. Xray arm unrevealing. No fracture. We will proceed with ultrasound of the area. This is a tight compartment, and so far the patient has good pulse, good movement of her fingers, pain does not seem out of proportion to the injury, however, given that there is a tense swelling with pain during stretching at wrist, we would go ahead and call and ortho/surgical consult for possible need for evaluation for evacuation of hematoma, if any, and ruling out compartment syndrome, though it seems less likely to me. Agree with Vitamin K administration.   Rest per resident note.   Marland Kitchen  Victoria Campos 08/03/2012, 9:29 AM.

## 2012-08-03 NOTE — Progress Notes (Signed)
Pt c/o numbness in Left pinky, ring finger, and lateral aspect of hand. No new swelling or enlargement noted at this time. Extremity warm to touch, good capillary refill noted. MD notified and aware. Will continue to monitor.

## 2012-08-04 DIAGNOSIS — F411 Generalized anxiety disorder: Secondary | ICD-10-CM

## 2012-08-04 LAB — CBC
HCT: 31.3 % — ABNORMAL LOW (ref 36.0–46.0)
HCT: 31.1 % — ABNORMAL LOW (ref 36.0–46.0)
Hemoglobin: 10.1 g/dL — ABNORMAL LOW (ref 12.0–15.0)
Hemoglobin: 10.4 g/dL — ABNORMAL LOW (ref 12.0–15.0)
Hemoglobin: 10.4 g/dL — ABNORMAL LOW (ref 12.0–15.0)
Hemoglobin: 10.9 g/dL — ABNORMAL LOW (ref 12.0–15.0)
MCH: 28.7 pg (ref 26.0–34.0)
MCH: 29.2 pg (ref 26.0–34.0)
MCH: 29.3 pg (ref 26.0–34.0)
MCHC: 34.6 g/dL (ref 30.0–36.0)
MCHC: 35 g/dL (ref 30.0–36.0)
MCV: 83.1 fL (ref 78.0–100.0)
MCV: 83.2 fL (ref 78.0–100.0)
MCV: 84.4 fL (ref 78.0–100.0)
Platelets: 188 10*3/uL (ref 150–400)
RBC: 3.46 MIL/uL — ABNORMAL LOW (ref 3.87–5.11)
RBC: 3.53 MIL/uL — ABNORMAL LOW (ref 3.87–5.11)
RBC: 3.62 MIL/uL — ABNORMAL LOW (ref 3.87–5.11)
RBC: 3.71 MIL/uL — ABNORMAL LOW (ref 3.87–5.11)
RBC: 3.72 MIL/uL — ABNORMAL LOW (ref 3.87–5.11)
RDW: 13.7 % (ref 11.5–15.5)
WBC: 4.5 10*3/uL (ref 4.0–10.5)
WBC: 4.4 10*3/uL (ref 4.0–10.5)

## 2012-08-04 MED ORDER — TELMISARTAN-HCTZ 80-25 MG PO TABS: 1.0000 | ORAL_TABLET | Freq: Every day | ORAL | Status: DC

## 2012-08-04 MED ORDER — HYDROCHLOROTHIAZIDE 25 MG PO TABS
25.0000 mg | ORAL_TABLET | Freq: Every day | ORAL | Status: DC
  Administered 2012-08-04 – 2012-08-05 (×2): 25 mg via ORAL
  Filled 2012-08-04 (×2): qty 1

## 2012-08-04 MED ORDER — METOPROLOL TARTRATE 50 MG PO TABS
50.0000 mg | ORAL_TABLET | Freq: Every day | ORAL | Status: DC
  Administered 2012-08-04 – 2012-08-05 (×2): 50 mg via ORAL
  Filled 2012-08-04 (×2): qty 1

## 2012-08-04 MED ORDER — IRBESARTAN 300 MG PO TABS
300.0000 mg | ORAL_TABLET | Freq: Every day | ORAL | Status: DC
  Administered 2012-08-04 – 2012-08-05 (×2): 300 mg via ORAL
  Filled 2012-08-04 (×2): qty 1

## 2012-08-04 NOTE — Progress Notes (Signed)
Left arm swelling seems to have improved even from this am. Bruising still remains but no increased swelling. Patient states she is now able to move wrist. Denies any distress. Will monitor. Mamie Levers

## 2012-08-04 NOTE — Consult Note (Signed)
ORTHOPAEDIC CONSULTATION  REQUESTING PHYSICIAN: No att. providers found  Chief Complaint: left arm swelling and bruising  HPI: Victoria Campos is a 62 y.o. female who was admitted three days ago.  Her INR was supratherapeutic.  She denies and known trauma but developed spontaneous swelling and ecchymosis.  She reports she is much improved at present.  Past Medical History  Diagnosis Date  . Hypertension   . Hypercholesteremia   . Heart murmur   . Insomnia   . PE (pulmonary embolism)    Past Surgical History  Procedure Laterality Date  . [place]      removed  . Tubal ligation    . Arm surgery      torn ligamint   History   Social History  . Marital Status: Single    Spouse Name: N/A    Number of Children: N/A  . Years of Education: N/A   Social History Main Topics  . Smoking status: Former Smoker    Types: Cigarettes    Quit date: 03/12/1968  . Smokeless tobacco: Never Used  . Alcohol Use: 0.6 oz/week    1 Glasses of wine per week     Comment: occasional  . Drug Use: No  . Sexually Active: None   Other Topics Concern  . None   Social History Narrative  . None   Family History  Problem Relation Age of Onset  . Cancer Brother     colon  . Cancer Sister     colon  . Cancer Sister     breast  . Heart attack Father   . Heart disease Father    Allergies  Allergen Reactions  . Sulfamethoxazole W-Trimethoprim Rash    REACTION: red rash   Prior to Admission medications   Medication Sig Start Date End Date Taking? Authorizing Provider  aspirin 81 MG chewable tablet Chew 81 mg by mouth daily.   Yes Historical Provider, MD  metoprolol (LOPRESSOR) 50 MG tablet Take 1 tablet (50 mg total) by mouth daily. 07/02/12 07/02/13 Yes Manuela Schwartz, MD  rosuvastatin (CRESTOR) 20 MG tablet Take 20 mg by mouth at bedtime. 10/30/11 10/29/12 Yes Reuben Likes, MD  telmisartan-hydrochlorothiazide (MICARDIS HCT) 80-25 MG per tablet Take 1 tablet by mouth daily. 07/04/12  07/04/13 Yes Manuela Schwartz, MD  traZODone (DESYREL) 50 MG tablet Take 50 mg by mouth at bedtime as needed. For sleep 10/30/11 01/14/13 Yes Reuben Likes, MD  warfarin (COUMADIN) 7.5 MG tablet Take 7.5-11.25 mg by mouth every other day. Current regimen is to take 11.25mg  (1.5 tab) on Sun, 7.5mg  Mon, 11.25mg  on Tues & so forth 01/02/12  Yes Aletta Edouard, MD   Dg Forearm Left  08/03/2012   *RADIOLOGY REPORT*  Clinical Data: Swelling to the left distal forearm  LEFT FOREARM - 2 VIEW  Comparison: None.  Findings: There is diffuse soft tissue swelling about the distal forearm without underlying long bone fracture identified.  No radiopaque foreign body.  IMPRESSION: Distal left forearm soft tissue swelling.  No underlying osseous abnormality.   Original Report Authenticated By: Christiana Pellant, M.D.   Ct Forearm Left W Contrast  08/04/2012   *RADIOLOGY REPORT*  Clinical Data: Multiple areas of swelling and pain.  CT OF THE LEFT FOREARM WITH CONTRAST  Technique:  Multidetector CT imaging was performed following the standard protocol during bolus administration of intravenous contrast.  Contrast: OMNIPAQUE IOHEXOL 300 MG/ML  SOLN  Comparison: 08/02/2012.  Findings: Areas of subcutaneous fluid involving the  ventral and ulnar aspect of the forearm.  No rim enhancement to suggest abscess.  The underlying forearm musculature is grossly normal.  No underlying fracture.  There are degenerative changes noted at the wrist and elbow.  IMPRESSION:  1.  Nonspecific fluid in the subcutaneous fat involving the ventral and ulnar aspect of the forearm.  No discrete rim enhancing abscess. 2.  No acute fracture. 3.  Mild degenerative changes at the wrist and elbow.   Original Report Authenticated By: Rudie Meyer, M.D.    Positive ROS: All other systems have been reviewed and were otherwise negative with the exception of those mentioned in the HPI and as above.  Physical Exam: Vitals: Refer to  EMR. Constitutional:  WD, WN, NAD HEENT:  NCAT, EOMI Neuro/Psych:  Alert & oriented to person, place, and time; appropriate mood & affect Lymphatic: No generalized UE edema or lymphadenopathy Extremities / MSK:  The extremities are normal with respect to appearance, ranges of motion, joint stability, muscle strength/tone, sensation, & perfusion except as otherwise noted:   left arm with scattered large areas of ecchymosis.  Compartments soft.  Full active wrist and digital flexion and extension.  LT sensibility intact.  Intact motor.  Assessment: Spontaneous left UE ecchymosis following supratherapeutic INR from coumadin.  No evidence for compartment syndrome.  ROM presently good, with no significant increased pain with ROM.  Plan: No further treatment required other than encouraging continued ROM and keeping INR control tighter.  Cliffton Asters Janee Morn, MD     Mobile [phone] [facility] [address] [place], [place]  [address] [phone]

## 2012-08-04 NOTE — Progress Notes (Signed)
Utilization review completed. Daira Hine, RN, BSN. 

## 2012-08-04 NOTE — Progress Notes (Signed)
Subjective: Patients states her left arm pain was 10/10 now 4/10.  The swelling has decreased.  Swelling was more significant Saturday and patient could not move her left arm.  Left arm is more mobile today.  Patient states she wants to go home her grandaughter age 64 who was visiting over the weekend but she did not get to spend time.  Her granddaughter lives in [place].    Objective: Vital signs in last 24 hours: Filed Vitals:   08/03/12 0331   BP: 143/75   Pulse: 86   Temp: 98.8 F (37.1 C)   TempSrc: Oral   Resp: 18   Height: 5' 4.5" (1.638 m)   Weight: 185 lb 3 oz (84 kg)   SpO2: 96%    Weight change: 1 lb 1.6 oz (0.5 kg)  Intake/Output Summary (Last 24 hours) at 08/04/12 1013 Last data filed at 08/04/12 0220  Gross per 24 hour  Intake    803 ml  Output      0 ml  Net    803 ml   General: alert and oriented, NAD, at the sink washing up HEENT: [place]/at  Heart: RRR, no rubs, murmurs or gallops Lungs: ctab Abd: Soft, ntnd, normal bs  Ext: left arm decreased edema and scattered ecchymoses involving flexor and extensor forearm. Neurologic: alert & oriented X3,grossly neurologically intact Skin: turgor normal. no rashes, necrosis or skin breakdown.   Lab Results: Basic Metabolic Panel:  Recent Labs Lab 08/03/12 0151  NA 139  K 3.9  CL 107  CO2 27  GLUCOSE 96  BUN 11  CREATININE 1.07  CALCIUM 9.1   Liver Function Tests:  Recent Labs Lab 08/03/12 0151  AST 24  ALT 25  ALKPHOS 54  BILITOT 0.3  PROT 6.8  ALBUMIN 3.3*   CBC:  Recent Labs Lab 08/03/12 0151 08/03/12 1205   HGB 12.1 11.5*   HCT 35.9* 33.3*   WBC 6.6 6.8   PLT 233 224     Coagulation:  Recent Labs Lab 08/02/12 2337 08/03/12 0151 08/03/12 0925 08/04/12 0500  LABPROT 12.0 63.3* 59.2* 15.1  INR 0.89 8.39* 7.66* 1.21   Studies/Results: Dg Forearm Left  08/03/2012   *RADIOLOGY REPORT*  Clinical Data: Swelling to the left distal forearm  LEFT FOREARM - 2 VIEW  Comparison: None.   Findings: There is diffuse soft tissue swelling about the distal forearm without underlying long bone fracture identified.  No radiopaque foreign body.  IMPRESSION: Distal left forearm soft tissue swelling.  No underlying osseous abnormality.   Original Report Authenticated By: Christiana Pellant, M.D.   Ct Forearm Left W Contrast  08/04/2012   *RADIOLOGY REPORT*  Clinical Data: Multiple areas of swelling and pain.  CT OF THE LEFT FOREARM WITH CONTRAST  Technique:  Multidetector CT imaging was performed following the standard protocol during bolus administration of intravenous contrast.  Contrast: OMNIPAQUE IOHEXOL 300 MG/ML  SOLN  Comparison: 08/02/2012.  Findings: Areas of subcutaneous fluid involving the ventral and ulnar aspect of the forearm.  No rim enhancement to suggest abscess.  The underlying forearm musculature is grossly normal.  No underlying fracture.  There are degenerative changes noted at the wrist and elbow.  IMPRESSION:  1.  Nonspecific fluid in the subcutaneous fat involving the ventral and ulnar aspect of the forearm.  No discrete rim enhancing abscess. 2.  No acute fracture. 3.  Mild degenerative changes at the wrist and elbow.   Original Report Authenticated By: Rudie Meyer, M.D.  Medications: I have reviewed the patient's current medications. Scheduled Meds: .  HYDROmorphone (DILAUDID) injection  1 mg Intramuscular Once   Continuous Infusions:  PRN Meds:.morphine injection, ondansetron (ZOFRAN) IV, ondansetron Assessment/Plan: 62 y.o PMH HTN, dyslipidemia, pulmonary embolism with history of +lupus anticoagulant (09/2011).  She presents with left forearm swelling decreased INR and supratherapeutic INR 8.39.   1. Supratherapeutic INR -Initial INR 0.89-->8.39-->7.66-->1.21.  She was given Vitamin K 10 IV x 1 given  2. Left arm swelling/ecchymosis/pain, improving  -Xray indicated soft tissue swelling likely soft tissue bleeding/hematoma 2/2 supratherapeutic INR.  Patient  denies trauma or bite.   Ultrasound indicated no hematoma or DVT. CT arm indicated no acute fracture, mild degenerative changes to wrist and elbow.  Nonspecific fluid in the sq fat involving the ventral and ulnar aspect of the forearm.  No discrete rim enhancing abscess  - Areas of swelling are marked for close monitoring with no expansion and decreased swelling  - Neuro vascular assessment q2h - trending CBC. On admission 12.1 trended down to 10.1 today.  Patient was given 2 units of FFP   3. HTN -BP 160/81. Intermittently uncontrolled.  -Resumed Lopressor 50 mg qd, Micardis 80-25 mg qd today. Hold Aspirin for now  -Monitor VS    4. F/E/N -regular diet   5. DVT px  -scds  Dispo: Disposition is deferred at this time, awaiting improvement of current medical problems.  Anticipated discharge in approximately 1-2 day(s).   The patient does have a current PCP (Bosie Clos, [name], MD), therefore is requiring OPC follow-up after discharge.   The patient does not have transportation limitations that hinder transportation to clinic appointments.  .Services Needed at time of discharge: Y = Yes, Blank = No PT:   OT:   RN:   Equipment:   Other:     LOS: 2 days   Annett Gula [phone] 08/04/2012, 10:13 AM

## 2012-08-05 LAB — PREPARE FRESH FROZEN PLASMA: Unit division: 0

## 2012-08-05 LAB — CBC
HCT: 31.2 % — ABNORMAL LOW (ref 36.0–46.0)
HCT: 32.3 % — ABNORMAL LOW (ref 36.0–46.0)
HCT: 33 % — ABNORMAL LOW (ref 36.0–46.0)
Hemoglobin: 10.9 g/dL — ABNORMAL LOW (ref 12.0–15.0)
Hemoglobin: 11.6 g/dL — ABNORMAL LOW (ref 12.0–15.0)
MCH: 29.1 pg (ref 26.0–34.0)
MCH: 29.1 pg (ref 26.0–34.0)
MCH: 29.5 pg (ref 26.0–34.0)
MCHC: 34.9 g/dL (ref 30.0–36.0)
MCHC: 35.2 g/dL (ref 30.0–36.0)
MCV: 83.9 fL (ref 78.0–100.0)
RBC: 3.74 MIL/uL — ABNORMAL LOW (ref 3.87–5.11)
RBC: 3.85 MIL/uL — ABNORMAL LOW (ref 3.87–5.11)
RDW: 13.5 % (ref 11.5–15.5)
WBC: 4.9 10*3/uL (ref 4.0–10.5)

## 2012-08-05 LAB — PROTIME-INR
INR: 1.08 (ref 0.00–1.49)
Prothrombin Time: 13.9 seconds (ref 11.6–15.2)

## 2012-08-05 MED ORDER — WARFARIN SODIUM 7.5 MG PO TABS: 7.5000 mg | ORAL_TABLET | ORAL | Status: DC

## 2012-08-05 NOTE — Progress Notes (Signed)
Informed per MD; okay to d/c home when family arrives. Victoria Campos

## 2012-08-05 NOTE — Progress Notes (Signed)
ANTICOAGULATION CONSULT NOTE - Initial Consult  Pharmacy Consult for Coumadin Indication: hx PE and lupus anticoagulant positive  Allergies  Allergen Reactions  . Sulfamethoxazole W-Trimethoprim Rash    REACTION: red rash    Patient Measurements: Height: 5' 4.5" (163.8 cm) Weight: 184 lb 4.9 oz (83.6 kg) IBW/kg (Calculated) : 55.85  Vital Signs: Temp: 98.1 F (36.7 C) (05/27 0515) Temp src: Oral (05/27 0515) BP: 150/90 mmHg (05/27 0515) Pulse Rate: 79 (05/27 0515)  Labs:  Recent Labs  08/03/12 0151 08/03/12 0925  08/04/12 0500  08/04/12 1650 08/04/12 2325 08/05/12 0549  HGB 12.1  --   < > 10.1*  < > 10.8* 10.9* 10.9*  HCT 35.9*  --   < > 28.8*  < > 31.3* 31.1* 31.2*  PLT 233  --   < > 173  < > 205 196 194  LABPROT 63.3* 59.2*  --  15.1  --   --   --  13.9  INR 8.39* 7.66*  --  1.21  --   --   --  1.08  CREATININE 1.07  --   --   --   --   --   --   --   < > = values in this interval not displayed.  Estimated Creatinine Clearance: 56.9 ml/min (by C-G formula based on Cr of 1.07).   Medical History: Past Medical History  Diagnosis Date  . Hypertension   . Hypercholesteremia   . Heart murmur   . Insomnia   . PE (pulmonary embolism)     Medications:  Prescriptions prior to admission  Medication Sig Dispense Refill  . aspirin 81 MG chewable tablet Chew 81 mg by mouth daily.      . metoprolol (LOPRESSOR) 50 MG tablet Take 1 tablet (50 mg total) by mouth daily.  90 tablet  1  . rosuvastatin (CRESTOR) 20 MG tablet Take 20 mg by mouth at bedtime.      Marland Kitchen telmisartan-hydrochlorothiazide (MICARDIS HCT) 80-25 MG per tablet Take 1 tablet by mouth daily.  90 tablet  1  . traZODone (DESYREL) 50 MG tablet Take 50 mg by mouth at bedtime as needed. For sleep      . warfarin (COUMADIN) 7.5 MG tablet Take 7.5-11.25 mg by mouth every other day. Current regimen is to take 11.25mg  (1.5 tab) on Sun, 7.5mg  Mon, 11.25mg  on Tues & so forth      . [DISCONTINUED] rosuvastatin  (CRESTOR) 20 MG tablet Take 1 tablet (20 mg total) by mouth daily.  30 tablet  2  . [DISCONTINUED] warfarin (COUMADIN) 7.5 MG tablet Take as directed by coumadin clinic  90 tablet  2    Assessment: 62 y/o female on chronic Coumadin for hx PE and lupus anticoagulant positive who was admitted with swelling and bruising on her left forearm. PTA regimen is 7.5 mg alternating with 11.25 mg. Initially her INR was 0.89 on admit but the following day was >7, likely INR on admit was lab error. She was given vitamin K 10 mg IV on 5/25 and has had no Coumadin since. INR is subtherapeutic at 1.08 today. Bruising improved per MD notes. Spoke with patient about importance of monitoring INR every 4-6 weeks, consistent green intake, and taking Coumadin daily. Patient reports very few missed doses recently and no vomiting or diarrhea.   She needs close INR monitoring upon discharge (appt 6/2). Unsure why INR was high on admit but may need to decrease weekly dose if trends up quickly.  Goal of Therapy:  INR 2-3 Monitor platelets by anticoagulation protocol: Yes   Plan:  -Coumadin 11.25 mg po tonight -INR daily -Monitor for signs/symptoms of bleeding  [facility], [address].D., BCPS Clinical Pharmacist Pager: [phone] 08/05/2012 11:06 AM

## 2012-08-05 NOTE — Progress Notes (Signed)
Subjective: Patient states bruising to left forearm started last night overall she has improved ROM left forearm. Denies other complaints.   Objective: Vital signs in last 24 hours: Filed Vitals:   08/04/12 1124 08/04/12 1358 08/04/12 2042 08/05/12 0515  BP: 160/99 152/73 138/90 150/90  Pulse: 71 74 77 79  Temp:  98.6 F (37 C) 98.6 F (37 C) 98.1 F (36.7 C)  TempSrc:  Oral Oral Oral  Resp:  16 18 16   Height:      Weight:    184 lb 4.9 oz (83.6 kg)  SpO2:  99% 99% 96%   Weight change: -1 lb 15.8 oz (-0.9 kg) No intake or output data in the 24 hours ending 08/05/12 1001 Vitals reviewed. General: resting in bed, NAD, alert and oriented x 3  HEENT: [place]/at, no scleral icterus Cardiac: RRR, no rubs, murmurs or gallops Pulm: clear to auscultation bilaterally, no wheezes, rales, or rhonchi Abd: soft, nontender, nondistended, BS present Ext: warm and well perfused, no pedal edema, left forearm with extensive violaceous bruising flexor surface, improved edema and ROM Neuro: alert and oriented X3, grossly neurologically intact   Lab Results: Basic Metabolic Panel:  Recent Labs Lab 08/03/12 0151  NA 139  K 3.9  CL 107  CO2 27  GLUCOSE 96  BUN 11  CREATININE 1.07  CALCIUM 9.1   Liver Function Tests:  Recent Labs Lab 08/03/12 0151  AST 24  ALT 25  ALKPHOS 54  BILITOT 0.3  PROT 6.8  ALBUMIN 3.3*   CBC:  Recent Labs Lab 08/04/12 2325 08/05/12 0549  WBC 4.9 4.1  HGB 10.9* 10.9*  HCT 31.1* 31.2*  MCV 83.6 83.4  PLT 196 194   Coagulation:  Recent Labs Lab 08/03/12 0151 08/03/12 0925 08/04/12 0500 08/05/12 0549  LABPROT 63.3* 59.2* 15.1 13.9  INR 8.39* 7.66* 1.21 1.08   Misc. Labs: None   Studies/Results: Ct Forearm Left W Contrast  08/04/2012   *RADIOLOGY REPORT*  Clinical Data: Multiple areas of swelling and pain.  CT OF THE LEFT FOREARM WITH CONTRAST  Technique:  Multidetector CT imaging was performed following the standard protocol during bolus  administration of intravenous contrast.  Contrast: OMNIPAQUE IOHEXOL 300 MG/ML  SOLN  Comparison: 08/02/2012.  Findings: Areas of subcutaneous fluid involving the ventral and ulnar aspect of the forearm.  No rim enhancement to suggest abscess.  The underlying forearm musculature is grossly normal.  No underlying fracture.  There are degenerative changes noted at the wrist and elbow.  IMPRESSION:  1.  Nonspecific fluid in the subcutaneous fat involving the ventral and ulnar aspect of the forearm.  No discrete rim enhancing abscess. 2.  No acute fracture. 3.  Mild degenerative changes at the wrist and elbow.   Original Report Authenticated By: Rudie Meyer, M.D.   Medications:  Scheduled Meds: . hydrochlorothiazide  25 mg Oral Daily  .  HYDROmorphone (DILAUDID) injection  1 mg Intramuscular Once  . irbesartan  300 mg Oral Daily  . metoprolol  50 mg Oral Daily   Continuous Infusions:  PRN Meds:.morphine injection, ondansetron (ZOFRAN) IV, ondansetron Assessment/Plan: 62 y.o PMH HTN, dyslipidemia, pulmonary embolism with history of +lupus anticoagulant (09/2011). She presents with left forearm swelling decreased INR then supratherapeutic INR 8.39.   1. Supratherapeutic INR  -Initial INR 0.89-->8.39-->7.66-->1.21-->1.08. She was given Vitamin K 10 IV x 1 given this admission and transfused 2 units of FFP.  -she will have repeat INR 08/11/12 with Dr. Alexandria Lodge  2. Left arm swelling/ecchymosis/pain,  improving  -likely secondary to supratherapuetic INR - Areas of swelling are marked for close monitoring with decreased swelling noted and violaceous ecchymosis extending on forearm - trending CBC. Prior to admission 14.2 (likely hemoconcentrated).  On admission 12.1 stable at 10.9 today. Patient was given 2 units of FFP this admission  -f/u CBC outpatient appears baseline Hbg fluctuates 10-13.    3. HTN  -Intermittently uncontrolled. BP 150/90 this am  -Resumed Lopressor 50 mg qd, She is on Micardis  80-25 mg qd at home so she is taking Avapro 300 mg qd. Hold Aspirin for now.  She will resume at discharge home meds -Monitor VS   4. History of PE -She was alternating between 7.5 and 11.25 tablets at home.   -Spoke with pharmacist who recommends resuming this dose and f/u with Dr. Alexandria Lodge 08/11/12  5. F/E/N  -regular diet   6. DVT px  -scds   Dispo: d/c today    The patient does have a current PCP (SCHOOLER, [name], MD), therefore will be requiring [facility]PC follow-up after discharge.   Unknown if the patient has transportation limitations that hinder transportation to clinic appointments.  .Services Needed at time of discharge: Y = Yes, Blank = No PT:   OT:   RN:   Equipment:   Other:     LOS: 3 days   Annett Gula [phone] 08/05/2012, 10:01 AM

## 2012-08-05 NOTE — Discharge Summary (Signed)
Internal Medicine Teaching [facility] Discharge Note  Name: Victoria Campos MRN: [id number] DOB: 04-Oct-1950 62 y.o.  Date of Admission: 08/02/2012 11:12 PM Date of Discharge: 08/05/2012 Attending Physician: Dr. Rogelia Boga   Discharge Diagnosis: 1. Supratherapeutic INR  2. Left forearm swelling/ecchymosis/pain/numbness  3. Hypertension history 4. History of Pulmonary Embolism on anticoagulants   Discharge Medications:   Medication List    TAKE these medications       aspirin 81 MG chewable tablet  Chew 81 mg by mouth daily.     metoprolol 50 MG tablet  Commonly known as:  LOPRESSOR  Take 1 tablet (50 mg total) by mouth daily.     rosuvastatin 20 MG tablet  Commonly known as:  CRESTOR  Take 20 mg by mouth at bedtime.     telmisartan-hydrochlorothiazide 80-25 MG per tablet  Commonly known as:  MICARDIS HCT  Take 1 tablet by mouth daily.     traZODone 50 MG tablet  Commonly known as:  DESYREL  Take 50 mg by mouth at bedtime as needed. For sleep     warfarin 7.5 MG tablet  Commonly known as:  COUMADIN  Take 1-1.5 tablets (7.5-11.25 mg total) by mouth every other day. Current regimen is to take 11.25mg  (1.5 tab) on Sun, 7.5mg  Mon, 11.25mg  on Tues & so forth        Disposition and follow-up:   Victoria Campos was discharged [facility] in stable condition.  At the hospital follow up visit please address  1) BMET, CBC, INR 2) Address medication compliance (i.e Coumadin)  Follow-up Appointments:  Discharge Orders   Future Appointments Provider Department Dept Phone   08/11/2012 10:00 AM [facility] [facility] [phone]   08/14/2012 3:45 PM Annett Gula, MD [facility] [phone]   Future Orders Complete By Expires     Discharge instructions  As directed     Comments:      Please follow up with Dr. Alexandria Lodge 08/11/12 at 10 am and [facility] Dr. Shirlee Latch 08/14/12 at 3:45 PM  [phone] Please take Coumadin as your did prior to admission 7.5 mg alternating with 11.25 mg       Consultations:  Dr. Onalee Hua Thompson-orthopedic surgery  Procedures Performed:  Dg Forearm Left  08/03/2012   *RADIOLOGY REPORT*  Clinical Data: Swelling to the left distal forearm  LEFT FOREARM - 2 VIEW  Comparison: None.  Findings: There is diffuse soft tissue swelling about the distal forearm without underlying long bone fracture identified.  No radiopaque foreign body.  IMPRESSION: Distal left forearm soft tissue swelling.  No underlying osseous abnormality.   Original Report Authenticated By: Christiana Pellant, M.D.   Ct Forearm Left W Contrast  08/04/2012   *RADIOLOGY REPORT*  Clinical Data: Multiple areas of swelling and pain.  CT OF THE LEFT FOREARM WITH CONTRAST  Technique:  Multidetector CT imaging was performed following the standard protocol during bolus administration of intravenous contrast.  Contrast: OMNIPAQUE IOHEXOL 300 MG/ML  SOLN  Comparison: 08/02/2012.  Findings: Areas of subcutaneous fluid involving the ventral and ulnar aspect of the forearm.  No rim enhancement to suggest abscess.  The underlying forearm musculature is grossly normal.  No underlying fracture.  There are degenerative changes noted at the wrist and elbow.  IMPRESSION:  1.  Nonspecific fluid in the subcutaneous fat involving the ventral and ulnar aspect of the forearm.  No discrete rim enhancing abscess. 2.  No  acute fracture. 3.  Mild degenerative changes at the wrist and elbow.   Original Report Authenticated By: Rudie Meyer, M.D.    2D Echo: none  Cardiac Cath: none   Admission HPI: Chief Complaint: left arm swelling and pain  History of Present Illness:  Ms. Victoria Campos is a 62 yo woman with PMH of bilateral PE on warfarin anticoagulation who presents to the [facility] ED with left arm swelling that started on the day prior to admission. The swelling has progressively worsened since that time and has been  accompanied by bruising and localized pain. She denies any recent injury to her left arm. She admits to not having her INR checked recently, but has continued to take her coumadin as prescribed at her last visit to the coumadin clinic. She also admits to some mild bruising of her bilateral ankles that has been present for the last 1-2 days as well. She denies any SOB or chest pain. She had a long trip with car ride to [place] about 2 weeks ago but denies any leg swelling following this trip. She denies any recent surgery, family history of blood clots, or malignancy.   Review of Systems:  Constitutional: denies fever, +chills, diaphoresis, appetite change and fatigue.  HEENT: Denies photophobia, eye pain, redness, hearing loss, ear pain, congestion, sore throat, rhinorrhea, sneezing, mouth sores, trouble swallowing, neck pain, neck stiffness and tinnitus.  Respiratory: Denies SOB, DOE, cough, chest tightness, and wheezing.  Cardiovascular: Denies chest pain, palpitations and leg swelling  Gastrointestinal: Denies nausea, vomiting, abdominal pain, diarrhea, constipation, blood in stool and abdominal distention.  Genitourinary: Denies dysuria, urgency, frequency, hematuria, flank pain and difficulty urinating.  Musculoskeletal: Denies myalgias, back pain, joint swelling, arthralgias and gait problem. Left arm swelling and pain  Skin: Denies pallor, rash and wound.  Neurological: Denies dizziness, seizures, syncope, weakness, light-headedness, numbness and headaches.  Hematological: Denies adenopathy. +Easy bruising, personal or family bleeding history  Psychiatric/Behavioral: Denies suicidal ideation, mood changes, confusion, nervousness, sleep disturbance and agitation   Physical Exam:  There were no vitals taken for this visit.  General: alert, well-developed, and cooperative to examination.  Head: normocephalic and atraumatic.  Eyes: vision grossly intact, pupils equal, pupils round, pupils  reactive to light, no injection and anicteric.  Mouth: pharynx pink and moist, no erythema, and no exudates.  Neck: supple, full ROM, no thyromegaly, no JVD, and no carotid bruits.  Lungs: normal respiratory effort, no accessory muscle use, normal breath sounds, no crackles, and no wheezes. Heart: normal rate, regular rhythm; 3/6 systolic murmur Abdomen: soft, non-tender, normal bowel sounds, no distention, no guarding, no rebound tenderness, no hepatomegaly, and no splenomegaly.  Msk: no joint swelling, no joint warmth, and no redness over joints.  Extremities: Localized edema and ecchymoses involving the distal L forearm; ecchymoses of the bilateral medial ankles, 2+ radial and DP pulses bilaterally Neurologic: alert & oriented X3, cranial nerves II-XII intact, strength normal in all extremities, sensation intact to light touch, and gait normal.  Skin: turgor normal and no rashes.  Psych: Oriented X3, memory intact for recent and remote, normally interactive, good eye contact, not anxious appearing, and not depressed appearing.   Hospital Course by problem list: 1. Supratherapeutic INR  2. Left forearm swelling/ecchymosis/pain/numbness  3. Hypertension history 4. History of Pulmonary Embolism on anticoagulants   62 y.o PMH hypertension, dyslipidemia, pulmonary embolism with history of +lupus anticoagulant (09/2011). She presents with left forearm swelling, bruising, pain.  She initially had subtherapeutic INR then supratherapeutic INR  8.39.   1. Supratherapeutic INR  Initial INR 0.89-->8.39-->7.66-->1.21-->1.08. She was given Vitamin K 10 IV x 1 given this admission and transfused 2 units of FFP.  She will have repeat INR 08/11/12 with Dr. Alexandria Lodge after resuming her home dose of Coumadin.    2. Left forearm swelling/ecchymosis/pain/numbness  See all imaging studies.  Initial concern for compartment syndrome and the patient's case was discussed with Vascular surgery on call.  Patient denied  trauma or bite.  Likely secondary to supratherapuetic INR.  Ecchymosis was more extensive by discharge but pain and swelling decreased and range of motion increased.  This admission areas of swelling were marked for close monitoring with decreased swelling noted and violaceous ecchymosis extending on forearm.  We trended her CBC this admission.  Prior to admission hemoglobin was 14.2 (likely hemoconcentrated). On admission 12.1 stable at 10.9 on day of discharge. Patient was given 2 units of FFP this admission.  Please follow up CBC outpatient it appears her baseline hemoglobin fluctuates 10-13.   3. Hypertension history She is intermittently uncontrolled.  We initially held her home blood pressure medications then resumed them prior to discharge Lopressor 50 mg daily.  Also, she was on Micardis 80-25 mg qd at home.  This admission we gave her Avapro 300 mg daily.  We held Aspirin this admission but resume at discharge.     4. History of Pulmonary Embolism on anticoagulants  She has a history of positive lupus anticoagulant 09/2011.  She was alternating between 7.5 mg and 11.25 tablets at home. We continued this at discharge per pharmacy.  She will follow up with Dr. Alexandria Lodge 08/11/12.  She has a history of intermittent noncompliance with Coumadin and being lost to follow up with Dr. Alexandria Lodge.    She was given sequential compression devices for DVT prophylaxis this admission.   Discharge Vitals:  BP 124/81  Pulse 71  Temp(Src) 98.4 F (36.9 C) (Oral)  Resp 20  Ht 5' 4.5" (1.638 m)  Wt 184 lb 4.9 oz (83.6 kg)  BMI 31.16 kg/m2  SpO2 96%  Discharge physical exam: Vitals reviewed.  General: resting in bed, NAD, alert and oriented x 3  HEENT: [place]/at, no scleral icterus  Cardiac: RRR, no rubs, murmurs or gallops  Pulm: clear to auscultation bilaterally, no wheezes, rales, or rhonchi  Abd: soft, nontender, nondistended, BS present  Ext: warm and well perfused, no pedal edema, left forearm with extensive  violaceous bruising flexor surface, improved edema and ROM  Neuro: alert and oriented X3, grossly neurologically intact   Discharge Labs:  Results for ANNALIZ, AVEN (MRN [id number]) as of 08/06/2012 23:17  Ref. Range 08/03/2012 01:51  Sodium Latest Range: 135-145 mEq/L 139  Potassium Latest Range: 3.5-5.1 mEq/L 3.9  Chloride Latest Range: 96-112 mEq/L 107  CO2 Latest Range: 19-32 mEq/L 27  BUN Latest Range: 6-23 mg/dL 11  Creatinine Latest Range: 0.50-1.10 mg/dL 4.09  Calcium Latest Range: 8.4-10.5 mg/dL 9.1  GFR calc non Af Amer Latest Range: >90 mL/min 54 (L)  GFR calc Af Amer Latest Range: >90 mL/min 63 (L)  Glucose Latest Range: 70-99 mg/dL 96  Alkaline Phosphatase Latest Range: 39-117 U/L 54  Albumin Latest Range: 3.5-5.2 g/dL 3.3 (L)  AST Latest Range: 0-37 U/L 24  ALT Latest Range: 0-35 U/L 25  Total Protein Latest Range: 6.0-8.3 g/dL 6.8  Total Bilirubin Latest Range: 0.3-1.2 mg/dL 0.3   Results for MARIADEJESUS, CADE (MRN [id number]) as of 08/06/2012 23:17  Ref. Range 09/25/2011 04:14 09/27/2011  04:48 08/02/2012 23:37 08/03/2012 01:51 08/03/2012 12:05 08/03/2012 18:00 08/04/2012 00:30 08/04/2012 05:00 08/04/2012 11:10 08/04/2012 16:50 08/04/2012 23:25 08/05/2012 05:49 08/05/2012 12:20 08/05/2012 17:22  WBC Latest Range: 4.0-10.5 K/uL 5.2 4.7 10.2 6.6 6.8 5.3 4.9 4.3 4.5 4.4 4.9 4.1 4.9 4.7  RBC Latest Range: 3.87-5.11 MIL/uL 3.55 (L) 3.56 (L) 5.29 (H) 4.32 4.02 3.74 (L) 3.62 (L) 3.46 (L) 3.53 (L) 3.71 (L) 3.72 (L) 3.74 (L) 3.85 (L) 3.93  Hemoglobin Latest Range: 12.0-15.0 g/dL 16.1 (L) 09.6 (L) 04.5 12.1 11.5 (L) 10.8 (L) 10.4 (L) 10.1 (L) 10.4 (L) 10.8 (L) 10.9 (L) 10.9 (L) 11.2 (L) 11.6 (L)  HCT Latest Range: 36.0-46.0 % 29.3 (L) 28.8 (L) 43.8 35.9 (L) 33.3 (L) 31.1 (L) 30.1 (L) 28.8 (L) 29.6 (L) 31.3 (L) 31.1 (L) 31.2 (L) 32.3 (L) 33.0 (L)  MCV Latest Range: 78.0-100.0 fL 82.5 80.9 82.8 83.1 82.8 83.2 83.1 83.2 83.9 84.4 83.6 83.4 83.9 84.0  MCH Latest Range: 26.0-34.0 pg 28.5 28.7 26.8 28.0 28.6  28.9 28.7 29.2 29.5 29.1 29.3 29.1 29.1 29.5  MCHC Latest Range: 30.0-36.0 g/dL 40.9 81.1 91.4 78.2 95.6 34.7 34.6 35.1 35.1 34.5 35.0 34.9 34.7 35.2  RDW Latest Range: 11.5-15.5 % 12.1 12.0 14.0 13.7 13.8 13.7 13.7 13.7 13.8 13.7 13.6 13.7 13.7 13.5  Platelets Latest Range: 150-400 K/uL 208 230 251 233 224 199 194 173 188 205  196 194 234 239   Results for BREYA, CASS (MRN [id number]) as of 08/06/2012 23:17  Ref. Range 08/02/2012 23:37 08/03/2012 01:51 08/03/2012 09:25 08/04/2012 05:00 08/05/2012 05:49  Prothrombin Time Latest Range: 11.6-15.2 seconds 12.0 63.3 (H) 59.2 (H) 15.1 13.9  INR Latest Range: 0.00-1.49  0.89 8.39 (HH) 7.66 (HH) 1.21 1.08    Signed: Annett Gula 08/06/2012, 11:35 PM   Time Spent on Discharge: >30 minutes  Services Ordered on Discharge: none  Equipment Ordered on Discharge: none

## 2012-08-06 NOTE — Care Management Note (Signed)
    Page 1 of 1   08/06/2012     1:54:56 PM   CARE MANAGEMENT NOTE 08/06/2012  Patient:  TINY, CHAUDHARY   Account Number:  [id number]  Date Initiated:  08/05/2012  Documentation initiated by:  Dickie Cloe  Subjective/Objective Assessment:   PT ADM WITH LT ARM PAIN, BRUISING, AND SWELLING ON 08/02/12. PTA, PT RESIDES AT HOME WITH DAUGHTER.     Action/Plan:   NO DC NEEDS ANTICIPATED.  WILL FOLLOW PROGRESS.   Anticipated DC Date:  08/05/2012   Anticipated DC Plan:  HOME/SELF CARE      DC Planning Services  CM consult      Choice offered to / List presented to:             Status of service:  Completed, signed off Medicare Important Message given?   (If response is "NO", the following Medicare IM given date fields will be blank) Date Medicare IM given:   Date Additional Medicare IM given:    Discharge Disposition:  HOME/SELF CARE  Per UR Regulation:  Reviewed for med. necessity/level of care/duration of stay  If discussed at Long Length of Stay Meetings, dates discussed:    Comments:

## 2012-08-07 NOTE — ED Provider Notes (Signed)
History     CSN: [id number]  Arrival date & time 08/02/12  2308   First MD Initiated Contact with Patient 08/02/12 2334      Chief Complaint  Patient presents with  . Arm Swelling    (Consider location/radiation/quality/duration/timing/severity/associated sxs/prior treatment) HPI HX per PT - is on Coumadin for h/o PE< developed spont LUE swelling, no trauma, no change in coumadin dose. No new medications. Has associated MOD sharp pains, no weakness or numbness, no CP or SOB.  Past Medical History  Diagnosis Date  . Hypertension   . Hypercholesteremia   . Heart murmur   . Insomnia   . PE (pulmonary embolism)     Past Surgical History  Procedure Laterality Date  . [place]      removed  . Tubal ligation    . Arm surgery      torn ligamint    Family History  Problem Relation Age of Onset  . Cancer Brother     colon  . Cancer Sister     colon  . Cancer Sister     breast  . Heart attack Father   . Heart disease Father     History  Substance Use Topics  . Smoking status: Former Smoker    Types: Cigarettes    Quit date: 03/12/1968  . Smokeless tobacco: Never Used  . Alcohol Use: 0.6 oz/week    1 Glasses of wine per week     Comment: occasional    OB History   Grav Para Term Preterm Abortions TAB SAB Ect Mult Living                  Review of Systems  Constitutional: Negative for fever and chills.  HENT: Negative for neck pain and neck stiffness.   Eyes: Negative for pain.  Respiratory: Negative for shortness of breath.   Cardiovascular: Negative for chest pain and leg swelling.  Gastrointestinal: Negative for abdominal pain.  Genitourinary: Negative for dysuria.  Musculoskeletal: Negative for back pain.  Skin: Negative for rash.  Neurological: Negative for headaches.  All other systems reviewed and are negative.    Allergies  Sulfamethoxazole w-trimethoprim  Home Medications   Current Outpatient Rx  Name  Route  Sig  Dispense  Refill  .  aspirin 81 MG chewable tablet   Oral   Chew 81 mg by mouth daily.         . metoprolol (LOPRESSOR) 50 MG tablet   Oral   Take 1 tablet (50 mg total) by mouth daily.   90 tablet   1   . rosuvastatin (CRESTOR) 20 MG tablet   Oral   Take 20 mg by mouth at bedtime.         Marland Kitchen telmisartan-hydrochlorothiazide (MICARDIS HCT) 80-25 MG per tablet   Oral   Take 1 tablet by mouth daily.   90 tablet   1   . traZODone (DESYREL) 50 MG tablet   Oral   Take 50 mg by mouth at bedtime as needed. For sleep         . warfarin (COUMADIN) 7.5 MG tablet   Oral   Take 1-1.5 tablets (7.5-11.25 mg total) by mouth every other day. Current regimen is to take 11.25mg  (1.5 tab) on Sun, 7.5mg  Mon, 11.25mg  on Tues & so forth   10 tablet   0     BP 124/81  Pulse 71  Temp(Src) 98.4 F (36.9 C) (Oral)  Resp 20  Ht 5' 4.5" (1.638  m)  Wt 184 lb 4.9 oz (83.6 kg)  BMI 31.16 kg/m2  SpO2 96%  Physical Exam  Nursing note and vitals reviewed. Constitutional: She is oriented to person, place, and time. She appears well-developed and well-nourished.  HENT:  Head: Normocephalic and atraumatic.  Eyes: EOM are normal. Pupils are equal, round, and reactive to light.  Neck: Neck supple.  Cardiovascular: Normal heart sounds and intact distal pulses.   Pulmonary/Chest: Effort normal and breath sounds normal. No respiratory distress.  Musculoskeletal: Normal range of motion.  LUE sig swelling to FA with distal N/V intact, min ecchymosis  Neurological: She is alert and oriented to person, place, and time.  Skin: Skin is warm and dry.    ED Course  Procedures (including critical care time)  Labs Reviewed  CBC - Abnormal; Notable for the following:    RBC 5.29 (*)    All other components within normal limits  COMPREHENSIVE METABOLIC PANEL - Abnormal; Notable for the following:    Albumin 3.3 (*)    GFR calc non Af Amer 54 (*)    GFR calc Af Amer 63 (*)    All other components within normal limits   CBC - Abnormal; Notable for the following:    HCT 35.9 (*)    All other components within normal limits  PROTIME-INR - Abnormal; Notable for the following:    Prothrombin Time 63.3 (*)    INR 8.39 (*)    All other components within normal limits  PROTIME-INR - Abnormal; Notable for the following:    Prothrombin Time 59.2 (*)    INR 7.66 (*)    All other components within normal limits  CBC - Abnormal; Notable for the following:    Hemoglobin 11.5 (*)    HCT 33.3 (*)    All other components within normal limits  CBC - Abnormal; Notable for the following:    RBC 3.74 (*)    Hemoglobin 10.8 (*)    HCT 31.1 (*)    All other components within normal limits  CBC - Abnormal; Notable for the following:    RBC 3.62 (*)    Hemoglobin 10.4 (*)    HCT 30.1 (*)    All other components within normal limits  CBC - Abnormal; Notable for the following:    RBC 3.46 (*)    Hemoglobin 10.1 (*)    HCT 28.8 (*)    All other components within normal limits  CBC - Abnormal; Notable for the following:    RBC 3.53 (*)    Hemoglobin 10.4 (*)    HCT 29.6 (*)    All other components within normal limits  CBC - Abnormal; Notable for the following:    RBC 3.71 (*)    Hemoglobin 10.8 (*)    HCT 31.3 (*)    All other components within normal limits  CBC - Abnormal; Notable for the following:    RBC 3.72 (*)    Hemoglobin 10.9 (*)    HCT 31.1 (*)    All other components within normal limits  CBC - Abnormal; Notable for the following:    RBC 3.74 (*)    Hemoglobin 10.9 (*)    HCT 31.2 (*)    All other components within normal limits  CBC - Abnormal; Notable for the following:    RBC 3.85 (*)    Hemoglobin 11.2 (*)    HCT 32.3 (*)    All other components within normal limits  CBC - Abnormal; Notable  for the following:    Hemoglobin 11.6 (*)    HCT 33.0 (*)    All other components within normal limits  PROTIME-INR  PROTIME-INR  PROTIME-INR  PREPARE FRESH FROZEN PLASMA  ABO/RH   No  results found.   1. Left arm swelling   2. Depressive disorder, not elsewhere classified   3. Essential hypertension, benign   4. Long term (current) use of anticoagulants   5. Other and unspecified hyperlipidemia   6. Pulmonary embolism   7. Allergic rhinitis due to pollen   8. Anxiety state, unspecified     No labs available at [facility] at time of evaluations, no imaging available.  unkown unexpected unplanned downtime.  I consulted [facility] and resident accepts PT in [place] to [facility] for admit, labs and further evaluation  MDM  LUE swelling on coumadin  MED admit/ [facility]        Sunnie Nielsen, MD 08/07/12 [phone]

## 2012-08-08 ENCOUNTER — Telehealth: Payer: Self-pay | Admitting: *Deleted

## 2012-08-08 ENCOUNTER — Other Ambulatory Visit: Payer: Self-pay | Admitting: Internal Medicine

## 2012-08-08 MED ORDER — HYDROCODONE-ACETAMINOPHEN 5-325 MG PO TABS: 1.0000 | ORAL_TABLET | Freq: Four times a day (QID) | ORAL | Status: DC | PRN

## 2012-08-08 NOTE — Telephone Encounter (Signed)
Please see note on the orders.  Victoria Campos

## 2012-08-08 NOTE — Telephone Encounter (Signed)
Rx called in and pt aware

## 2012-08-08 NOTE — Telephone Encounter (Signed)
Pt called stating she come home from hospital this week on 5/27, she was admitted for left arm swelling. She was given pain meds in hospitial for this pain but no pain med at d/c She is c/o arm throbbing at night. She only has soreness during the day.   She elevates the arm. She has tried tylenol with no relief. She is asking for pain med at night.  Pt # [id number]

## 2012-08-11 ENCOUNTER — Ambulatory Visit (INDEPENDENT_AMBULATORY_CARE_PROVIDER_SITE_OTHER): Payer: No Typology Code available for payment source | Admitting: Pharmacist

## 2012-08-11 ENCOUNTER — Encounter: Payer: Self-pay | Admitting: Internal Medicine

## 2012-08-11 DIAGNOSIS — Z7901 Long term (current) use of anticoagulants: Secondary | ICD-10-CM

## 2012-08-11 DIAGNOSIS — I2699 Other pulmonary embolism without acute cor pulmonale: Secondary | ICD-10-CM

## 2012-08-11 NOTE — Progress Notes (Signed)
Anti-Coagulation Progress Note  Victoria Campos is a 62 y.o. female who is currently on an anti-coagulation regimen.    RECENT RESULTS: Recent results are below, the most recent result is correlated with a dose of 63.75 mg. per week: Lab Results  Component Value Date   INR 2.50 08/11/2012   INR 1.08 08/05/2012   INR 1.21 08/04/2012    ANTI-COAG DOSE: Anticoagulation Dose Instructions as of 08/11/2012     Glynis Smiles Tue Wed Thu Fri Sat   New Dose 7.5 mg 11.25 mg 7.5 mg 11.25 mg 7.5 mg 11.25 mg 7.5 mg       ANTICOAG SUMMARY: Anticoagulation Episode Summary   Current INR goal 2.0-3.0  Next INR check 09/08/2012  INR from last check 2.50 (08/11/2012)  Weekly max dose   Target end date   [facility]  Preferred lab   Send INR reminders to ANTICOAG IMP   Indications  Long term (current) use of anticoagulants [V58.61]        Comments Bilateral PE      Anticoagulation Care Providers   Provider Role Specialty Phone number   Nyoka Cowden, MD  Pulmonary Disease [phone]      ANTICOAG TODAY: Anticoagulation Summary as of 08/11/2012   INR goal 2.0-3.0  Selected INR 2.50 (08/11/2012)  Next INR check 09/08/2012  Target end date    Indications  Long term (current) use of anticoagulants [V58.61]      Anticoagulation Episode Summary   [facility]   Preferred lab    Send INR reminders to ANTICOAG IMP   Comments Bilateral PE    Anticoagulation Care Providers   Provider Role Specialty Phone number   Nyoka Cowden, MD  Pulmonary Disease [phone]      PATIENT INSTRUCTIONS: Patient Instructions  Patient instructed to take medications as defined in the Anti-coagulation Track section of this encounter.  Patient instructed to take today's dose.  Patient verbalized understanding of these instructions.       FOLLOW-UP Return in 4 weeks (on 09/08/2012) for Follow up INR at 1030h.  Hulen Luster, III Pharm.D., CACP

## 2012-08-11 NOTE — Patient Instructions (Signed)
Patient instructed to take medications as defined in the Anti-coagulation Track section of this encounter.  Patient instructed to take today's dose.  Patient verbalized understanding of these instructions.    

## 2012-08-14 ENCOUNTER — Ambulatory Visit (INDEPENDENT_AMBULATORY_CARE_PROVIDER_SITE_OTHER): Payer: No Typology Code available for payment source | Admitting: Internal Medicine

## 2012-08-14 ENCOUNTER — Encounter: Payer: Self-pay | Admitting: Internal Medicine

## 2012-08-14 VITALS — BP 133/81 | HR 62 | Temp 98.9°F | Ht 64.5 in | Wt 184.7 lb

## 2012-08-14 DIAGNOSIS — M7989 Other specified soft tissue disorders: Secondary | ICD-10-CM

## 2012-08-14 MED ORDER — ROSUVASTATIN CALCIUM 20 MG PO TABS: 20.0000 mg | ORAL_TABLET | Freq: Every day | ORAL | Status: DC

## 2012-08-14 MED ORDER — MOMETASONE FUROATE 50 MCG/ACT NA SUSP: 2.0000 | Freq: Every day | NASAL | Status: DC

## 2012-08-14 NOTE — Progress Notes (Signed)
  Subjective:    Patient ID: Victoria Campos, female    DOB: 02/13/1951, 62 y.o.   MRN: [id number]  HPI Comments: 62 y.o hospital f/u for left arm swelling associated with supratherapeutic INR now INR 2.50 on 6.2.  F/u appt with Dr. Alexandria Lodge 09/08/12.  Left arm swelling is decreased thought patient still has 7/10 mid forearm flexor surface pain at night.  She denies numbness  Also she reports a dry cough associated with pollen and she has seasonal allergies and requests Nasonex.  She also needs a Rx refill of Crestor.   Otherwise no complaints   SH: patient states her aunt from [place] 64y[place] recently died      Review of Systems  Respiratory: Positive for cough.        Objective:   Physical Exam  Nursing note and vitals reviewed. Constitutional: She is oriented to person, place, and time. Vital signs are normal. She appears well-developed and well-nourished. She is cooperative. No distress.  HENT:  Head: Normocephalic and atraumatic.  Mouth/Throat: Oropharynx is clear and moist and mucous membranes are normal. No oropharyngeal exudate.  Eyes: Conjunctivae are normal. Pupils are equal, round, and reactive to light. Right eye exhibits no discharge. Left eye exhibits no discharge. No scleral icterus.  Cardiovascular: Normal rate, regular rhythm, S1 normal, S2 normal and normal heart sounds.   No murmur heard. Pulmonary/Chest: Effort normal and breath sounds normal. No respiratory distress. She has no wheezes.  Abdominal: Soft. Bowel sounds are normal. There is no tenderness.  Musculoskeletal:       Arms: Neurological: She is alert and oriented to person, place, and time. Gait normal.  Skin: Skin is warm and dry. Bruising and ecchymosis noted. No rash noted. She is not diaphoretic.     Psychiatric: She has a normal mood and affect. Her speech is normal and behavior is normal. Judgment and thought content normal. Cognition and memory are normal.          Assessment & Plan:  F/u  with PCP for chronic medical problems and Dr. Alexandria Lodge 09/08/12  F/u in 3 months for chronic medical conditions with Bosie Clos MD

## 2012-08-14 NOTE — Patient Instructions (Addendum)
Please follow up in 3 months for chronic medical conditions with Dr. Bosie Clos  Read all the information Your medications are at [facility].   Hypertension As your heart beats, it forces blood through your arteries. This force is your blood pressure. If the pressure is too high, it is called hypertension (HTN) or high blood pressure. HTN is dangerous because you may have it and not know it. High blood pressure may mean that your heart has to work harder to pump blood. Your arteries may be narrow or stiff. The extra work puts you at risk for heart disease, stroke, and other problems.  Blood pressure consists of two numbers, a higher number over a lower, 110/72, for example. It is stated as "110 over 72." The ideal is below 120 for the top number (systolic) and under 80 for the bottom (diastolic). Write down your blood pressure today. You should pay close attention to your blood pressure if you have certain conditions such as:  Heart failure.  Prior heart attack.  Diabetes  Chronic kidney disease.  Prior stroke.  Multiple risk factors for heart disease. To see if you have HTN, your blood pressure should be measured while you are seated with your arm held at the level of the heart. It should be measured at least twice. A one-time elevated blood pressure reading (especially in the Emergency Department) does not mean that you need treatment. There may be conditions in which the blood pressure is different between your right and left arms. It is important to see your caregiver soon for a recheck. Most people have essential hypertension which means that there is not a specific cause. This type of high blood pressure may be lowered by changing lifestyle factors such as:  Stress.  Smoking.  Lack of exercise.  Excessive weight.  Drug/tobacco/alcohol use.  Eating less salt. Most people do not have symptoms from high blood pressure until it has caused damage to the body. Effective  treatment can often prevent, delay or reduce that damage. TREATMENT  When a cause has been identified, treatment for high blood pressure is directed at the cause. There are a large number of medications to treat HTN. These fall into several categories, and your caregiver will help you select the medicines that are best for you. Medications may have side effects. You should review side effects with your caregiver. If your blood pressure stays high after you have made lifestyle changes or started on medicines,   Your medication(s) may need to be changed.  Other problems may need to be addressed.  Be certain you understand your prescriptions, and know how and when to take your medicine.  Be sure to follow up with your caregiver within the time frame advised (usually within two weeks) to have your blood pressure rechecked and to review your medications.  If you are taking more than one medicine to lower your blood pressure, make sure you know how and at what times they should be taken. Taking two medicines at the same time can result in blood pressure that is too low. SEEK IMMEDIATE MEDICAL CARE IF:  You develop a severe headache, blurred or changing vision, or confusion.  You have unusual weakness or numbness, or a faint feeling.  You have severe chest or abdominal pain, vomiting, or breathing problems. MAKE SURE YOU:   Understand these instructions.  Will watch your condition.  Will get help right away if you are not doing well or get worse. Document Released: 02/26/2005 Document  Revised: 05/21/2011 Document Reviewed: 10/17/2007 [facility] Patient Information 2014 [place], [place].  Warfarin  Warfarin is a blood thinner (anticoagulant) medicine. It is used to thin the blood so blood clots do not form. This medicine may cause very bad bleeding. You may also bruise easily while on this medicine. You may also bleed a little longer if you cut yourself.  Before taking warfarin, tell your  doctor if:  You take any other medicine for your heart or blood pressure.  You are pregnant.  You plan to get pregnant.  You are breastfeeding.  You are allergic to any medicine. HOME CARE  Have your blood checked as told by your doctor. Do not miss visits. Blood tests will include the PT and INR tests. These tests measure how long it takes for a clot to form in a blood sample. The test results help your doctor change your dose of warfarin if needed. If you miss your blood test visits, you could have bad bleeding or blood clots.  Take warfarin exactly as told by your doctor. If you do not, bleeding or blood clots could lead to lasting injury, pain, or disability.  Take your medicine at the same time every day. Do not miss a dose.  Tell your doctor about all medicine you take. This includes medicines, vitamins, natural products, or dietary pills (supplements). Certain medicines are not safe to take while taking warfarin. Taking other medicines while taking warfarin can affect your PT and INR test results.  Do not start or stop any medicine unless you have been told to do so by your doctor.  Do not take anything that has aspirin in it unless your doctor says it is okay.  Eat the same amount of vitamin K foods every day. Vitamin K foods can change how warfarin works and your PT and INR test results.   High vitamin K foods: Beef liver. Pork liver. Green tea. Broccoli. Brussels sprouts. Cauliflower. Chickpeas. Kale. Spinach. Turnip greens.  Medium vitamin K foods: Chicken liver. Pork tenderloin. Cheddar cheese. Rolled oats. Coffee. Asparagus. Cabbage. Iceberg lettuce.  Low vitamin K foods: Apples. Butter. Bananas. Skim or 1% milk. Canned pears. White bread. Strawberries. Corn. Tomatoes. Green beans. Eggs. Potatoes. Tomasa Blase. Pumpkin. Chicken breasts. Ground beef. Oil (except soybean oil).  Avoid making major changes to your normal diet. Talk to your doctor first before changing your normal  diet.  Do not drink alcohol.  Tell your doctors and dentists that you are taking warfarin at the beginning of every visit. GET HELP RIGHT AWAY IF:  You miss a dose of warfarin. Do not take 2 doses at the same time.  You have a skin rash.  You have heavy or unusual bleeding.  There is blood in your pee (urine) or poop (stool).  You have side effects from medicine that do not get better after a few days. MAKE SURE YOU:  Understand these instructions.  Will watch your condition.  Will get help right away if you are not doing well or get worse. Document Released: 03/31/2010 Document Revised: 08/28/2011 Document Reviewed: 03/31/2010 [facility] Patient Information 2014 [place], [place].  Prothrombin Time, International Normalized Ratio A Prothrombin Time (Protime, PT) measures the time that it takes for your blood to clot. The International Normalized Ratio (INR) is a calculation of blood clotting time based upon your PT result. Most laboratories report both PT and INR values when reporting blood clotting times. The PT is used to determine:  Certain medical conditions that can cause bleeding  disorders. These can include:  Liver disease.  Systemic infection (sepsis).  Inherited (genetic) bleeding disorders.  The appropriate dose of warfarin (Coumadin) needed to treat various conditions that require the use of a blood thinner (anticoagulant). There is no standard warfarin dose. Each person is unique and will require their own warfarin dose based on PT and INR levels. An INR should only be used to determine the appropriate dose of an anticoagulant. It is important to know that:  PT and INR levels are checked per your caregiver's recommendations. It is very important to keep your PT and INR lab appointments. Not keeping the appointments could result in a chronic or permanent injury, pain, and disability. If there is any problem keeping the appointment, you must call your caregiver.  PT  and INR levels can be affected by certain foods you eat. It is important to notify your caregiver if you have a change in your diet.  PT and INR levels can be affected by some medications. It is important when taking new prescriptions or over-the-counter medications that you notify your caregiver.  Different medical conditions may require different PT and INR levels. For example, a mechanical heart valve may require a higher INR level than those being treated for a clot in the leg. PREPARATION FOR TEST A blood sample is obtained by inserting a needle into a vein in the arm. If you are on warfarin therapy, the blood sample should be obtained before taking your daily dose. NORMAL FINDINGS  A normal PT is 10 to 13 seconds.  A normal INR level (without anticoagulant therapy) is 0.9 to 1.1.  INR levels (with anticoagulant therapy) generally range from 2 to 3.5 depending on the medical condition that warfarin is being used to treat.  INR levels greater than 3.5 mean that it is taking too long to form a blood clot. Ranges for normal findings may vary among different laboratories and hospitals. You should always check with your doctor after having lab work or other tests done to discuss the meaning of your test results and whether your values are considered within normal limits. MEANING OF TEST  Your caregiver will go over the test results with you and discuss the importance and meaning of your results, as well as treatment options and the need for additional tests if necessary. OBTAINING THE TEST RESULTS It is your responsibility to obtain your test results. Ask the lab or department performing the test when and how you will get your results. SEEK IMMEDIATE MEDICAL CARE IF:   You develop bleeding from the nose, mouth, or gums.  You vomit or cough up blood.  Your bowel movements are bloody or black in color.  You have a severe headache that does not go away.  You skin bruises easily. Document  Released: 03/31/2004 Document Revised: 05/21/2011 Document Reviewed: 02/08/2008 [facility] Patient Information 2014 [place], [place].

## 2012-08-14 NOTE — Assessment & Plan Note (Signed)
Unclear etiology could have been 2/2 to supratherapeutic INR during hosp. Admission INR now 2.5 Still present significant reduced but not gone  ROM intact  See physical exam findings  If not resolved will refer to orthopedics

## 2012-08-20 NOTE — Progress Notes (Signed)
TEACHING ATTENDING ADDENDUM: I personally saw this patient and discussed this case with Dr. Shirlee Latch at the time of the patient visit. I agree with the HPI, exam findings and have read the documentation provided by the resident,  and I concur with the plan of care. Please see the resident note for details of management.

## 2012-09-08 ENCOUNTER — Ambulatory Visit: Payer: No Typology Code available for payment source

## 2012-09-15 ENCOUNTER — Ambulatory Visit (INDEPENDENT_AMBULATORY_CARE_PROVIDER_SITE_OTHER): Payer: No Typology Code available for payment source | Admitting: Pharmacist

## 2012-09-15 DIAGNOSIS — I2699 Other pulmonary embolism without acute cor pulmonale: Secondary | ICD-10-CM

## 2012-09-15 DIAGNOSIS — Z7901 Long term (current) use of anticoagulants: Secondary | ICD-10-CM

## 2012-09-15 NOTE — Patient Instructions (Signed)
Patient instructed to take medications as defined in the Anti-coagulation Track section of this encounter.  Patient instructed to take today's dose.  Patient verbalized understanding of these instructions.    

## 2012-09-15 NOTE — Progress Notes (Signed)
Anti-Coagulation Progress Note  Victoria Campos is a 62 y.o. female who is currently on an anti-coagulation regimen.    RECENT RESULTS: Recent results are below, the most recent result is correlated with a dose of 63.75 mg. per week: Lab Results  Component Value Date   INR 4.0 09/15/2012   INR 2.50 08/11/2012   INR 1.08 08/05/2012    ANTI-COAG DOSE: Anticoagulation Dose Instructions as of 09/15/2012     Glynis Smiles Tue Wed Thu Fri Sat   New Dose 7.5 mg 7.5 mg 7.5 mg 11.25 mg 7.5 mg 7.5 mg 7.5 mg       ANTICOAG SUMMARY: Anticoagulation Episode Summary   Current INR goal 2.0-3.0  Next INR check 10/06/2012  INR from last check 4.0! (09/15/2012)  Weekly max dose   Target end date   [facility]  Preferred lab   Send INR reminders to ANTICOAG IMP   Indications  Long term (current) use of anticoagulants [V58.61]        Comments Bilateral PE      Anticoagulation Care Providers   Provider Role Specialty Phone number   Nyoka Cowden, MD  Pulmonary Disease [phone]      ANTICOAG TODAY: Anticoagulation Summary as of 09/15/2012   INR goal 2.0-3.0  Selected INR 4.0! (09/15/2012)  Next INR check 10/06/2012  Target end date    Indications  Long term (current) use of anticoagulants [V58.61]      Anticoagulation Episode Summary   [facility]   Preferred lab    Send INR reminders to ANTICOAG IMP   Comments Bilateral PE    Anticoagulation Care Providers   Provider Role Specialty Phone number   Nyoka Cowden, MD  Pulmonary Disease [phone]      PATIENT INSTRUCTIONS: Patient Instructions  Patient instructed to take medications as defined in the Anti-coagulation Track section of this encounter.  Patient instructed to take today's dose.  Patient verbalized understanding of these instructions.       FOLLOW-UP Return in 3 weeks (on 10/06/2012) for Follow up INR at 3PM.  Hulen Luster, III Pharm.D., CACP

## 2012-09-25 ENCOUNTER — Telehealth: Payer: Self-pay | Admitting: Internal Medicine

## 2012-09-25 NOTE — Telephone Encounter (Signed)
Patient called in and stated she has been trying to get her Nasonex and Crestor filled since last week.  Patient states she also called again  On 09/22/2012 with no response.  She has already contacted her pharmacy. Transferred call to the Triage Refill line.

## 2012-09-26 ENCOUNTER — Telehealth: Payer: Self-pay | Admitting: *Deleted

## 2012-09-26 NOTE — Telephone Encounter (Signed)
Pt had called for refills on nasonex and crestor, she had refills at the pharmacy for both, 3 on each, the pharmacist stated she had not called to have them refilled, i ask the pharmacist to go ahead and prepare the refills and i would let the pt know, called the pt and told her she could pick them up in appr 2 hrs- [facility] time frame, she stated she THOUGHT she called the pharmacy for refills and they told her they needed dr's approval to fill. None the less she may pick them up

## 2012-10-06 ENCOUNTER — Ambulatory Visit (INDEPENDENT_AMBULATORY_CARE_PROVIDER_SITE_OTHER): Payer: No Typology Code available for payment source | Admitting: Pharmacist

## 2012-10-06 DIAGNOSIS — Z7901 Long term (current) use of anticoagulants: Secondary | ICD-10-CM

## 2012-10-06 DIAGNOSIS — I2699 Other pulmonary embolism without acute cor pulmonale: Secondary | ICD-10-CM

## 2012-10-06 NOTE — Patient Instructions (Signed)
Patient instructed to take medications as defined in the Anti-coagulation Track section of this encounter.  Patient instructed to take today's dose.  Patient verbalized understanding of these instructions.    

## 2012-10-06 NOTE — Progress Notes (Signed)
Anti-Coagulation Progress Note  Victoria Campos is a 62 y.o. female who is currently on an anti-coagulation regimen.    RECENT RESULTS: Recent results are below, the most recent result is correlated with a dose of 56.25 mg. per week: Lab Results  Component Value Date   INR 2.0 10/06/2012   INR 4.0 09/15/2012   INR 2.50 08/11/2012    ANTI-COAG DOSE: Anticoagulation Dose Instructions as of 10/06/2012     Glynis Smiles Tue Wed Thu Fri Sat   New Dose 7.5 mg 11.25 mg 7.5 mg 7.5 mg 11.25 mg 7.5 mg 7.5 mg       ANTICOAG SUMMARY: Anticoagulation Episode Summary   Current INR goal 2.0-3.0  Next INR check 11/03/2012  INR from last check 2.0 (10/06/2012)  Weekly max dose   Target end date   [facility] check [facility]  Preferred lab   Send INR reminders to ANTICOAG IMP   Indications  Long term (current) use of anticoagulants [V58.61]        Comments Bilateral PE      Anticoagulation Care Providers   Provider Role Specialty Phone number   Nyoka Cowden, MD  Pulmonary Disease [phone]      ANTICOAG TODAY: Anticoagulation Summary as of 10/06/2012   INR goal 2.0-3.0  Selected INR 2.0 (10/06/2012)  Next INR check 11/03/2012  Target end date    Indications  Long term (current) use of anticoagulants [V58.61]      Anticoagulation Episode Summary   [facility] check [facility]   Preferred lab    Send INR reminders to ANTICOAG IMP   Comments Bilateral PE    Anticoagulation Care Providers   Provider Role Specialty Phone number   Nyoka Cowden, MD  Pulmonary Disease [phone]      PATIENT INSTRUCTIONS: Patient Instructions  Patient instructed to take medications as defined in the Anti-coagulation Track section of this encounter.  Patient instructed to take today's dose.  Patient verbalized understanding of these instructions.       FOLLOW-UP Return in 4 weeks (on 11/03/2012) for Follow up INR at 3:45PM.  Hulen Luster, III Pharm.D., CACP

## 2012-10-20 ENCOUNTER — Ambulatory Visit (INDEPENDENT_AMBULATORY_CARE_PROVIDER_SITE_OTHER): Payer: No Typology Code available for payment source | Admitting: Internal Medicine

## 2012-10-20 ENCOUNTER — Encounter: Payer: Self-pay | Admitting: Internal Medicine

## 2012-10-20 VITALS — BP 184/96 | HR 80 | Temp 97.4°F | Ht 64.75 in | Wt 185.6 lb

## 2012-10-20 DIAGNOSIS — Z0000 Encounter for general adult medical examination without abnormal findings: Secondary | ICD-10-CM

## 2012-10-20 DIAGNOSIS — R5381 Other malaise: Secondary | ICD-10-CM | POA: Insufficient documentation

## 2012-10-20 DIAGNOSIS — E785 Hyperlipidemia, unspecified: Secondary | ICD-10-CM

## 2012-10-20 DIAGNOSIS — Z23 Encounter for immunization: Secondary | ICD-10-CM

## 2012-10-20 DIAGNOSIS — R5383 Other fatigue: Secondary | ICD-10-CM

## 2012-10-20 DIAGNOSIS — J301 Allergic rhinitis due to pollen: Secondary | ICD-10-CM

## 2012-10-20 DIAGNOSIS — R209 Unspecified disturbances of skin sensation: Secondary | ICD-10-CM

## 2012-10-20 DIAGNOSIS — R200 Anesthesia of skin: Secondary | ICD-10-CM | POA: Insufficient documentation

## 2012-10-20 DIAGNOSIS — I2699 Other pulmonary embolism without acute cor pulmonale: Secondary | ICD-10-CM

## 2012-10-20 DIAGNOSIS — Z7901 Long term (current) use of anticoagulants: Secondary | ICD-10-CM

## 2012-10-20 DIAGNOSIS — I10 Essential (primary) hypertension: Secondary | ICD-10-CM

## 2012-10-20 LAB — CBC
HCT: 37.5 % (ref 36.0–46.0)
MCV: 85.2 fL (ref 78.0–100.0)
RBC: 4.4 MIL/uL (ref 3.87–5.11)
WBC: 4.1 10*3/uL (ref 4.0–10.5)

## 2012-10-20 MED ORDER — ZOSTER VACCINE LIVE 19400 UNT/0.65ML ~~LOC~~ SOLR: 0.6500 mL | Freq: Once | SUBCUTANEOUS | Status: DC

## 2012-10-20 MED ORDER — ROSUVASTATIN CALCIUM 20 MG PO TABS: 20.0000 mg | ORAL_TABLET | Freq: Every day | ORAL | Status: DC

## 2012-10-20 MED ORDER — MOMETASONE FUROATE 50 MCG/ACT NA SUSP: 2.0000 | Freq: Every day | NASAL | Status: DC

## 2012-10-20 NOTE — Assessment & Plan Note (Signed)
Will refer to Dr. Cyndie Chime as advised by Dr. Sherene Sires in regards to further Coumadin therapy.

## 2012-10-20 NOTE — Progress Notes (Signed)
  Subjective:    Patient ID: Victoria Campos, female    DOB: Jun 19, 1950, 62 y.o.   MRN: [id number]  HPI  Hx significant for hypertension, bilateral pulmonary embolism, mild anemia (baseline Hgb ~11) and left arm numbness and swelling in the setting of supra-therapeutic INR. Reports some continued mild numbness of her left medial arm.  She is due for Mammogram, Tetanus and Shingles vaccine.  Pap smear is due in 2015.  She was followed by Dr. Sherene Sires of pulmonology for PE who recommend evaluation by Hematology concerning duration of Coumadin therapy. Complains of fatigue and stress while caring for her small grandchildren.   Review of Systems  Constitutional: Positive for fatigue. Negative for fever, appetite change and unexpected weight change.  HENT: Negative for congestion.   Eyes: Negative for visual disturbance.  Respiratory: Negative for chest tightness and shortness of breath.   Cardiovascular: Negative for chest pain and leg swelling.  Gastrointestinal: Negative.  Negative for nausea, vomiting, abdominal pain, diarrhea, constipation, blood in stool, abdominal distention and anal bleeding.  Genitourinary: Negative for dysuria and hematuria.  Musculoskeletal: Negative for myalgias and arthralgias.  Skin: Negative for color change, pallor, rash and wound.  Neurological: Positive for weakness and numbness. Negative for light-headedness and headaches.       Numbness of left forearm  Hematological: Does not bruise/bleed easily.  Psychiatric/Behavioral: Negative for dysphoric mood.       Objective:   Physical Exam  Constitutional: She is oriented to person, place, and time. She appears well-developed and well-nourished. No distress.  HENT:  Head: Normocephalic and atraumatic.  Eyes: Conjunctivae and EOM are normal. Pupils are equal, round, and reactive to light. No scleral icterus.  Neck: Normal range of motion. Neck supple. No thyromegaly present.  Cardiovascular: Normal rate, regular  rhythm, normal heart sounds and intact distal pulses.   Pulmonary/Chest: Effort normal and breath sounds normal. She has no wheezes. She has no rales.  Abdominal: Soft. Bowel sounds are normal. She exhibits no distension and no mass. There is no tenderness.  Musculoskeletal: Normal range of motion. She exhibits no edema.  Neurological: She is alert and oriented to person, place, and time.  Skin: Skin is warm and dry.  Psychiatric: She has a normal mood and affect.          Assessment & Plan:  See detailed problem-oriented charting 1. H/o PE: no further f/u with Pulmonology, INR followed by Dr. Alexandria Lodge -referral to Dr. Cyndie Chime re: discontinuation of Coumadin therapy  2. H/o anemia: not on Fe therapy -check CBC  3. Fatigue: in setting of family stressors -check CBC and TSH  4. Hypertension: elevated today on recheck as well. Normally at goal. Will have pt return for recheck without grandkids with 1 week

## 2012-10-20 NOTE — Patient Instructions (Addendum)
General Instructions: Return to clinic in 5-7 days on a day when you are not under stress with the grandchildren for a blood pressure recheck. We have made the referral to the Blood Specialist (Hematologist) for guidance about your Coumadin therapy as Dr. Sherene Sires requested. You can go to the pharmacy for the Shingles vaccine. We will give you the Tetanus shot today. Someone will call to schedule the Mammogram.   Treatment Goals:  Goals (1 Years of Data) as of 10/20/12         As of Today 08/14/12 08/05/12 08/05/12 08/05/12     Blood Pressure    . Blood Pressure < 140/90  178/89 133/81 124/81 154/80 150/82      Progress Toward Treatment Goals:  Treatment Goal 10/20/2012  Blood pressure deteriorated  Prevent falls deteriorated    Self Care Goals & Plans:  Self Care Goal 10/20/2012  Manage my medications take my medicines as prescribed; bring my medications to every visit; refill my medications on time; follow the sick day instructions if I am sick  Monitor my health keep track of my blood pressure; keep track of my weight  Eat healthy foods eat more vegetables; eat fruit for snacks and desserts; eat baked foods instead of fried foods; eat foods that are low in salt; drink diet soda or water instead of juice or soda  Be physically active take a walk every day; find an activity I enjoy       Care Management & Community Referrals:

## 2012-10-20 NOTE — Assessment & Plan Note (Addendum)
BP Readings from Last 3 Encounters:  10/20/12 178/89  08/14/12 133/81  08/05/12 124/81    Lab Results  Component Value Date   NA 139 08/03/2012   K 3.9 08/03/2012   CREATININE 1.07 08/03/2012    Assessment: Blood pressure control: moderately elevated Progress toward BP goal:  deteriorated Comments: pt states that she is stressed with caring for grandchildren  Plan: Medications:  continue current medications Educational resources provided: brochure;handout;video Self management tools provided: instructions for home blood pressure monitoring Other plans: defer changes given hx of adequate bp control on current therapy, will return for bp recheck in 5-7 days

## 2012-10-21 ENCOUNTER — Telehealth: Payer: Self-pay | Admitting: Oncology

## 2012-10-21 NOTE — Progress Notes (Signed)
Case discussed with Dr. Schooler soon after the resident saw the patient.  We reviewed the resident's history and exam and pertinent patient test results.  I agree with the assessment, diagnosis, and plan of care documented in the resident's note. 

## 2012-10-21 NOTE — Telephone Encounter (Signed)
LVOM FOR PT TO RETURN CALL IN RE TO REFERRAL.  °

## 2012-10-30 ENCOUNTER — Telehealth: Payer: Self-pay | Admitting: Oncology

## 2012-10-30 NOTE — Telephone Encounter (Signed)
2ND. LVOM FOR PT TO RETURN CALL IN RE TO REFERRAL.  °

## 2012-11-03 ENCOUNTER — Ambulatory Visit: Payer: No Typology Code available for payment source

## 2012-11-04 ENCOUNTER — Telehealth: Payer: Self-pay | Admitting: Oncology

## 2012-11-04 ENCOUNTER — Encounter: Payer: Self-pay | Admitting: Internal Medicine

## 2012-11-04 NOTE — Telephone Encounter (Signed)
S/W PT IN RE TO NP APPT 09/17 @ 9:30 W/DR. GRANFORTUNA REFERRING DR. Clydie Braun [employer] DX- PE, LONG TERM USE OF ANTICOAGULANTS WELCOME PACKET MAILED

## 2012-11-04 NOTE — Telephone Encounter (Signed)
C/D 11/04/12 for appt. 11/26/12 °

## 2012-11-06 ENCOUNTER — Ambulatory Visit (HOSPITAL_COMMUNITY): Payer: No Typology Code available for payment source

## 2012-11-07 ENCOUNTER — Other Ambulatory Visit: Payer: Self-pay | Admitting: Oncology

## 2012-11-07 DIAGNOSIS — I2699 Other pulmonary embolism without acute cor pulmonale: Secondary | ICD-10-CM

## 2012-11-07 DIAGNOSIS — Z7901 Long term (current) use of anticoagulants: Secondary | ICD-10-CM

## 2012-11-10 ENCOUNTER — Telehealth: Payer: Self-pay | Admitting: Oncology

## 2012-11-10 NOTE — Telephone Encounter (Signed)
MESSAGE TO TIFF RE NEW PT LB PER 8/29 POF

## 2012-11-11 ENCOUNTER — Telehealth: Payer: Self-pay | Admitting: Oncology

## 2012-11-11 ENCOUNTER — Other Ambulatory Visit: Payer: No Typology Code available for payment source | Admitting: Lab

## 2012-11-11 NOTE — Telephone Encounter (Signed)
S/W PT LAB APPT 09/08 @ 12:30 PT CONFIRMED.

## 2012-11-17 ENCOUNTER — Ambulatory Visit: Payer: No Typology Code available for payment source

## 2012-11-17 ENCOUNTER — Ambulatory Visit: Payer: No Typology Code available for payment source | Admitting: Internal Medicine

## 2012-11-19 ENCOUNTER — Ambulatory Visit (HOSPITAL_COMMUNITY): Payer: No Typology Code available for payment source

## 2012-11-24 ENCOUNTER — Ambulatory Visit (INDEPENDENT_AMBULATORY_CARE_PROVIDER_SITE_OTHER): Payer: No Typology Code available for payment source | Admitting: Pharmacist

## 2012-11-24 DIAGNOSIS — Z7901 Long term (current) use of anticoagulants: Secondary | ICD-10-CM

## 2012-11-24 LAB — POCT INR: INR: 3.4

## 2012-11-24 NOTE — Progress Notes (Signed)
Anti-Coagulation Progress Note  Victoria Campos is a 62 y.o. female who is currently on an anti-coagulation regimen.    RECENT RESULTS: Recent results are below, the most recent result is correlated with a dose of 60 mg. per week:  Patient advised that if after her hematologist appointment later this week--if indicated to discontinue warfarin, to please let me know. Similarly, if instructed to continue warfarin--to let me know. She was provided my cell phone number for this purpose.  Lab Results  Component Value Date   INR 3.4 11/24/2012   INR 2.0 10/06/2012   INR 4.0 09/15/2012    ANTI-COAG DOSE: Anticoagulation Dose Instructions as of 11/24/2012     Glynis Smiles Tue Wed Thu Fri Sat   New Dose 7.5 mg 7.5 mg 7.5 mg 11.25 mg 7.5 mg 7.5 mg 7.5 mg       ANTICOAG SUMMARY: Anticoagulation Episode Summary   Current INR goal 2.0-3.0  Next INR check 12/22/2012  INR from last check 3.4! (11/24/2012)  Weekly max dose   Target end date   [facility]  Preferred lab   Send INR reminders to ANTICOAG IMP   Indications  Long term (current) use of anticoagulants [V58.61]        Comments Bilateral PE      Anticoagulation Care Providers   Provider Role Specialty Phone number   Nyoka Cowden, MD  Pulmonary Disease [phone]      ANTICOAG TODAY: Anticoagulation Summary as of 11/24/2012   INR goal 2.0-3.0  Selected INR 3.4! (11/24/2012)  Next INR check 12/22/2012  Target end date    Indications  Long term (current) use of anticoagulants [V58.61]      Anticoagulation Episode Summary   [facility] check [facility]   Preferred lab    Send INR reminders to ANTICOAG IMP   Comments Bilateral PE    Anticoagulation Care Providers   Provider Role Specialty Phone number   Nyoka Cowden, MD  Pulmonary Disease [phone]      PATIENT INSTRUCTIONS: Patient Instructions  Patient instructed to take medications as defined in the Anti-coagulation Track section of this  encounter.  Patient instructed to take today's dose.  Patient verbalized understanding of these instructions.  Patient instructed that after this week's visit with her hematologist--should they advise to discontinue warfarin, to let me know. Similarly, if they advise to continue upon warfarin--let me know. Call me on my cell phone and leave voice message at [phone].     FOLLOW-UP Return in 4 weeks (on 12/22/2012) for Follow up INR at 1130h.  Hulen Luster, III Pharm.D., CACP

## 2012-11-24 NOTE — Patient Instructions (Signed)
Patient instructed to take medications as defined in the Anti-coagulation Track section of this encounter.  Patient instructed to take today's dose.  Patient verbalized understanding of these instructions.  Patient instructed that after this week's visit with her hematologist--should they advise to discontinue warfarin, to let me know. Similarly, if they advise to continue upon warfarin--let me know. Call me on my cell phone and leave voice message at [phone].

## 2012-11-26 ENCOUNTER — Ambulatory Visit (HOSPITAL_BASED_OUTPATIENT_CLINIC_OR_DEPARTMENT_OTHER): Payer: No Typology Code available for payment source | Admitting: Oncology

## 2012-11-26 ENCOUNTER — Ambulatory Visit: Payer: No Typology Code available for payment source

## 2012-11-26 ENCOUNTER — Ambulatory Visit (HOSPITAL_BASED_OUTPATIENT_CLINIC_OR_DEPARTMENT_OTHER): Payer: No Typology Code available for payment source | Admitting: Lab

## 2012-11-26 ENCOUNTER — Encounter: Payer: Self-pay | Admitting: Oncology

## 2012-11-26 ENCOUNTER — Telehealth: Payer: Self-pay | Admitting: Oncology

## 2012-11-26 VITALS — BP 155/96 | HR 85 | Temp 97.9°F | Resp 20 | Ht 64.75 in | Wt 183.4 lb

## 2012-11-26 DIAGNOSIS — I2699 Other pulmonary embolism without acute cor pulmonale: Secondary | ICD-10-CM

## 2012-11-26 DIAGNOSIS — D649 Anemia, unspecified: Secondary | ICD-10-CM

## 2012-11-26 DIAGNOSIS — Z7901 Long term (current) use of anticoagulants: Secondary | ICD-10-CM

## 2012-11-26 LAB — IRON AND TIBC CHCC
Iron: 81 ug/dL (ref 41–142)
TIBC: 279 ug/dL (ref 236–444)

## 2012-11-26 LAB — MORPHOLOGY

## 2012-11-26 LAB — CBC & DIFF AND RETIC
BASO%: 0.7 % (ref 0.0–2.0)
HCT: 37 % (ref 34.8–46.6)
LYMPH%: 21.6 % (ref 14.0–49.7)
MCHC: 34.6 g/dL (ref 31.5–36.0)
MCV: 83.9 fL (ref 79.5–101.0)
MONO#: 0.2 10*3/uL (ref 0.1–0.9)
MONO%: 6.9 % (ref 0.0–14.0)
NEUT%: 64.9 % (ref 38.4–76.8)
Platelets: 220 10*3/uL (ref 145–400)
RBC: 4.41 10*6/uL (ref 3.70–5.45)
WBC: 3.1 10*3/uL — ABNORMAL LOW (ref 3.9–10.3)

## 2012-11-26 LAB — CHCC SMEAR

## 2012-11-26 LAB — FERRITIN CHCC: Ferritin: 88 ng/ml (ref 9–269)

## 2012-11-26 LAB — PROTIME-INR
INR: 2.6 (ref 2.00–3.50)
Protime: 31.2 Seconds — ABNORMAL HIGH (ref 10.6–13.4)

## 2012-11-26 NOTE — Progress Notes (Signed)
Checked in new patient with no financial issues. Mail and phone for communication. °

## 2012-11-26 NOTE — Telephone Encounter (Signed)
Gave pt appt for Msd and sent pt to lab today

## 2012-11-26 NOTE — Progress Notes (Signed)
New Patient Hematology-Oncology Evaluation   Victoria Campos [id number] 11/25/1950 62 y.o. 11/26/2012  CC: Dr. Margarito Liner; Dr. Juleen Starr   Reason for referral: Advise on duration of anticoagulation   HPI:  New patient evaluation for this 62 year old woman who presented with pleuritic chest pain and dyspnea in July of 2013. CT angiography on 09/23/2011 showed small, segmental, bilateral, upper and lower lobe, pulmonary emboli. Venous Doppler studies of the legs done the same day did not show a clot in either leg. She has been on chronic Coumadin anticoagulation since that time. Recently she has had some intermittent right pleuritic chest discomfort. She was evaluated on 08/03/2012 for swelling and bruising of her right wrist. A. upper extremity venous Doppler study showed no evidence of deep or superficial clot. She doesn't have any major personal risk factors for clotting. She is hypertensive on treatment. She is not overweight. She does not smoke. She was not taking any estrogens at the time of the initial event. She has no signs or symptoms of a collagen vascular disorder. She states she had a colonoscopy one year ago and was told it was normal. There is no family history of blood clots in her parents, 4 sisters, a brother, are her 2 daughters. There is a strong family history of cancer. One sister died at age 62 cancer type unknown. One sister has been treated for breast and uterine cancer. One sister has been treated for colon cancer. Her brother is about to have surgery for prostate cancer. He is 64 years old.  Partial hypercoagulation profile done at time of her pulmonary embolus reveals normal protein S, C., antithrombin, negative anticardiolipin antibodies, negative antibodies against beta-2 glycoprotein 1, negative factor V Leiden gene mutation. Positive lupus-type anticoagulant which I suspect is spurious likely done when she was getting Lovenox injections. Prothrombin gene mutation not  done.   PMH: Past Medical History  Diagnosis Date  . Hypertension   . Hypercholesteremia   . Heart murmur   . Insomnia   . PE (pulmonary embolism)     Past Surgical History  Procedure Laterality Date  . [place]      removed  . Tubal ligation    . Arm surgery      torn ligamint    Allergies: Allergies  Allergen Reactions  . Sulfamethoxazole W-Trimethoprim Rash    REACTION: red rash    Medications Coumadin 7.5 mg daily except 11.2 mg on Wednesdays. Aspirin 81 mg daily, Norco 5/325 every 6 hours when necessary pain, Lopressor 50 mg daily, myocarditis HCT 80-25 one daily, trazodone 50 mg at bedtime when necessary sleep, Crestor 20 mg daily, Nasonex 50 mcg nasal spray 2 sprays in the nose daily   Social History: She has 2 healthy daughters age 49 image 77 and an adopted nephew who she raised age 48  reports that she quit smoking about 44 years ago. she drinks about 0.6 ounces of alcohol per week.  Family History: Family History  Problem Relation Age of Onset  . Cancer Brother     colon  . Cancer Sister     colon  . Cancer Sister     breast  . Heart attack Father   . Heart disease Father     Review of Systems: Constitutional symptoms: No fever, anorexia, or weight loss HEENT: No sore throat. Respiratory: See above. No dyspnea. Some intermittent right sided lower chest discomfort Cardiovascular:  No ischemic type chest pain, chest pressure, or palpitations Gastrointestinal ROS: No change in  bowel habit. Genito-Urinary ROS: No urinary tract symptoms. No vaginal bleeding Hematological and Lymphatic: Musculoskeletal: No significant muscle, bone, or joint pain Neurologic: No headache or change in vision Dermatologic: No rash or ecchymosis Remaining ROS negative.  Physical Exam: Blood pressure 155/96, pulse 85, temperature 97.9 F (36.6 C), temperature source Oral, resp. rate 20, height 5' 4.75" (1.645 m), weight 183 lb 6.4 oz (83.19 kg). Wt Readings from Last 3  Encounters:  11/26/12 183 lb 6.4 oz (83.19 kg)  10/20/12 185 lb 9.6 oz (84.188 kg)  08/14/12 184 lb 11.2 oz (83.779 kg)    General appearance: Well-nourished African American woman HENNT: Pharynx no erythema, exudate, ulcer, or mass. No thyromegaly or thyroid nodules. Lymph nodes: No cervical, supraclavicular, or axillary adenopathy Breasts: Not examined. Last mammogram 09/06/2010 was normal. Lungs: Clear to auscultation. Resonant to percussion Heart: Regular rhythm. No murmur gallop or rub. No peripheral edema. Vascular: No carotid bruits. Carotid pulses 2+. No cyanosis. Dorsalis pedis pulses 2+ symmetric Abdominal: Soft, nontender, no mass, no organomegaly GU: Extremities: No edema, no calf tenderness Neurologic: Mental status intact, PERRLA, optic disc sharp on the left not well visualized on the right due to early cataract, motor strength 5 over 5, reflexes 1+ symmetric, sensation intact to vibration over the fingertips by tuning fork exam Skin: No rash or ecchymosis Musculoskeletal: No joint deformities    Lab Results: Lab Results  Component Value Date   WBC 4.1 10/20/2012   HGB 12.7 10/20/2012   HCT 37.5 10/20/2012   MCV 85.2 10/20/2012   PLT 240 10/20/2012     Chemistry      Component Value Date/Time   NA 139 08/03/2012 0151   K 3.9 08/03/2012 0151   CL 107 08/03/2012 0151   CO2 27 08/03/2012 0151   BUN 11 08/03/2012 0151   CREATININE 1.07 08/03/2012 0151      Component Value Date/Time   CALCIUM 9.1 08/03/2012 0151   ALKPHOS 54 08/03/2012 0151   AST 24 08/03/2012 0151   ALT 25 08/03/2012 0151   BILITOT 0.3 08/03/2012 0151         Review of peripheral blood film:   Radiological Studies: See discussion above     Impression and Plan: 14 months status post unprovoked bilateral pulmonary emboli. No obvious personal or inherited risk factors for clotting. Chronic normochromic anemia. Normal colonoscopy by patient history but no documentation in current chart  record. Partial hypercoagulation evaluation unrevealing in the past except for a positive lupus-type anticoagulant which may be a spurious finding done at time of the acute clot when she was likely receiving heparin products.   She has no obvious clinical or physical findings to suggest an underlying malignancy. It appeasr she has had at least a partial workup of her anemia in the past but I cannot find the documentation. I will go ahead and get a serum protein electrophoresis with immunoelectrophoresis today and an iron panel.  Decisions on duration of anticoagulation have to be individualized. Clotting risk needs to be balanced against bleeding risk. Overall, risk of recurrent clotting goes down significantly in the first year after the initial event. However, risk never decreases to 0. Bleeding risk increases over time approximately 1% per year on warfarin.  Recommendation: I'm going to check her for the presence of the prothrombin gene mutation and also see if the lupus anticoagulant which was positive in the past is reproducible. Given negative anticardiolipin antibodies and beta 2 glycoprotein 1 antibodies my guess is that the initial  result was spurious. If in fact, she does test positive for the persistence of a lupus-type anticoagulant, recommendation would be long-term anticoagulation. If she tests negative, then I would recommend stopping her Coumadin and just keeping her on the aspirin.          Levert Feinstein, MD 11/26/2012, 10:56 AM

## 2012-11-28 ENCOUNTER — Ambulatory Visit (HOSPITAL_COMMUNITY)
Admission: RE | Admit: 2012-11-28 | Discharge: 2012-11-28 | Disposition: A | Discharge status: Home / Self Care | Payer: No Typology Code available for payment source | Source: Ambulatory Visit | Attending: Internal Medicine | Admitting: Internal Medicine

## 2012-11-28 ENCOUNTER — Telehealth: Payer: Self-pay | Admitting: *Deleted

## 2012-11-28 DIAGNOSIS — Z1231 Encounter for screening mammogram for malignant neoplasm of breast: Secondary | ICD-10-CM | POA: Insufficient documentation

## 2012-11-28 DIAGNOSIS — Z0000 Encounter for general adult medical examination without abnormal findings: Secondary | ICD-10-CM

## 2012-11-28 LAB — LUPUS ANTICOAGULANT PANEL
Lupus Anticoagulant: NOT DETECTED
PTTLA Confirmation: 3.7 secs (ref <8.0)

## 2012-11-28 LAB — IMMUNOFIXATION ELECTROPHORESIS
IgA: 156 mg/dL (ref 69–380)
IgM, Serum: 140 mg/dL (ref 52–322)

## 2012-11-28 LAB — PROTHROMBIN GENE MUTATION

## 2012-11-28 NOTE — Telephone Encounter (Signed)
Pt called for "test results."  Transferred her to dr. Patsy Lager nurse and instructed her to leave VM.

## 2012-11-28 NOTE — Telephone Encounter (Signed)
Spoke with patient.  Let her know that special clotting studies all came back normal.  She appreciated the phone call.

## 2012-11-28 NOTE — Telephone Encounter (Signed)
Message copied by Orbie Hurst on Fri Nov 28, 2012  4:26 PM ------      Message from: Levert Feinstein      Created: Thu Nov 27, 2012  5:38 PM       Call pt: special clotting studies all came back normal ------

## 2012-12-22 ENCOUNTER — Ambulatory Visit (INDEPENDENT_AMBULATORY_CARE_PROVIDER_SITE_OTHER): Payer: No Typology Code available for payment source | Admitting: Internal Medicine

## 2012-12-22 ENCOUNTER — Ambulatory Visit: Payer: No Typology Code available for payment source

## 2012-12-22 ENCOUNTER — Encounter: Payer: Self-pay | Admitting: Internal Medicine

## 2012-12-22 VITALS — BP 155/89 | HR 92 | Temp 97.2°F | Ht 64.5 in | Wt 183.6 lb

## 2012-12-22 DIAGNOSIS — Z7901 Long term (current) use of anticoagulants: Secondary | ICD-10-CM

## 2012-12-22 DIAGNOSIS — Z23 Encounter for immunization: Secondary | ICD-10-CM

## 2012-12-22 DIAGNOSIS — I2699 Other pulmonary embolism without acute cor pulmonale: Secondary | ICD-10-CM

## 2012-12-22 DIAGNOSIS — E785 Hyperlipidemia, unspecified: Secondary | ICD-10-CM

## 2012-12-22 DIAGNOSIS — I10 Essential (primary) hypertension: Secondary | ICD-10-CM

## 2012-12-22 DIAGNOSIS — J301 Allergic rhinitis due to pollen: Secondary | ICD-10-CM

## 2012-12-22 MED ORDER — TRIAMCINOLONE ACETONIDE(NASAL) 55 MCG/ACT NA INHA: 2.0000 | Freq: Every day | NASAL | Status: DC

## 2012-12-22 MED ORDER — AMLODIPINE BESYLATE 5 MG PO TABS: 5.0000 mg | ORAL_TABLET | Freq: Every day | ORAL | Status: DC

## 2012-12-22 MED ORDER — PRAVASTATIN SODIUM 40 MG PO TABS: 40.0000 mg | ORAL_TABLET | Freq: Every day | ORAL | Status: DC

## 2012-12-22 NOTE — Assessment & Plan Note (Signed)
Check lipid panel today Change to pravastatin 40 mg.

## 2012-12-22 NOTE — Assessment & Plan Note (Addendum)
BP Readings from Last 3 Encounters:  12/22/12 155/89  11/26/12 155/96  10/20/12 184/96    Lab Results  Component Value Date   NA 139 08/03/2012   K 3.9 08/03/2012   CREATININE 1.07 08/03/2012    Assessment: Blood pressure control: moderately elevated Progress toward BP goal:  unchanged Comments: has been on same dosage for over a year with prior control.  No dose change, no change the time of day that she takes it. Pt states she had been on twice a day metoprolol some time ago but was decreased to once a day.  Plan: Medications:  continue current medications Educational resources provided: brochure;handout;video Self management tools provided:   Other plans: will increase metoprolol to twice a day, will add amlodipine 5 mg qd if still elevated at return recheck in 2 weeks

## 2012-12-22 NOTE — Patient Instructions (Signed)
General Instructions: You received the flu shot today. Your blood work from the hematologist returned normal for bleeding abnormalities. I have changed your cholesterol medicine to Pravastatin. We will check your cholesterol today. I have added another blood pressure medication called amlodipine (Norvasc) Return to see me in 2 weeks to recheck your blood pressure.  Treatment Goals:  Goals (1 Years of Data) as of 12/22/12         As of Today 11/26/12 10/20/12 10/20/12 08/14/12     Blood Pressure    . Blood Pressure < 140/90  155/89 155/96 184/96 178/89 133/81      Progress Toward Treatment Goals:  Treatment Goal 12/22/2012  Blood pressure unchanged  Prevent falls -    Self Care Goals & Plans:  Self Care Goal 12/22/2012  Manage my medications take my medicines as prescribed; bring my medications to every visit; refill my medications on time; follow the sick day instructions if I am sick  Monitor my health keep track of my blood pressure; keep track of my weight  Eat healthy foods eat more vegetables; eat fruit for snacks and desserts; eat foods that are low in salt; eat baked foods instead of fried foods; drink diet soda or water instead of juice or soda  Be physically active find an activity I enjoy; take a walk every day    No flowsheet data found.   Care Management & Community Referrals:  No flowsheet data found.

## 2012-12-22 NOTE — Assessment & Plan Note (Signed)
Will change from nasonex to nasacort for insurance coverage

## 2012-12-22 NOTE — Assessment & Plan Note (Signed)
INR therapeutic 2.6, will f/u with Dr. Cyndie Chime tomorrow 10/14 in regards to length of Coumadin therapy.

## 2012-12-22 NOTE — Progress Notes (Signed)
  Subjective:    Patient ID: Victoria Campos, female    DOB: December 14, 1950, 62 y.o.   MRN: [id number]  HPI  Victoria Campos is a 62 yo female with PMHx significant for hypertension and bilateral segmental pulmonary embolism in setting of positive lupus anticoagulant managed on Coumadin and being evaluated by Hematology for possible clotting disorder. She returns to clinic for blood pressure recheck after several elevated readings.  She has had no changes in therapy and pt reports compliance with telmisartan-hct 80-25 mg qd combo and metoprolol 50 mg qd.  Today bp still elevated 155/89 pulse 92 bpm.  Of note, she changed insurance several months ago and they are now requiring pre-authorization for her Crestor and Nasonex thus she has not been taking her statin for months due to cost.  Currently she is without complaints specifically no chest pain, shortness of breath, headaches and states that her stress has been less since she has managed to cope with the death of her brother.   Review of Systems  Constitutional: Negative for fever and fatigue.  HENT: Negative for nosebleeds.   Eyes: Negative for visual disturbance.  Respiratory: Negative for shortness of breath.   Cardiovascular: Negative for chest pain, palpitations and leg swelling.  Gastrointestinal: Negative for blood in stool.  Genitourinary: Negative for dysuria and vaginal bleeding.  Skin: Negative for rash.  Neurological: Negative for weakness and headaches.  Hematological: Does not bruise/bleed easily.       Objective:   Physical Exam  Constitutional: She is oriented to person, place, and time. She appears well-developed and well-nourished. No distress.  HENT:  Head: Normocephalic and atraumatic.  Eyes: Conjunctivae are normal. Pupils are equal, round, and reactive to light.  Neck: Normal range of motion. Neck supple.  Cardiovascular: Normal rate, regular rhythm, normal heart sounds and intact distal pulses.   Pulmonary/Chest: Effort  normal and breath sounds normal. She has no wheezes.  Abdominal: Soft. Bowel sounds are normal. There is no tenderness.  Musculoskeletal: Normal range of motion. She exhibits no edema.  Neurological: She is alert and oriented to person, place, and time.  Skin: Skin is warm.  Psychiatric: She has a normal mood and affect.          Assessment & Plan:   See problem list charting:  1. Hypertension: still above goal on ARB-HCT combo and BB (telmisartan-hct 80-25 qd and metoprolol 50 mg qd).  She is only taking the metoprolol once a day and states that it had been decreased from twice a day when she was at [facility]. -will increase metoprolol to 50 mg bid given her heart rate of 92 bpm, if still elevated at next visit will add amlodipine 5 mg qd -recheck in 2 weeks  2. H/o PE bilateral: with positive lupus anticoag, INR therapeutic at 2.6 -f/u by Dr. Cyndie Campos, thus far clotting studies normal -usual PE tx ~ 6 months -cont Coumadin for now as she will f/u tomorrow 12/23/2012 with Hematologist (Victoria Campos)  3. Allergic rhinitis: pt states insurance wont pay for Nasonex -will try alternative ieg Nasocort  4. Hyperlipidemia: insurance wont pay for Crestor -will try pravasatin -check lipid panel---> order placed but pt prefers to have all labs drawn tomorrow at hematologist appt  5. Preventative Care: flu shot today

## 2012-12-23 ENCOUNTER — Ambulatory Visit: Payer: No Typology Code available for payment source | Admitting: Oncology

## 2012-12-23 ENCOUNTER — Encounter: Payer: Self-pay | Admitting: Oncology

## 2012-12-23 MED ORDER — METOPROLOL TARTRATE 50 MG PO TABS: 50.0000 mg | ORAL_TABLET | Freq: Two times a day (BID) | ORAL | Status: DC

## 2012-12-23 NOTE — Progress Notes (Signed)
62 year old woman I recently evaluated here in the office in September with a question of duration of anticoagulation at 14 months from unprovoked bilateral pulmonary emboli with no obvious source in the legs. Partial hypercoagulation profile was unremarkable. She had an initial positive lupus-type anticoagulant which I felt was spurious and was able to confirm this by repeating it off anticoagulants on September 17 and it returned negative. A prothrombin gene mutation was not done in the past. This was done on September 17 as well and was not detected. Serum immunoglobulins were normal and there were no monoclonal proteins on IFE. D.-dimer normalized at 0.27. My advice at time of the consultation was that of all of the outstanding lab parameters were normal, I would stop the full dose anticoagulation and just keep her on 81 mg of aspirin daily. We called her with the results of these studies. She did not report for the visit today but does not need to be rescheduled unless there are problems in the future.

## 2012-12-25 NOTE — Progress Notes (Signed)
Case discussed with Dr. Bosie Clos soon after the resident saw the patient.  We reviewed the resident's history and exam and pertinent patient test results.  I agree with the assessment, diagnosis, and plan of care documented in the resident's note. If patient truly with lupus anticoagulant, anticoagulation may be lifelong.

## 2012-12-26 ENCOUNTER — Ambulatory Visit (HOSPITAL_BASED_OUTPATIENT_CLINIC_OR_DEPARTMENT_OTHER): Payer: No Typology Code available for payment source | Admitting: Nurse Practitioner

## 2012-12-26 VITALS — BP 165/101 | HR 71 | Temp 98.0°F | Resp 20 | Ht 64.5 in | Wt 182.3 lb

## 2012-12-26 DIAGNOSIS — Z7901 Long term (current) use of anticoagulants: Secondary | ICD-10-CM

## 2012-12-26 DIAGNOSIS — I2699 Other pulmonary embolism without acute cor pulmonale: Secondary | ICD-10-CM

## 2012-12-26 NOTE — Progress Notes (Addendum)
OFFICE PROGRESS NOTE  Interval history:  Victoria Campos is a 62 year old woman diagnosed with unprovoked bilateral pulmonary emboli with no obvious source in the legs July 2013. Initial partial hypercoagulation profile at that time showed normal protein S, C, antithrombin, negative cardiolipin antibodies, negative antibodies against beta-2 glycoprotein 1, negative factor V Leiden gene mutation. She had a positive lupus type anticoagulant which Dr. Cyndie Chime felt was spurious likely done when she was receiving Lovenox injections.  She was seen by Dr. Cyndie Chime in September of this year for advice on duration of anticoagulation. Additional laboratory evaluation was negative for the prothrombin gene mutation. Repeat lupus anticoagulant on 11/26/2012 was not detected. D-dimer also on 11/26/2012 was in normal range at 0.27. Serum immunoglobulins were normal and there were no monoclonal proteins on IFE.  She is seen today for followup.  She continues Coumadin anticoagulation. She denies shortness of breath, chest pain. No leg swelling or calf pain. She denies any bleeding. No skin rash.   Objective: Blood pressure 165/101, pulse 71, temperature 98 F (36.7 C), temperature source Oral, resp. rate 20, height 5' 4.5" (1.638 m), weight 182 lb 4.8 oz (82.691 kg).  Oropharynx is without thrush or ulceration. No palpable cervical, supraclavicular or axillary lymph nodes. Lungs are clear. No wheezes or rales. Regular cardiac rhythm. Abdomen is soft and nontender. No organomegaly. Extremities are without edema. Calves are soft and nontender. She is alert and oriented. Gait is normal.  Lab Results: Lab Results  Component Value Date   WBC 3.1* 11/26/2012   HGB 12.8 11/26/2012   HCT 37.0 11/26/2012   MCV 83.9 11/26/2012   PLT 220 11/26/2012    Chemistry:    Chemistry      Component Value Date/Time   NA 139 08/03/2012 0151   K 3.9 08/03/2012 0151   CL 107 08/03/2012 0151   CO2 27 08/03/2012 0151   BUN 11  08/03/2012 0151   CREATININE 1.07 08/03/2012 0151      Component Value Date/Time   CALCIUM 9.1 08/03/2012 0151   ALKPHOS 54 08/03/2012 0151   AST 24 08/03/2012 0151   ALT 25 08/03/2012 0151   BILITOT 0.3 08/03/2012 0151       Studies/Results: Mm Digital Screening  12/01/2012   *RADIOLOGY REPORT*  Clinical Data: Screening.  DIGITAL SCREENING BILATERAL MAMMOGRAM WITH CAD  Comparison:  Prior studies dating back to 05/09/2006  FINDINGS:  ACR Breast Density Category b: There are scattered areas of fibroglandular density.  There is no suspicious dominant mass, architectural distortion, or calcification to suggest malignancy.  Images were processed with CAD.  IMPRESSION: No mammographic evidence of malignancy.  A result letter of this screening mammogram will be mailed directly to the patient.  RECOMMENDATION: Screening mammogram in one year. (Code:SM-B-01Y)  BI-RADS CATEGORY 1:  Negative.   Original Report Authenticated By: Cain Saupe, M.D.    Medications: I have reviewed the patient's current medications.  Assessment/Plan:  1. Unprovoked bilateral pulmonary emboli July 2013. Negative hypercoagulation evaluation except initial positive lupus type anticoagulant felt to likely be related to being on heparin. Repeat lupus type anticoagulant 11/26/2012 was not detected.  Disposition-Dr. Cyndie Chime recommends discontinuation of full dose anticoagulation and initiation of aspirin 81 mg daily. It was reviewed with Victoria Campos at today's visit that if she does have another blood clot she will likely need indefinite anticoagulation.   She understands to seek evaluation if she develops signs/symptoms suggestive of a blood clot. We specifically discussed shortness of breath, chest pain, leg swelling,  calf pain.  We did not schedule formal followup in our office but will be happy to see her in the future if needed.  Patient seen with Dr. Cyndie Chime.     Lonna Cobb ANP/GNP-BC   Hematology oncology  attending: I personally interviewed and examined this patient and counseled her on ongoing recommendations for anticoagulation. Recommendation as outlined above in the nurse practitioner's note. There is always some uncertainty as to the duration of anticoagulation for an unprovoked event. Some experts feel that in this situation indefinite anticoagulation is indicated. There is more art  than science in trying to balance recurrent clotting with increased risk of bleeding on long-term anticoagulation. I told the patient that I am willing to give her a second chance. She will get some protection from aspirin:  approximately 30% risk reduction in recurrent events. If she does have a subsequent event then she will be committed to long-term anticoagulation.

## 2013-01-12 ENCOUNTER — Ambulatory Visit: Payer: No Typology Code available for payment source

## 2013-01-12 ENCOUNTER — Ambulatory Visit (INDEPENDENT_AMBULATORY_CARE_PROVIDER_SITE_OTHER): Payer: No Typology Code available for payment source | Admitting: Internal Medicine

## 2013-01-12 ENCOUNTER — Encounter: Payer: Self-pay | Admitting: Internal Medicine

## 2013-01-12 VITALS — BP 177/93 | HR 93 | Temp 97.7°F | Ht 64.5 in | Wt 183.8 lb

## 2013-01-12 DIAGNOSIS — J310 Chronic rhinitis: Secondary | ICD-10-CM

## 2013-01-12 DIAGNOSIS — I10 Essential (primary) hypertension: Secondary | ICD-10-CM

## 2013-01-12 DIAGNOSIS — E785 Hyperlipidemia, unspecified: Secondary | ICD-10-CM

## 2013-01-12 MED ORDER — METOPROLOL TARTRATE 50 MG PO TABS: 50.0000 mg | ORAL_TABLET | Freq: Two times a day (BID) | ORAL | Status: AC

## 2013-01-12 MED ORDER — TELMISARTAN-HCTZ 80-25 MG PO TABS: 1.0000 | ORAL_TABLET | Freq: Every day | ORAL | Status: DC

## 2013-01-12 MED ORDER — ATORVASTATIN CALCIUM 80 MG PO TABS: 80.0000 mg | ORAL_TABLET | Freq: Every day | ORAL | Status: DC

## 2013-01-12 MED ORDER — FLUTICASONE PROPIONATE 50 MCG/ACT NA SUSP: 2.0000 | Freq: Every day | NASAL | Status: DC

## 2013-01-12 NOTE — Assessment & Plan Note (Signed)
BP Readings from Last 3 Encounters:  01/12/13 177/93  12/26/12 165/101  12/22/12 155/89    Lab Results  Component Value Date   NA 139 08/03/2012   K 3.9 08/03/2012   CREATININE 1.07 08/03/2012    Assessment: Blood pressure control: moderately elevated Progress toward BP goal:  unchanged Comments: hasnt taken meds in 4 days bc of insurance issues  Plan: Medications:  continue current medications Educational resources provided: brochure;handout;video Self management tools provided:   Other plans: will refill and have her return in ~2 weeks

## 2013-01-12 NOTE — Patient Instructions (Signed)
General Instructions: Fill your prescriptions and start taling your blood pressure medications again. I have changed your nose spray and cholesterol medicine to something your insurance will cover. Return to see me in 2-3 weeks for a  Recheck.   Treatment Goals:  Goals (1 Years of Data) as of 01/12/13         As of Today 12/26/12 12/22/12 11/26/12 10/20/12     Blood Pressure    . Blood Pressure < 140/90  177/93 165/101 155/89 155/96 184/96      Progress Toward Treatment Goals:  Treatment Goal 01/12/2013  Blood pressure unchanged  Prevent falls -    Self Care Goals & Plans:  Self Care Goal 01/12/2013  Manage my medications take my medicines as prescribed; bring my medications to every visit; refill my medications on time; follow the sick day instructions if I am sick  Monitor my health keep track of my blood pressure; keep track of my weight  Eat healthy foods eat more vegetables; eat fruit for snacks and desserts; eat baked foods instead of fried foods  Be physically active find an activity I enjoy; take a walk every day    No flowsheet data found.   Care Management & Community Referrals:  No flowsheet data found.

## 2013-01-12 NOTE — Progress Notes (Signed)
  Subjective:    Patient ID: Victoria Campos, female    DOB: 18-Jul-1950, 62 y.o.   MRN: [id number]  HPI Presents for bp recheck but has been without bp meds for past 4 days because of "insurance" issues. Bp continues to be sbp 170s. States that she now has her bp meds but needs change in nasal spray and cholesterol medicine.  Denies chest pain or shortness of breath.   Review of Systems  Constitutional: Negative for fatigue.  HENT: Positive for postnasal drip and rhinorrhea. Negative for congestion.   Respiratory: Negative for shortness of breath.   Cardiovascular: Negative for chest pain.  Gastrointestinal: Negative for diarrhea and constipation.  Genitourinary: Negative for dysuria.  Neurological: Negative for weakness.  Psychiatric/Behavioral: Negative for dysphoric mood.       Objective:   Physical Exam  Constitutional: She is oriented to person, place, and time. She appears well-developed and well-nourished. No distress.  HENT:  Head: Normocephalic and atraumatic.  Eyes: Conjunctivae and EOM are normal. Pupils are equal, round, and reactive to light.  Neck: Normal range of motion. Neck supple.  Cardiovascular: Normal rate, regular rhythm, normal heart sounds and intact distal pulses.   No murmur heard. Pulmonary/Chest: Effort normal and breath sounds normal. She has no wheezes.  Abdominal: Soft. Bowel sounds are normal. She exhibits no distension. There is no tenderness.  Musculoskeletal: Normal range of motion. She exhibits no edema.  Neurological: She is alert and oriented to person, place, and time.  Skin: Skin is warm and dry.  Psychiatric: She has a normal mood and affect.          Assessment & Plan:  See separate detailed problem based charting  #1 htn: above goal -recheck after pt resumes meds  #2 rhinitis: changed to Flonase  #3 hyperlipidemia: changed to Lipitor 80 mg qd  #4 h/o pulmonary embolism: s/p coumadin therapy

## 2013-01-14 ENCOUNTER — Encounter: Payer: Self-pay | Admitting: Internal Medicine

## 2013-01-14 NOTE — Progress Notes (Signed)
Case discussed with Dr. Schooler soon after the resident saw the patient.  We reviewed the resident's history and exam and pertinent patient test results.  I agree with the assessment, diagnosis, and plan of care documented in the resident's note. 

## 2013-01-27 ENCOUNTER — Ambulatory Visit: Payer: No Typology Code available for payment source | Admitting: Internal Medicine

## 2013-01-29 ENCOUNTER — Ambulatory Visit: Payer: No Typology Code available for payment source | Admitting: Internal Medicine

## 2013-01-30 ENCOUNTER — Encounter: Payer: Self-pay | Admitting: Internal Medicine

## 2013-01-30 ENCOUNTER — Ambulatory Visit (INDEPENDENT_AMBULATORY_CARE_PROVIDER_SITE_OTHER): Payer: No Typology Code available for payment source | Admitting: Internal Medicine

## 2013-01-30 VITALS — BP 126/81 | HR 71 | Temp 98.0°F | Ht 64.5 in | Wt 182.3 lb

## 2013-01-30 DIAGNOSIS — I10 Essential (primary) hypertension: Secondary | ICD-10-CM

## 2013-01-30 NOTE — Progress Notes (Signed)
  Subjective:    Patient ID: Victoria Campos, female    DOB: Feb 16, 1951, 62 y.o.   MRN: [id number]  HPI  Presents for bp follow-up.  Last sbp 170s and today 126/81 pulse 71 bpm on regimen of metoprolol 50 mg bid and telmisartan-hct 80-25 mg qd. States that she feels "very good".  No longer fatigued.  Denies chest pain, shortness of breath, headaches or dizziness.  Review of Systems  Constitutional: Negative.   HENT: Negative.   Eyes: Negative.   Respiratory: Negative.   Cardiovascular: Negative.   Gastrointestinal: Negative.   Endocrine: Negative.   Genitourinary: Negative.   Allergic/Immunologic: Negative.   Neurological: Negative.   Hematological: Negative.   Psychiatric/Behavioral: Negative.        Objective:   Physical Exam  Constitutional: She is oriented to person, place, and time. She appears well-developed and well-nourished. No distress.  HENT:  Head: Normocephalic and atraumatic.  Eyes: Conjunctivae and EOM are normal. Pupils are equal, round, and reactive to light.  Neck: Neck supple. No thyromegaly present.  Cardiovascular: Normal rate, regular rhythm, normal heart sounds and intact distal pulses.   No murmur heard. Pulmonary/Chest: Effort normal and breath sounds normal. She has no wheezes.  Abdominal: Soft. Bowel sounds are normal. There is no tenderness.  Musculoskeletal: She exhibits no edema and no tenderness.  Neurological: She is alert and oriented to person, place, and time.  Skin: Skin is warm and dry.  Psychiatric: She has a normal mood and affect. Her behavior is normal. Judgment and thought content normal.          Assessment & Plan:  See separate problem list charting:  #1 hypertension: at goal on metoprolol 50 mg bid and micardis-hct 80-25

## 2013-01-30 NOTE — Assessment & Plan Note (Signed)
BP Readings from Last 3 Encounters:  01/30/13 126/81  01/12/13 177/93  12/26/12 165/101    Lab Results  Component Value Date   NA 139 08/03/2012   K 3.9 08/03/2012   CREATININE 1.07 08/03/2012    Assessment: Blood pressure control: controlled Progress toward BP goal:  at goal Comments: reports compliance  Plan: Medications:  continue current medications Educational resources provided: brochure;handout;video Self management tools provided:   Other plans: cont metoprolol, telmisartan-HCT 80-25 qd

## 2013-01-30 NOTE — Patient Instructions (Signed)
General Instructions: Your blood pressure is PERFECT today! Great job and managing your medications. We will not make any changes. Return to see me in 3-4 months. If you need to be seen sooner please contact the clinic [phone].   Treatment Goals:  Goals (1 Years of Data) as of 01/30/13         As of Today 01/12/13 12/26/12 12/22/12 11/26/12     Blood Pressure    . Blood Pressure < 140/90  126/81 177/93 165/101 155/89 155/96      Progress Toward Treatment Goals:  Treatment Goal 01/30/2013  Blood pressure at goal  Prevent falls -    Self Care Goals & Plans:  Self Care Goal 01/30/2013  Manage my medications take my medicines as prescribed; bring my medications to every visit; refill my medications on time; follow the sick day instructions if I am sick  Monitor my health keep track of my blood pressure; keep track of my weight  Eat healthy foods eat more vegetables; eat fruit for snacks and desserts; eat baked foods instead of fried foods; eat smaller portions; drink diet soda or water instead of juice or soda  Be physically active find an activity I enjoy; take a walk every day    No flowsheet data found.   Care Management & Community Referrals:  No flowsheet data found.

## 2013-02-10 NOTE — Progress Notes (Signed)
Case discussed with Dr. Schooler at the time of the visit.  We reviewed the resident's history and exam and pertinent patient test results.  I agree with the assessment, diagnosis, and plan of care documented in the resident's note.     

## 2013-02-27 ENCOUNTER — Emergency Department (HOSPITAL_COMMUNITY): Payer: No Typology Code available for payment source

## 2013-02-27 ENCOUNTER — Emergency Department (HOSPITAL_COMMUNITY)
Admission: EM | Admit: 2013-02-27 | Discharge: 2013-02-27 | Disposition: A | Discharge status: Home / Self Care | Payer: No Typology Code available for payment source | Attending: Emergency Medicine | Admitting: Emergency Medicine

## 2013-02-27 ENCOUNTER — Encounter (HOSPITAL_COMMUNITY): Payer: Self-pay | Admitting: Emergency Medicine

## 2013-02-27 DIAGNOSIS — Z87891 Personal history of nicotine dependence: Secondary | ICD-10-CM | POA: Insufficient documentation

## 2013-02-27 DIAGNOSIS — Z86711 Personal history of pulmonary embolism: Secondary | ICD-10-CM | POA: Insufficient documentation

## 2013-02-27 DIAGNOSIS — R059 Cough, unspecified: Secondary | ICD-10-CM | POA: Insufficient documentation

## 2013-02-27 DIAGNOSIS — Z79899 Other long term (current) drug therapy: Secondary | ICD-10-CM | POA: Insufficient documentation

## 2013-02-27 DIAGNOSIS — R079 Chest pain, unspecified: Secondary | ICD-10-CM

## 2013-02-27 DIAGNOSIS — IMO0002 Reserved for concepts with insufficient information to code with codable children: Secondary | ICD-10-CM | POA: Insufficient documentation

## 2013-02-27 DIAGNOSIS — I10 Essential (primary) hypertension: Secondary | ICD-10-CM | POA: Insufficient documentation

## 2013-02-27 DIAGNOSIS — R05 Cough: Secondary | ICD-10-CM | POA: Insufficient documentation

## 2013-02-27 DIAGNOSIS — R011 Cardiac murmur, unspecified: Secondary | ICD-10-CM | POA: Insufficient documentation

## 2013-02-27 DIAGNOSIS — E7800 Pure hypercholesterolemia, unspecified: Secondary | ICD-10-CM | POA: Insufficient documentation

## 2013-02-27 DIAGNOSIS — Z7982 Long term (current) use of aspirin: Secondary | ICD-10-CM | POA: Insufficient documentation

## 2013-02-27 DIAGNOSIS — R0789 Other chest pain: Secondary | ICD-10-CM | POA: Insufficient documentation

## 2013-02-27 LAB — CBC
HCT: 36.5 % (ref 36.0–46.0)
Hemoglobin: 12.8 g/dL (ref 12.0–15.0)
MCH: 29.2 pg (ref 26.0–34.0)
MCHC: 35.1 g/dL (ref 30.0–36.0)
MCV: 83.1 fL (ref 78.0–100.0)
Platelets: 227 10*3/uL (ref 150–400)
RBC: 4.39 MIL/uL (ref 3.87–5.11)
RDW: 13 % (ref 11.5–15.5)
WBC: 5.6 10*3/uL (ref 4.0–10.5)

## 2013-02-27 LAB — POCT I-STAT, CHEM 8
Calcium, Ion: 1.23 mmol/L (ref 1.13–1.30)
Chloride: 104 mEq/L (ref 96–112)
Creatinine, Ser: 0.8 mg/dL (ref 0.50–1.10)
Glucose, Bld: 112 mg/dL — ABNORMAL HIGH (ref 70–99)
HCT: 40 % (ref 36.0–46.0)
Hemoglobin: 13.6 g/dL (ref 12.0–15.0)
Potassium: 3.6 mEq/L (ref 3.5–5.1)
TCO2: 23 mmol/L (ref 0–100)

## 2013-02-27 MED ORDER — OXYCODONE-ACETAMINOPHEN 5-325 MG PO TABS
2.0000 | ORAL_TABLET | Freq: Once | ORAL | Status: AC
  Administered 2013-02-27: 2 via ORAL
  Filled 2013-02-27: qty 2

## 2013-02-27 MED ORDER — IBUPROFEN 200 MG PO TABS
600.0000 mg | ORAL_TABLET | Freq: Once | ORAL | Status: AC
  Administered 2013-02-27: 600 mg via ORAL
  Filled 2013-02-27: qty 3

## 2013-02-27 MED ORDER — ALBUTEROL SULFATE HFA 108 (90 BASE) MCG/ACT IN AERS
2.0000 | INHALATION_SPRAY | Freq: Once | RESPIRATORY_TRACT | Status: AC
  Administered 2013-02-27: 2 via RESPIRATORY_TRACT
  Filled 2013-02-27: qty 6.7

## 2013-02-27 NOTE — ED Notes (Signed)
Patient transported to X-ray 

## 2013-02-27 NOTE — ED Notes (Signed)
MD at bedside. 

## 2013-02-27 NOTE — ED Notes (Signed)
Patient c/o low back pain and a productive cough with small amount of white sputum. Patient states she woke this AM at 0530 with heaviness of her central chest and was worse with coughing and deep breathing.

## 2013-02-27 NOTE — ED Provider Notes (Signed)
CSN: [id number]     Arrival date & time 02/27/13  1254 History   First MD Initiated Contact with Patient 02/27/13 1409     Chief Complaint  Patient presents with  . Cough  . Chest Pain   (Consider location/radiation/quality/duration/timing/severity/associated sxs/prior Treatment) HPI  62 year old female with chest pain. Symptom onset this morning. She first noticed when she woke up. Pain is in the Center for chest and sharp in nature. Chest with coughing and when inhaling deeply. No fevers or chills. Cough is nonproductive. No unusual leg pain or swelling. No n/v. No change with exertion. No diaphoresis or palpitations. No recent surgery, prolonged immobilizations. Does have a hx of PE. Not on blood thinners aside from asa.   Past Medical History  Diagnosis Date  . Hypertension   . Hypercholesteremia   . Heart murmur   . Insomnia   . PE (pulmonary embolism)    Past Surgical History  Procedure Laterality Date  . [place]      removed  . Tubal ligation    . Arm surgery      torn ligamint   Family History  Problem Relation Age of Onset  . Cancer Brother     colon  . Cancer Sister     colon  . Cancer Sister     breast  . Heart attack Father   . Heart disease Father    History  Substance Use Topics  . Smoking status: Former Smoker    Types: Cigarettes    Quit date: 03/12/1968  . Smokeless tobacco: Never Used  . Alcohol Use: 0.6 oz/week    1 Glasses of wine per week     Comment: occasional   OB History   Grav Para Term Preterm Abortions TAB SAB Ect Mult Living                 Review of Systems  All systems reviewed and negative, other than as noted in HPI.   Allergies  Sulfamethoxazole-trimethoprim  Home Medications   Current Outpatient Rx  Name  Route  Sig  Dispense  Refill  . amLODipine (NORVASC) 5 MG tablet   Oral   Take 1 tablet by mouth daily.         Marland Kitchen aspirin 325 MG tablet   Oral   Take 650 mg by mouth every 6 (six) hours as needed for  moderate pain.         Marland Kitchen aspirin 81 MG chewable tablet   Oral   Chew 81 mg by mouth daily.         Marland Kitchen atorvastatin (LIPITOR) 80 MG tablet   Oral   Take 1 tablet (80 mg total) by mouth daily.   30 tablet   6   . fluticasone (FLONASE) 50 MCG/ACT nasal spray   Nasal   Place 2 sprays into the nose daily.   16 g   2   . metoprolol (LOPRESSOR) 50 MG tablet   Oral   Take 1 tablet (50 mg total) by mouth 2 (two) times daily.   90 tablet   3   . telmisartan-hydrochlorothiazide (MICARDIS HCT) 80-25 MG per tablet   Oral   Take 1 tablet by mouth daily.   90 tablet   3    BP 197/91  Pulse 79  Temp(Src) 98.3 F (36.8 C) (Oral)  Resp 16  SpO2 98% Physical Exam  Nursing note and vitals reviewed. Constitutional: She appears well-developed and well-nourished. No distress.  HENT:  Head:  Normocephalic and atraumatic.  Eyes: Conjunctivae are normal. Right eye exhibits no discharge. Left eye exhibits no discharge.  Neck: Neck supple.  Cardiovascular: Normal rate, regular rhythm and normal heart sounds.  Exam reveals no gallop and no friction rub.   No murmur heard. Pulmonary/Chest: Effort normal and breath sounds normal. No respiratory distress. She exhibits no tenderness.  Abdominal: Soft. She exhibits no distension. There is no tenderness.  Musculoskeletal: She exhibits no edema and no tenderness.  Lower extremities symmetric as compared to each other. No calf tenderness. Negative Homan's. No palpable cords.   Neurological: She is alert.  Skin: Skin is warm and dry.  Psychiatric: She has a normal mood and affect. Her behavior is normal. Thought content normal.    ED Course  Procedures (including critical care time) Labs Review Labs Reviewed  POCT I-STAT, CHEM 8 - Abnormal; Notable for the following:    Glucose, Bld 112 (*)    All other components within normal limits  CBC  POCT I-STAT TROPONIN I   Imaging Review Dg Chest 2 View  02/27/2013   CLINICAL DATA:  Chest  pain, cough and congestion.  EXAM: CHEST  2 VIEW  COMPARISON:  01/15/2012  FINDINGS: Heart, mediastinum and hila are normal.  Clear lungs.  No pleural effusion or pneumothorax.  Bony thorax is demineralized. There are changes from previous right rotator cuff surgery.  IMPRESSION: No active cardiopulmonary disease.   Electronically Signed   By: Amie Portland M.D.   On: 02/27/2013 14:24    EKG Interpretation    Date/Time:  Friday February 27 2013 13:04:02 EST Ventricular Rate:  79 PR Interval:  193 QRS Duration: 80 QT Interval:  370 QTC Calculation: 424 R Axis:   -13 Text Interpretation:  Sinus rhythm left atrial enlargement Anterior infarct, old Baseline wander in lead(s) II III aVF Confirmed by Damarco Keysor  MD, Kindsey Eblin ([id number]) on 02/27/2013 2:54:19 PM            MDM   1. Chest pain    62 year old female chest pain. This is atypical for ACS. Pleuritic in nature. No increased work of breathing. Oxygen saturations are normal. Chest x-ray is clear. Doubt ACS, pulmonary embolism, bacterial infection or dissection. To be significantly hypertensive. She reports compliance with her medications. Discussed need to have this rechecked and followup with her PCP.  Raeford Razor, MD 03/10/13 2055

## 2013-03-16 ENCOUNTER — Other Ambulatory Visit: Payer: Self-pay | Admitting: *Deleted

## 2013-03-17 MED ORDER — FLUTICASONE PROPIONATE 50 MCG/ACT NA SUSP: 2.0000 | Freq: Every day | NASAL | Status: DC

## 2013-03-20 ENCOUNTER — Encounter: Payer: Self-pay | Admitting: Internal Medicine

## 2013-03-20 ENCOUNTER — Ambulatory Visit (INDEPENDENT_AMBULATORY_CARE_PROVIDER_SITE_OTHER): Payer: No Typology Code available for payment source | Admitting: Internal Medicine

## 2013-03-20 VITALS — BP 160/93 | HR 92 | Temp 98.6°F | Ht 64.5 in | Wt 183.1 lb

## 2013-03-20 DIAGNOSIS — H6120 Impacted cerumen, unspecified ear: Secondary | ICD-10-CM | POA: Insufficient documentation

## 2013-03-20 DIAGNOSIS — H6123 Impacted cerumen, bilateral: Secondary | ICD-10-CM

## 2013-03-20 DIAGNOSIS — I10 Essential (primary) hypertension: Secondary | ICD-10-CM

## 2013-03-20 DIAGNOSIS — H5210 Myopia, unspecified eye: Secondary | ICD-10-CM

## 2013-03-20 DIAGNOSIS — H5212 Myopia, left eye: Secondary | ICD-10-CM | POA: Insufficient documentation

## 2013-03-20 LAB — LIPID PANEL
Cholesterol: 182 mg/dL (ref 0–200)
HDL: 51 mg/dL (ref >39)
LDL Cholesterol: 112 mg/dL — ABNORMAL HIGH (ref 0–99)
Total CHOL/HDL Ratio: 3.6 Ratio
Triglycerides: 96 mg/dL (ref <150)
VLDL: 19 mg/dL (ref 0–40)

## 2013-03-20 MED ORDER — AMLODIPINE BESYLATE 5 MG PO TABS: 5.0000 mg | ORAL_TABLET | Freq: Every day | ORAL | Status: DC

## 2013-03-20 NOTE — Progress Notes (Signed)
Subjective:   Patient ID: Victoria Campos female   DOB: [birth year] 63 y.o.   MRN: [id number]  Chief Complaint  Patient presents with  . form    form to be complete for work.    HPI: [name] is a 63 y.o. woman with history of HTN, BPPV, & anxiety/depression who presents for job clearance, as she was recently offered a job driving school buses.  During pre-employment physical, they found vision to be 20/40 OS. Subsequently, she has gotten glasses from at  [employer]. She comes to clinic to have vision rechecked.  Her only request today is that her ears be flushed due to excess wax.  Has been feeling like ears are stopped up. No ear pain or discharge. Minimal ringing in ears. Hasn't affected hearing.   BP elevated today, takes medications daily at 6p  Review of Systems: Constitutional: Denies fever, chills, diaphoresis, appetite change and fatigue.  HEENT: Denies photophobia, eye pain, redness, hearing loss, ear pain, congestion, sore throat, rhinorrhea, sneezing, mouth sores, trouble swallowing, neck pain, neck stiffness and tinnitus.  Respiratory: Denies SOB, DOE, chest tightness, and wheezing. +recent cough/bronchitis in December, seen in ED, treated with albuterol inhaler, doing much better Cardiovascular: Denies chest pain, palpitations and leg swelling.  Gastrointestinal: Denies nausea, vomiting, abdominal pain, diarrhea, constipation,blood in stool and abdominal distention.  Genitourinary: Denies dysuria, urgency, frequency, hematuria, flank pain and difficulty urinating.  Musculoskeletal: Denies myalgias, back pain, joint swelling, arthralgias and gait problem.  Skin: Denies pallor, rash and wound.  Neurological: Denies dizziness, seizures, syncope, weakness, lightheadedness, numbness and headaches.  Hematological: No s/s of bleeding  Psychiatric/Behavioral: Denies suicidal ideation, mood changes, confusion, nervousness, sleep disturbance and agitation  Past Medical  History  Diagnosis Date  . Hypertension   . Hypercholesteremia   . Heart murmur   . Insomnia   . PE (pulmonary embolism)    Current Outpatient Prescriptions  Medication Sig Dispense Refill  . aspirin 81 MG chewable tablet Chew 81 mg by mouth daily.      Marland Kitchen atorvastatin (LIPITOR) 80 MG tablet Take 1 tablet (80 mg total) by mouth daily.  30 tablet  6  . fluticasone (FLONASE) 50 MCG/ACT nasal spray Place 2 sprays into both nostrils daily.  16 g  2  . metoprolol (LOPRESSOR) 50 MG tablet Take 1 tablet (50 mg total) by mouth 2 (two) times daily.  90 tablet  3  . telmisartan-hydrochlorothiazide (MICARDIS HCT) 80-25 MG per tablet Take 1 tablet by mouth daily.  90 tablet  3   No current facility-administered medications for this visit.   Family History  Problem Relation Age of Onset  . Cancer Brother     colon  . Cancer Sister     colon  . Cancer Sister     breast  . Heart attack Father   . Heart disease Father    History   Social History  . Marital Status: Single    Spouse Name: N/A    Number of Children: N/A  . Years of Education: N/A   Social History Main Topics  . Smoking status: Former Smoker    Types: Cigarettes    Quit date: 03/12/1968  . Smokeless tobacco: Never Used  . Alcohol Use: 0.6 oz/week    1 Glasses of wine per week     Comment: occasional  . Drug Use: No  . Sexual Activity: Not on file   Other Topics Concern  . Not on file   Social History  Narrative  . No narrative on file    Objective:  Physical Exam: Filed Vitals:   03/20/13 1402 03/20/13 1403  BP: 147/93 160/93  Pulse: 96 92  Temp: 98.6 F (37 C)   TempSrc: Oral   Height: 5' 4.5" (1.638 m)   Weight: 183 lb 1.6 oz (83.054 kg)   SpO2: 99%    General: pleasant, appears as stated age, no acute distress HEENT: PERRL, EOMI, no scleral icterus, no LAD, +torus pallatini, cerumen impacting b/l ear canals, difficult to visualize TMs; vision 20/20 OS, 20/25 OD Cardiac: RRR, +systolic murmur at  right 2nd intercostal space Pulm: clear to auscultation bilaterally, moving normal volumes of air Abd: soft, nontender, nondistended, BS present Ext: warm and well perfused, no pedal edema Neuro: alert and oriented X3, cranial nerves II-XII grossly intact, 5/5 strength in b/l UE & LE, full ROM of b/l UE  Assessment & Plan:  Case and care discussed with Dr. Marinda Elk.  Please see problem oriented charting for further details. Patient to return in 1 month for HTN follow up.

## 2013-03-20 NOTE — Patient Instructions (Signed)
General Instructions:  -I have filled out your paperwork for work - it is important that you were your corrective lenses when you drive -Your blood pressure remains elevated, please start a new medication called amlodipine, 5mg  (1 pill) daily in addition to the medications you are already taking -Today, we cleaned the wax out of your ears  Please be sure to bring all of your medications with you to every visit.  Should you have any new or worsening symptoms, please be sure to call the clinic at [phone].   Treatment Goals:  Goals (1 Years of Data) as of 03/20/13         As of Today As of Today 02/27/13 02/27/13 01/30/13     Blood Pressure    . Blood Pressure < 140/90  160/93 147/93 201/92 197/91 126/81      Progress Toward Treatment Goals:  Treatment Goal 03/20/2013  Blood pressure unchanged  Prevent falls -    Self Care Goals & Plans:  Self Care Goal 03/20/2013  Manage my medications take my medicines as prescribed; bring my medications to every visit; refill my medications on time; follow the sick day instructions if I am sick  Monitor my health keep track of my blood pressure; keep track of my weight  Eat healthy foods eat more vegetables; eat fruit for snacks and desserts; eat baked foods instead of fried foods; eat smaller portions  Be physically active find an activity I enjoy; take a walk every day  Meeting treatment goals maintain the current self-care plan    Care Management & Community Referrals:  Referral 03/20/2013  Referrals made for care management support none needed

## 2013-03-20 NOTE — Assessment & Plan Note (Signed)
Ears disimpacted b/l by RN

## 2013-03-20 NOTE — Progress Notes (Signed)
Case discussed with Dr. Sharda soon after the resident saw the patient.  We reviewed the resident's history and exam and pertinent patient test results.  I agree with the assessment, diagnosis, and plan of care documented in the resident's note. 

## 2013-03-20 NOTE — Assessment & Plan Note (Addendum)
Vision at employment physical was 20/40 OS.  She has subsequently acquired corrective lenses and vision in clinic is 20/20 OS, 20/25 OD.  I will complete her paperwork as physically & emotionally acceptable for the position offered based on my history and exam today.  Continue her corrective lenses.

## 2013-03-20 NOTE — Assessment & Plan Note (Signed)
BP Readings from Last 3 Encounters:  03/20/13 160/93  02/27/13 201/92  01/30/13 126/81    Lab Results  Component Value Date   NA 141 02/27/2013   K 3.6 02/27/2013   CREATININE 0.80 02/27/2013    Assessment: Blood pressure control: moderately elevated Progress toward BP goal:  unchanged  Plan: Medications:  continue telmisartan-hctz 80-25, metoprolol 50 bid, and start amlodipine 5mg  daily; return in 1 month for BP follow up; I do not see that she has been diagnosed with CAD, in which case we may consider titrating off metoprolol Educational resources provided: brochure;handout;video

## 2013-03-23 ENCOUNTER — Other Ambulatory Visit: Payer: No Typology Code available for payment source

## 2013-04-14 ENCOUNTER — Encounter: Payer: Self-pay | Admitting: *Deleted

## 2013-06-01 ENCOUNTER — Ambulatory Visit: Payer: No Typology Code available for payment source | Admitting: Internal Medicine

## 2013-06-01 ENCOUNTER — Ambulatory Visit (INDEPENDENT_AMBULATORY_CARE_PROVIDER_SITE_OTHER): Payer: No Typology Code available for payment source | Admitting: Internal Medicine

## 2013-06-01 ENCOUNTER — Encounter: Payer: Self-pay | Admitting: Internal Medicine

## 2013-06-01 VITALS — BP 137/78 | HR 62 | Temp 97.6°F | Ht 64.5 in | Wt 186.1 lb

## 2013-06-01 DIAGNOSIS — I10 Essential (primary) hypertension: Secondary | ICD-10-CM

## 2013-06-01 MED ORDER — HYDROCHLOROTHIAZIDE 12.5 MG PO TABS: 12.5000 mg | ORAL_TABLET | Freq: Every day | ORAL | Status: DC

## 2013-06-01 NOTE — Progress Notes (Signed)
Case discussed with Dr. Michail Sermon soon after the resident saw the patient.  We reviewed the resident's history and exam and pertinent patient test results.  I agree with the assessment, diagnosis and plan of care documented in the resident's note.

## 2013-06-01 NOTE — Progress Notes (Signed)
   Subjective:    Patient ID: Victoria Campos, female    DOB: 1950-03-28, 63 y.o.   MRN: [id number]  HPI  Pt presents for blood pressure follow-up after starting amlodipine 5 mg in Jan 2015.  Her hx is significant for hypertension, pulm embolism on long-term therapy Warfarin, anxiety and depression, migraines, and vertigo. States that she has only been taking the amlodipine 5 mg and metoprolol 50 mg bid for her blood pressure because she was not able to get the telmisartan-hctz for some time now. Bp today 137/78 pulse 62 bpm.  SHe states that she has been taking her bp at home and it ranges from 371I-967'E systolic.  Also reports that she has much less stress since her daughter and grandchildren moved out of her home.  Review of Systems  Constitutional: Negative.   HENT: Negative.   Eyes: Negative.   Respiratory: Negative.   Cardiovascular: Negative.   Gastrointestinal: Negative.   Genitourinary: Negative.   Musculoskeletal: Negative.   Skin: Negative.   Allergic/Immunologic: Negative.   Neurological: Negative.   Hematological: Negative.   Psychiatric/Behavioral: Negative.        Objective:   Physical Exam  Constitutional: She is oriented to person, place, and time. She appears well-developed and well-nourished. No distress.  HENT:  Head: Normocephalic and atraumatic.  Eyes: Conjunctivae and EOM are normal. Pupils are equal, round, and reactive to light.  Neck: Normal range of motion. Neck supple. No thyromegaly present.  Cardiovascular: Normal rate, regular rhythm, normal heart sounds and intact distal pulses.   Pulmonary/Chest: Effort normal and breath sounds normal.  Abdominal: Soft. Bowel sounds are normal. She exhibits no distension. There is no tenderness.  Musculoskeletal: She exhibits no edema and no tenderness.  Neurological: She is alert and oriented to person, place, and time.  Skin: Skin is warm and dry.  Psychiatric: She has a normal mood and affect. Her behavior is  normal. Judgment and thought content normal.          Assessment & Plan:  See problem-list charting for full details:  1. Hypertension: at goal today 137/78 pulse 62 bpm on CCB and BB, some elevations on home bp monitoring -add hctz 12.5 mg qd

## 2013-06-01 NOTE — Assessment & Plan Note (Addendum)
BP Readings from Last 3 Encounters:  06/01/13 137/78  03/20/13 160/93  02/27/13 201/92    Lab Results  Component Value Date   NA 141 02/27/2013   K 3.6 02/27/2013   CREATININE 0.80 02/27/2013    Assessment: Blood pressure control: controlled Progress toward BP goal:  at goal Comments: on amlodipine 5 mg.  Pt states that at home bp run 147-/80 -168/92 this past week.  Plan: Medications:  continue current medications Educational resources provided: brochure;handout;video Self management tools provided:   Other plans: will add hct 12.5 mg today, take telmisartan-hctz off med list as she has not taken this in months secondary to inability to afford, this had been started when she was [employer] pt and [facility] carried the telmisartan-hctz.  -regimen will now be amlodipine 5 mg qd, metoprolol 50 mg qd and hctz 12.5 mg qd

## 2013-06-01 NOTE — Patient Instructions (Signed)
General Instructions:  We have filled out the paper work for your job. Your blood pressure is doing much better today. It sounds like it still may run a bit high at home sometimes. Please pick up a new medication (hydrochlorothiazide) from your pharmacy and take once a day. Retun in 3 months for further blood pressure check.  Thank you for bringing your medicines today. This helps [place] keep you safe from mistakes!    Treatment Goals:  Goals (1 Years of Data) as of 06/01/13         As of Today 03/20/13 03/20/13 02/27/13 02/27/13     Blood Pressure    . Blood Pressure < 140/90  137/78 160/93 147/93 201/92 197/91      Progress Toward Treatment Goals:  Treatment Goal 06/01/2013  Blood pressure at goal  Prevent falls -    Self Care Goals & Plans:  Self Care Goal 06/01/2013  Manage my medications take my medicines as prescribed; bring my medications to every visit; refill my medications on time; follow the sick day instructions if I am sick  Monitor my health keep track of my blood pressure; keep track of my weight  Eat healthy foods eat more vegetables; eat fruit for snacks and desserts; eat baked foods instead of fried foods  Be physically active find an activity I enjoy  Meeting treatment goals -    No flowsheet data found.   Care Management & Community Referrals:  Referral 03/20/2013  Referrals made for care management support none needed      Hydrochlorothiazide, HCTZ capsules or tablets What is this medicine? HYDROCHLOROTHIAZIDE (hye droe klor oh THYE a zide) is a diuretic. It increases the amount of urine passed, which causes the body to lose salt and water. This medicine is used to treat high blood pressure. It is also reduces the swelling and water retention caused by various medical conditions, such as heart, liver, or kidney disease. This medicine may be used for other purposes; ask your health care provider or pharmacist if you have questions. COMMON BRAND NAME(S):  Esidrix, Ezide, HydroDIURIL, Microzide, Oretic, Zide What should I tell my health care provider before I take this medicine? They need to know if you have any of these conditions: -diabetes -gout -immune system problems, like lupus -kidney disease or kidney stones -liver disease -pancreatitis -small amount of urine or difficulty passing urine -an unusual or allergic reaction to hydrochlorothiazide, sulfa drugs, other medicines, foods, dyes, or preservatives -pregnant or trying to get pregnant -breast-feeding How should I use this medicine? Take this medicine by mouth with a glass of water. Follow the directions on the prescription label. Take your medicine at regular intervals. Remember that you will need to pass urine frequently after taking this medicine. Do not take your doses at a time of day that will cause you problems. Do not stop taking your medicine unless your doctor tells you to. Talk to your pediatrician regarding the use of this medicine in children. Special care may be needed. Overdosage: If you think you have taken too much of this medicine contact a poison control center or emergency room at once. NOTE: This medicine is only for you. Do not share this medicine with others. What if I miss a dose? If you miss a dose, take it as soon as you can. If it is almost time for your next dose, take only that dose. Do not take double or extra doses. What may interact with this medicine? -cholestyramine -colestipol -digoxin -  dofetilide -lithium -medicines for blood pressure -medicines for diabetes -medicines that relax muscles for surgery -other diuretics -steroid medicines like prednisone or cortisone This list may not describe all possible interactions. Give your health care provider a list of all the medicines, herbs, non-prescription drugs, or dietary supplements you use. Also tell them if you smoke, drink alcohol, or use illegal drugs. Some items may interact with your  medicine. What should I watch for while using this medicine? Visit your doctor or health care professional for regular checks on your progress. Check your blood pressure as directed. Ask your doctor or health care professional what your blood pressure should be and when you should contact him or her. You may need to be on a special diet while taking this medicine. Ask your doctor. Check with your doctor or health care professional if you get an attack of severe diarrhea, nausea and vomiting, or if you sweat a lot. The loss of too much body fluid can make it dangerous for you to take this medicine. You may get drowsy or dizzy. Do not drive, use machinery, or do anything that needs mental alertness until you know how this medicine affects you. Do not stand or sit up quickly, especially if you are an older patient. This reduces the risk of dizzy or fainting spells. Alcohol may interfere with the effect of this medicine. Avoid alcoholic drinks. This medicine may affect your blood sugar level. If you have diabetes, check with your doctor or health care professional before changing the dose of your diabetic medicine. This medicine can make you more sensitive to the sun. Keep out of the sun. If you cannot avoid being in the sun, wear protective clothing and use sunscreen. Do not use sun lamps or tanning beds/booths. What side effects may I notice from receiving this medicine? Side effects that you should report to your doctor or health care professional as soon as possible: -allergic reactions such as skin rash or itching, hives, swelling of the lips, mouth, tongue, or throat -changes in vision -chest pain -eye pain -fast or irregular heartbeat -feeling faint or lightheaded, falls -gout attack -muscle pain or cramps -pain or difficulty when passing urine -pain, tingling, numbness in the hands or feet -redness, blistering, peeling or loosening of the skin, including inside the mouth -unusually weak or  tired Side effects that usually do not require medical attention (report to your doctor or health care professional if they continue or are bothersome): -change in sex drive or performance -dry mouth -headache -stomach upset This list may not describe all possible side effects. Call your doctor for medical advice about side effects. You may report side effects to FDA at 1-800-FDA-1088. Where should I keep my medicine? Keep out of the reach of children. Store at room temperature between 15 and 30 degrees C (59 and 86 degrees F). Do not freeze. Protect from light and moisture. Keep container closed tightly. Throw away any unused medicine after the expiration date. NOTE: This sheet is a summary. It may not cover all possible information. If you have questions about this medicine, talk to your doctor, pharmacist, or health care provider.  2014, Elsevier/Gold Standard. (2009-10-21 12:57:37)

## 2013-06-17 ENCOUNTER — Ambulatory Visit: Payer: Self-pay

## 2013-06-19 ENCOUNTER — Emergency Department (INDEPENDENT_AMBULATORY_CARE_PROVIDER_SITE_OTHER)
Admission: EM | Admit: 2013-06-19 | Discharge: 2013-06-19 | Disposition: A | Discharge status: Home / Self Care | Payer: Self-pay | Source: Home / Self Care | Attending: Emergency Medicine | Admitting: Emergency Medicine

## 2013-06-19 ENCOUNTER — Emergency Department (INDEPENDENT_AMBULATORY_CARE_PROVIDER_SITE_OTHER): Payer: Self-pay

## 2013-06-19 DIAGNOSIS — M542 Cervicalgia: Secondary | ICD-10-CM

## 2013-06-19 DIAGNOSIS — M549 Dorsalgia, unspecified: Secondary | ICD-10-CM

## 2013-06-19 DIAGNOSIS — S139XXA Sprain of joints and ligaments of unspecified parts of neck, initial encounter: Secondary | ICD-10-CM

## 2013-06-19 DIAGNOSIS — S335XXA Sprain of ligaments of lumbar spine, initial encounter: Secondary | ICD-10-CM

## 2013-06-19 DIAGNOSIS — S134XXA Sprain of ligaments of cervical spine, initial encounter: Secondary | ICD-10-CM

## 2013-06-19 MED ORDER — TRAMADOL HCL 50 MG PO TABS: 50.0000 mg | ORAL_TABLET | Freq: Four times a day (QID) | ORAL | Status: DC | PRN

## 2013-06-19 MED ORDER — NAPROXEN 500 MG PO TABS
500.0000 mg | ORAL_TABLET | Freq: Two times a day (BID) | ORAL | Status: DC
Start: 2013-06-19 — End: 2013-09-17

## 2013-06-19 MED ORDER — CYCLOBENZAPRINE HCL 5 MG PO TABS: ORAL_TABLET | ORAL | Status: DC
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
## 2013-06-19 NOTE — ED Provider Notes (Signed)
Medical screening examination/treatment/procedure(s) were performed by non-physician practitioner and as supervising physician I was immediately available for consultation/collaboration.  Philipp Deputy, M.D.  Harden Mo, MD 06/19/13 [phone]
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
## 2013-06-19 NOTE — ED Notes (Signed)
Physician triaged and assess pt before nurse went in.

## 2013-06-22 NOTE — ED Notes (Signed)
Accessed record for patient questions

## 2013-06-23 NOTE — ED Notes (Signed)
Accessed record for patient question

## 2013-06-24 ENCOUNTER — Ambulatory Visit: Payer: Self-pay

## 2013-09-17 ENCOUNTER — Encounter: Payer: Self-pay | Admitting: Internal Medicine

## 2013-09-17 ENCOUNTER — Ambulatory Visit (INDEPENDENT_AMBULATORY_CARE_PROVIDER_SITE_OTHER): Payer: No Typology Code available for payment source | Admitting: Internal Medicine

## 2013-09-17 VITALS — BP 151/83 | HR 58 | Temp 97.8°F | Ht 64.5 in | Wt 180.4 lb

## 2013-09-17 DIAGNOSIS — Z0000 Encounter for general adult medical examination without abnormal findings: Secondary | ICD-10-CM | POA: Insufficient documentation

## 2013-09-17 DIAGNOSIS — I10 Essential (primary) hypertension: Secondary | ICD-10-CM

## 2013-09-17 DIAGNOSIS — H6120 Impacted cerumen, unspecified ear: Secondary | ICD-10-CM

## 2013-09-17 DIAGNOSIS — H6122 Impacted cerumen, left ear: Secondary | ICD-10-CM

## 2013-09-17 NOTE — Assessment & Plan Note (Signed)
BP Readings from Last 3 Encounters:  09/17/13 151/83  06/19/13 170/79  06/01/13 137/78    Lab Results  Component Value Date   NA 141 02/27/2013   K 3.6 02/27/2013   CREATININE 0.80 02/27/2013    Assessment: Blood pressure control: controlled Progress toward BP goal:  at goal Comments: BP dropped to 144/78 on recheck. She will continue her current regimen.  Plan: Medications:  continue current medications, HCTZ 12.5mg , amlodipine 5mg , metoprolol 50mg  BID Educational resources provided:   Self management tools provided:   Other plans: Follow-up in 4 weeks to reassess with PCP

## 2013-09-17 NOTE — Progress Notes (Signed)
I saw and evaluated the patient.  I personally confirmed the key portions of the history and exam documented by Dr. Patel and I reviewed pertinent patient test results.  The assessment, diagnosis, and plan were formulated together and I agree with the documentation in the resident's note. 

## 2013-09-17 NOTE — Patient Instructions (Addendum)
Please return as needed if you need to have yours ears cleaned.  Your blood pressure looks good.   Please follow-up with Dr. Raelene Bott in 4 weeks.

## 2013-09-17 NOTE — Progress Notes (Signed)
   Subjective:    Patient ID: Victoria Campos, female    DOB: 1950-11-04, 63 y.o.   MRN: [id number]  HPI Victoria Campos is a 63 year old female with a history of HTN, BPPV, anxiety/depression who presents today for left ear pain.   Feels like it has stopped for the last 1.5 weeks. Tried cleaning ears at home with water but didn't work. Has had 1 headache that she thinks is related to the ear pain.  No fever, coughing, itchiness, leakage of fluid or blood.   BP today was 151/83 initially but after ear cleaning came down to 144/78. She currently takes HCTZ 12.5mg , amlodipine 5mg , metoprolol 50mg  BID. She has not reported problems with the medications she is on and takes them regularly.   Review of Systems  Constitutional: Negative for fever.  HENT: Positive for ear pain. Negative for ear discharge and hearing loss.   Respiratory: Negative for cough.   Cardiovascular: Negative for chest pain.  Neurological: Positive for headaches.       Objective:   Physical Exam  Constitutional: She is oriented to person, place, and time. She appears well-developed. No distress.  HENT:  Head: Normocephalic and atraumatic.  Both ears filled with cerumen. TM visualized on right but difficult to see on left.   Cardiovascular: Normal rate, regular rhythm and normal heart sounds.   Pulmonary/Chest: Effort normal and breath sounds normal. No respiratory distress.  Neurological: She is alert and oriented to person, place, and time.  CN II-XII grossly intact.  Skin: Skin is warm. No erythema.  Psychiatric: She has a normal mood and affect. Her behavior is normal.          Assessment & Plan:

## 2013-09-17 NOTE — Assessment & Plan Note (Addendum)
-  RN cleaned both ears today which improved her hearing and pain markedly -Advised patient not to self-clean ears

## 2013-11-18 ENCOUNTER — Other Ambulatory Visit: Payer: Self-pay | Admitting: *Deleted

## 2013-11-18 DIAGNOSIS — I10 Essential (primary) hypertension: Secondary | ICD-10-CM

## 2013-11-18 MED ORDER — AMLODIPINE BESYLATE 5 MG PO TABS
5.0000 mg | ORAL_TABLET | Freq: Every day | ORAL | Status: DC
Start: 2013-11-18 — End: 2015-01-11

## 2013-11-24 ENCOUNTER — Emergency Department (INDEPENDENT_AMBULATORY_CARE_PROVIDER_SITE_OTHER)
Admission: EM | Admit: 2013-11-24 | Discharge: 2013-11-24 | Disposition: A | Discharge status: Home / Self Care | Payer: No Typology Code available for payment source | Source: Home / Self Care | Attending: Emergency Medicine | Admitting: Emergency Medicine

## 2013-11-24 ENCOUNTER — Encounter (HOSPITAL_COMMUNITY): Payer: Self-pay | Admitting: Emergency Medicine

## 2013-11-24 DIAGNOSIS — G43009 Migraine without aura, not intractable, without status migrainosus: Secondary | ICD-10-CM

## 2013-11-24 MED ORDER — KETOROLAC TROMETHAMINE 60 MG/2ML IM SOLN
60.0000 mg | Freq: Once | INTRAMUSCULAR | Status: AC
  Administered 2013-11-24: 60 mg via INTRAMUSCULAR

## 2013-11-24 MED ORDER — METOCLOPRAMIDE HCL 5 MG/ML IJ SOLN
10.0000 mg | Freq: Once | INTRAMUSCULAR | Status: AC
  Administered 2013-11-24: 10 mg via INTRAMUSCULAR

## 2013-11-24 MED ORDER — DIPHENHYDRAMINE HCL 50 MG/ML IJ SOLN
25.0000 mg | Freq: Once | INTRAMUSCULAR | Status: AC
Start: 2013-11-24 — End: 2013-11-24
  Administered 2013-11-24: 25 mg via INTRAMUSCULAR

## 2013-11-24 MED ORDER — METOCLOPRAMIDE HCL 5 MG/ML IJ SOLN
INTRAMUSCULAR | Status: AC
  Filled 2013-11-24: qty 2

## 2013-11-24 MED ORDER — DIPHENHYDRAMINE HCL 50 MG/ML IJ SOLN
INTRAMUSCULAR | Status: AC
  Filled 2013-11-24: qty 1

## 2013-11-24 MED ORDER — KETOROLAC TROMETHAMINE 60 MG/2ML IM SOLN
INTRAMUSCULAR | Status: AC
  Filled 2013-11-24: qty 2

## 2013-11-24 NOTE — ED Notes (Signed)
Headache, foot/ankle swelling.  Onset 4 days ago.

## 2013-11-24 NOTE — ED Provider Notes (Signed)
Chief Complaint   No chief complaint on file.   History of Present Illness   Victoria Campos is a 63 year old female with high blood pressure who has had a four-day history of a generalized, throbbing headache with photophobia, phonophobia, and nausea. She denies any vomiting. She's had no fever or chills, no URI symptoms. She denies any neck stiffness or neck injury. There's been no head injury. She's not on anticoagulants. She denies any eye pain, jaw claudication, or history of blood clots, phlebitis, or clotting disorders. She's had a history of migraine headaches in the past. Usually she has to get an injection for these. This feels like a typical migraine headache. This no worse than usual. She denies that this is the worst headache of her life. She's had no diplopia or blurry vision. She denies any numbness or tingling, no muscle weakness, no difficulty speaking, swallowing, ambulating, or trouble with equilibrium, balance, or dizziness. She's had no shortness of breath or chest pain.  Review of Systems   Other than as noted above, the patient denies any of the following symptoms: Systemic:  No fever, chills, photophobia, or stiff neck. Eye:  No blurred vision, or diplopia. ENT:  No nasal congestion, rhinorrhea, sinus pressure or pain, or sore throat.  No jaw claudication. Neuro:  No paresthesias, loss of consciousness, seizure activity, muscle weakness, trouble with coordination or gait, trouble speaking or swallowing. Psych:  No depression, anxiety or trouble sleeping.  [place]   Past medical history, family history, social history, meds, and allergies were reviewed.    Physical Examination    Vital signs:  BP 152/111  Pulse 76  Temp(Src) 98.2 F (36.8 C) (Oral)  Resp 16  SpO2 96% General:  Alert and oriented.  In no distress. Eye:  Lids and conjunctivas normal.  PERRL,  Full EOMs.  Fundi benign with normal discs and vessels. There was no papilledema.  ENT:  No cranial or  facial tenderness to palpation.  TMs and canals clear.  Nasal mucosa was normal and uncongested without any drainage. No intra oral lesions, pharynx clear, mucous membranes moist, dentition normal. Neck:  Supple, full ROM, no tenderness to palpation.  No adenopathy or mass. Neuro:  Alert and orented times 3.  Speech was clear, fluent, and appropriate.  Cranial nerves intact. No pronator drift, muscle strength normal. Finger to nose normal.  DTRs were 2+ and symmetrical.Station and gait were normal.  Romberg's sign was normal.  Able to perform tandem gait well. Psych:  Normal affect.  Course in [facility]     The patient was given the following meds:   Medications  ketorolac (TORADOL) injection 60 mg (not administered)  diphenhydrAMINE (BENADRYL) injection 25 mg (not administered)  metoCLOPramide (REGLAN) injection 10 mg (not administered)   Assessment   The encounter diagnosis was Migraine without aura and without status migrainosus, not intractable.  There is no evidence of subarachnoid hemorrhage, meningitis, encephalitis, subdural hematoma, epidural hematoma, acute glaucoma, carotid dissection, temporal arteritis, cavernous sinus thrombosis, carbon monoxide toxicity, or pseudotumor cerebri.    Plan   1.  Meds:  The following meds were prescribed:   New Prescriptions   No medications on file    2.  Patient Education/Counseling:  The patient was given appropriate handouts, self care instructions, and instructed in pain control.  I suggested followup on her elevated blood pressure. Repeat blood pressure by me was 170/90.  3.  Follow up:  The patient was told to follow up here if  no better in 2 to 3 days, or sooner if becoming worse in any way, and given some red flag symptoms such as fever, worsening pain, persistent vomiting, or new neurological symptoms which would prompt immediate return.  See her primary care physician within the next week for her blood  pressure.     Harden Mo, MD 11/24/13 [phone]

## 2013-11-24 NOTE — Discharge Instructions (Signed)

## 2013-11-25 ENCOUNTER — Ambulatory Visit: Payer: No Typology Code available for payment source | Admitting: Internal Medicine

## 2013-12-07 ENCOUNTER — Ambulatory Visit: Payer: No Typology Code available for payment source | Admitting: Internal Medicine

## 2013-12-22 ENCOUNTER — Other Ambulatory Visit (HOSPITAL_COMMUNITY): Payer: Self-pay | Admitting: Internal Medicine

## 2013-12-22 DIAGNOSIS — Z1231 Encounter for screening mammogram for malignant neoplasm of breast: Secondary | ICD-10-CM

## 2013-12-30 ENCOUNTER — Encounter: Payer: Self-pay | Admitting: Internal Medicine

## 2014-01-01 ENCOUNTER — Ambulatory Visit (HOSPITAL_COMMUNITY): Payer: No Typology Code available for payment source

## 2014-01-06 ENCOUNTER — Ambulatory Visit (HOSPITAL_COMMUNITY): Payer: No Typology Code available for payment source | Attending: Internal Medicine

## 2014-01-27 ENCOUNTER — Ambulatory Visit (INDEPENDENT_AMBULATORY_CARE_PROVIDER_SITE_OTHER): Payer: Commercial Managed Care - HMO | Admitting: Internal Medicine

## 2014-01-27 VITALS — BP 143/77 | HR 65 | Temp 98.2°F | Wt 182.7 lb

## 2014-01-27 DIAGNOSIS — I10 Essential (primary) hypertension: Secondary | ICD-10-CM

## 2014-01-27 DIAGNOSIS — M47815 Spondylosis without myelopathy or radiculopathy, thoracolumbar region: Secondary | ICD-10-CM | POA: Insufficient documentation

## 2014-01-27 DIAGNOSIS — M479 Spondylosis, unspecified: Secondary | ICD-10-CM | POA: Insufficient documentation

## 2014-01-27 DIAGNOSIS — E785 Hyperlipidemia, unspecified: Secondary | ICD-10-CM

## 2014-01-27 DIAGNOSIS — M47896 Other spondylosis, lumbar region: Secondary | ICD-10-CM

## 2014-01-27 DIAGNOSIS — M47819 Spondylosis without myelopathy or radiculopathy, site unspecified: Secondary | ICD-10-CM

## 2014-01-27 DIAGNOSIS — M47892 Other spondylosis, cervical region: Secondary | ICD-10-CM

## 2014-01-27 DIAGNOSIS — Z0000 Encounter for general adult medical examination without abnormal findings: Secondary | ICD-10-CM

## 2014-01-27 DIAGNOSIS — Z23 Encounter for immunization: Secondary | ICD-10-CM

## 2014-01-27 LAB — CBC
HCT: 36.3 % (ref 36.0–46.0)
HEMOGLOBIN: 12.5 g/dL (ref 12.0–15.0)
MCH: 28.4 pg (ref 26.0–34.0)
MCHC: 34.4 g/dL (ref 30.0–36.0)
MCV: 82.5 fL (ref 78.0–100.0)
MPV: 9.9 fL (ref 9.4–12.4)
Platelets: 235 10*3/uL (ref 150–400)
RBC: 4.4 MIL/uL (ref 3.87–5.11)
RDW: 13.9 % (ref 11.5–15.5)
WBC: 3.6 10*3/uL — AB (ref 4.0–10.5)

## 2014-01-27 LAB — LIPID PANEL
CHOL/HDL RATIO: 3.5 ratio
CHOLESTEROL: 159 mg/dL (ref 0–200)
HDL: 46 mg/dL (ref >39)
LDL Cholesterol: 96 mg/dL (ref 0–99)
TRIGLYCERIDES: 87 mg/dL (ref <150)
VLDL: 17 mg/dL (ref 0–40)

## 2014-01-27 LAB — BASIC METABOLIC PANEL
BUN: 8 mg/dL (ref 6–23)
CHLORIDE: 104 meq/L (ref 96–112)
CO2: 25 mEq/L (ref 19–32)
CREATININE: 0.73 mg/dL (ref 0.50–1.10)
Calcium: 8.8 mg/dL (ref 8.4–10.5)
Glucose, Bld: 107 mg/dL — ABNORMAL HIGH (ref 70–99)
Potassium: 3.8 mEq/L (ref 3.5–5.3)
Sodium: 138 mEq/L (ref 135–145)

## 2014-01-27 MED ORDER — METOPROLOL TARTRATE 50 MG PO TABS: 50.0000 mg | ORAL_TABLET | Freq: Two times a day (BID) | ORAL | Status: DC

## 2014-01-27 MED ORDER — ATORVASTATIN CALCIUM 80 MG PO TABS: 80.0000 mg | ORAL_TABLET | Freq: Every day | ORAL | Status: DC
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
## 2014-01-27 NOTE — Assessment & Plan Note (Signed)
BP Readings from Last 3 Encounters:  01/27/14 143/77  11/24/13 152/111  09/17/13 151/83    Lab Results  Component Value Date   NA 141 02/27/2013   K 3.6 02/27/2013   CREATININE 0.80 02/27/2013    Assessment: Blood pressure control: controlled Progress toward BP goal:  unchanged Comments: patient has been compliant with her medications.   Plan: Medications:  continue current medications: hydrochlorothiazide 12.5 mg, metoprolol 50 mg twice a day, amlodipine 5 mg daily. Patient also due for a repeat basic metabolic panel today.

## 2014-01-27 NOTE — Patient Instructions (Signed)
Please continue taking the tylenol (acetaminophen, [employer] Arthritis pills) for your back pain. We have also refilled your blood pressure medications and cholesterol medication.   General Instructions:   Please bring your medicines with you each time you come to clinic.  Medicines may include prescription medications, over-the-counter medications, herbal remedies, eye drops, vitamins, or other pills.   Progress Toward Treatment Goals:  Treatment Goal 01/27/2014  Blood pressure unchanged  Prevent falls -    Self Care Goals & Plans:  Self Care Goal 06/01/2013  Manage my medications take my medicines as prescribed; bring my medications to every visit; refill my medications on time; follow the sick day instructions if I am sick  Monitor my health keep track of my blood pressure; keep track of my weight  Eat healthy foods eat more vegetables; eat fruit for snacks and desserts; eat baked foods instead of fried foods  Be physically active find an activity I enjoy  Meeting treatment goals -    No flowsheet data found.   Care Management & Community Referrals:  Referral 03/20/2013  Referrals made for care management support none needed

## 2014-01-27 NOTE — Progress Notes (Signed)
   Subjective:    Patient ID: Victoria Campos, female    DOB: [birth year], 63 y.o.   MRN: [id number]  HPI  Patient is a 63 year old female with history of hypertension, BPPV, anxiety/depression who presents with several month history of back pain and medication refill.   Please refer to separate problem-list charting for more details.  Review of Systems  Constitutional: Negative for fever and chills.  HENT: Negative for rhinorrhea and sore throat.   Eyes: Negative for visual disturbance.  Respiratory: Negative for cough and shortness of breath.   Cardiovascular: Negative for chest pain and palpitations.  Gastrointestinal: Negative for nausea, vomiting, abdominal pain, diarrhea, constipation and blood in stool.  Genitourinary: Negative for dysuria and hematuria.  Musculoskeletal: Positive for back pain.  Neurological: Negative for syncope, weakness and numbness.      Objective:   Physical Exam  Constitutional: She is oriented to person, place, and time. She appears well-developed and well-nourished. No distress.  HENT:  Head: Normocephalic and atraumatic.  Eyes: EOM are normal. Pupils are equal, round, and reactive to light. Left eye exhibits no discharge.  Neck: Normal range of motion. Neck supple. No thyromegaly present.  Cardiovascular: Normal rate and regular rhythm.  Exam reveals no gallop and no friction rub.   No murmur heard. Pulmonary/Chest: Effort normal and breath sounds normal. No respiratory distress. She has no wheezes. She has no rales.  Abdominal: Soft. Bowel sounds are normal. She exhibits no distension. There is no tenderness. There is no rebound.  Musculoskeletal: She exhibits no edema.  Mild tenderness along the right paraspinal area in the lumbar back.  Neurological: She is alert and oriented to person, place, and time. No cranial nerve deficit.  Skin: Skin is warm and dry. No rash noted.  Psychiatric: She has a normal mood and affect. Thought content normal.       Assessment & Plan:  Please refer to separate problem-list charting for more details.

## 2014-01-27 NOTE — Assessment & Plan Note (Addendum)
Patient is on atorvastatin 80 mg daily. Patient due for a lipid panel today.  Addendum 01/28/14: Lipid Panel     Component Value Date/Time   CHOL 159 01/27/2014 1019   TRIG 87 01/27/2014 1019   HDL 46 01/27/2014 1019   CHOLHDL 3.5 01/27/2014 1019   VLDL 17 01/27/2014 1019   LDLCALC 96 01/27/2014 1019    Patient's cholesterol is well controlled on atorvastatin 80 mg. Patient has a low ASCVD risk of 5% currently. However, lipids not well controlled on previous therapy including rosuvastatin 10 and 20 mg.  - Continue patient on atorvastatin 80 mg.

## 2014-01-27 NOTE — Progress Notes (Signed)
Internal Medicine Clinic Attending  I saw and evaluated the patient.  I personally confirmed the key portions of the history and exam documented by Dr. Raelene Bott and I reviewed pertinent patient test results.  The assessment, diagnosis, and plan were formulated together and I agree with the documentation in the resident's note.

## 2014-01-27 NOTE — Assessment & Plan Note (Addendum)
Patient due for flu vaccine today. Previous documentation indicating that patient is due for a Pap smear, however patient is 63 years old and has had regular Pap smears in the past. No indication for Pap Smears moving forward

## 2014-02-02 IMAGING — CR DG CHEST 2V
2 series · 2 of 2 positions shown · non-contrast
Comparison: 09/06/2011

CLINICAL DATA: Cough.  Right-sided chest pain.

CHEST - 2 VIEW

[w chest pa]
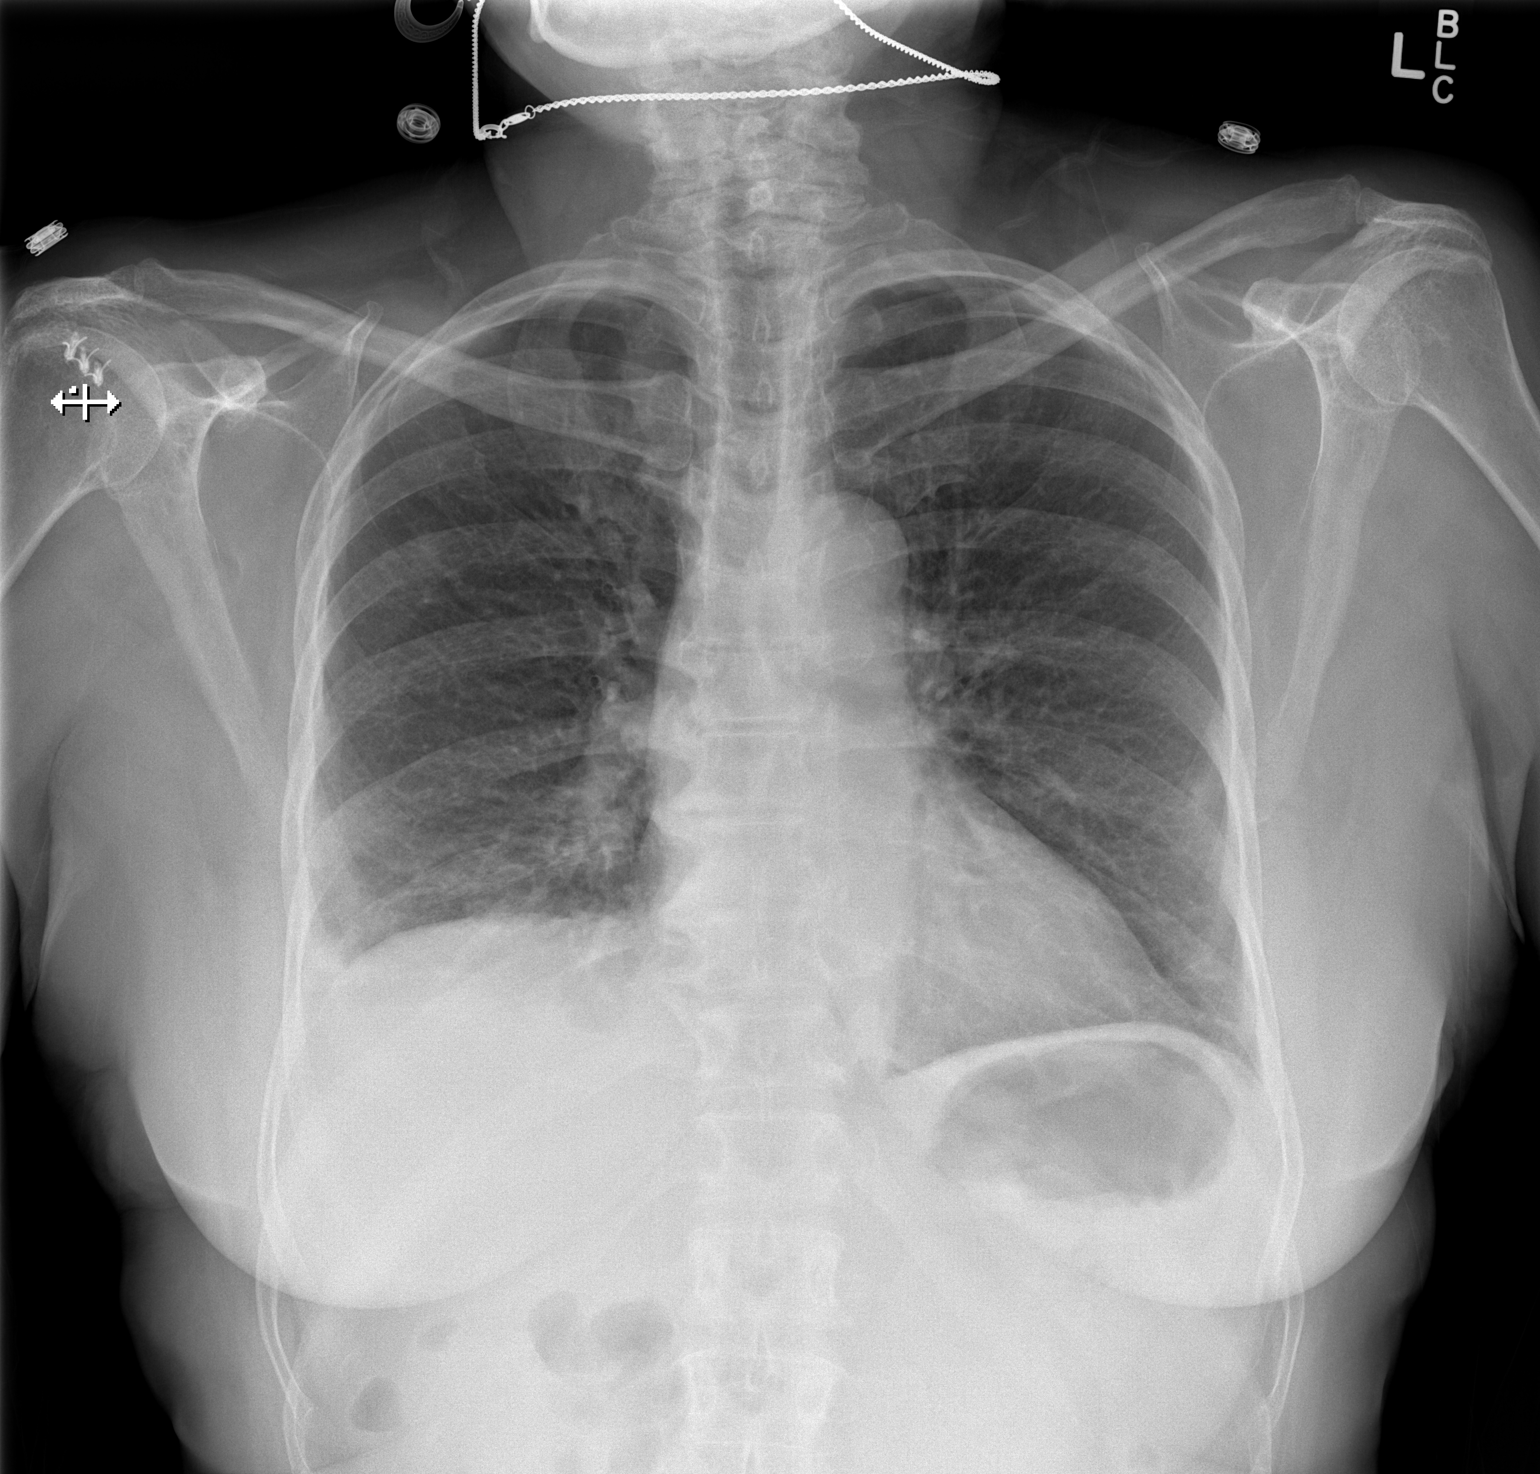

[w chest lat]
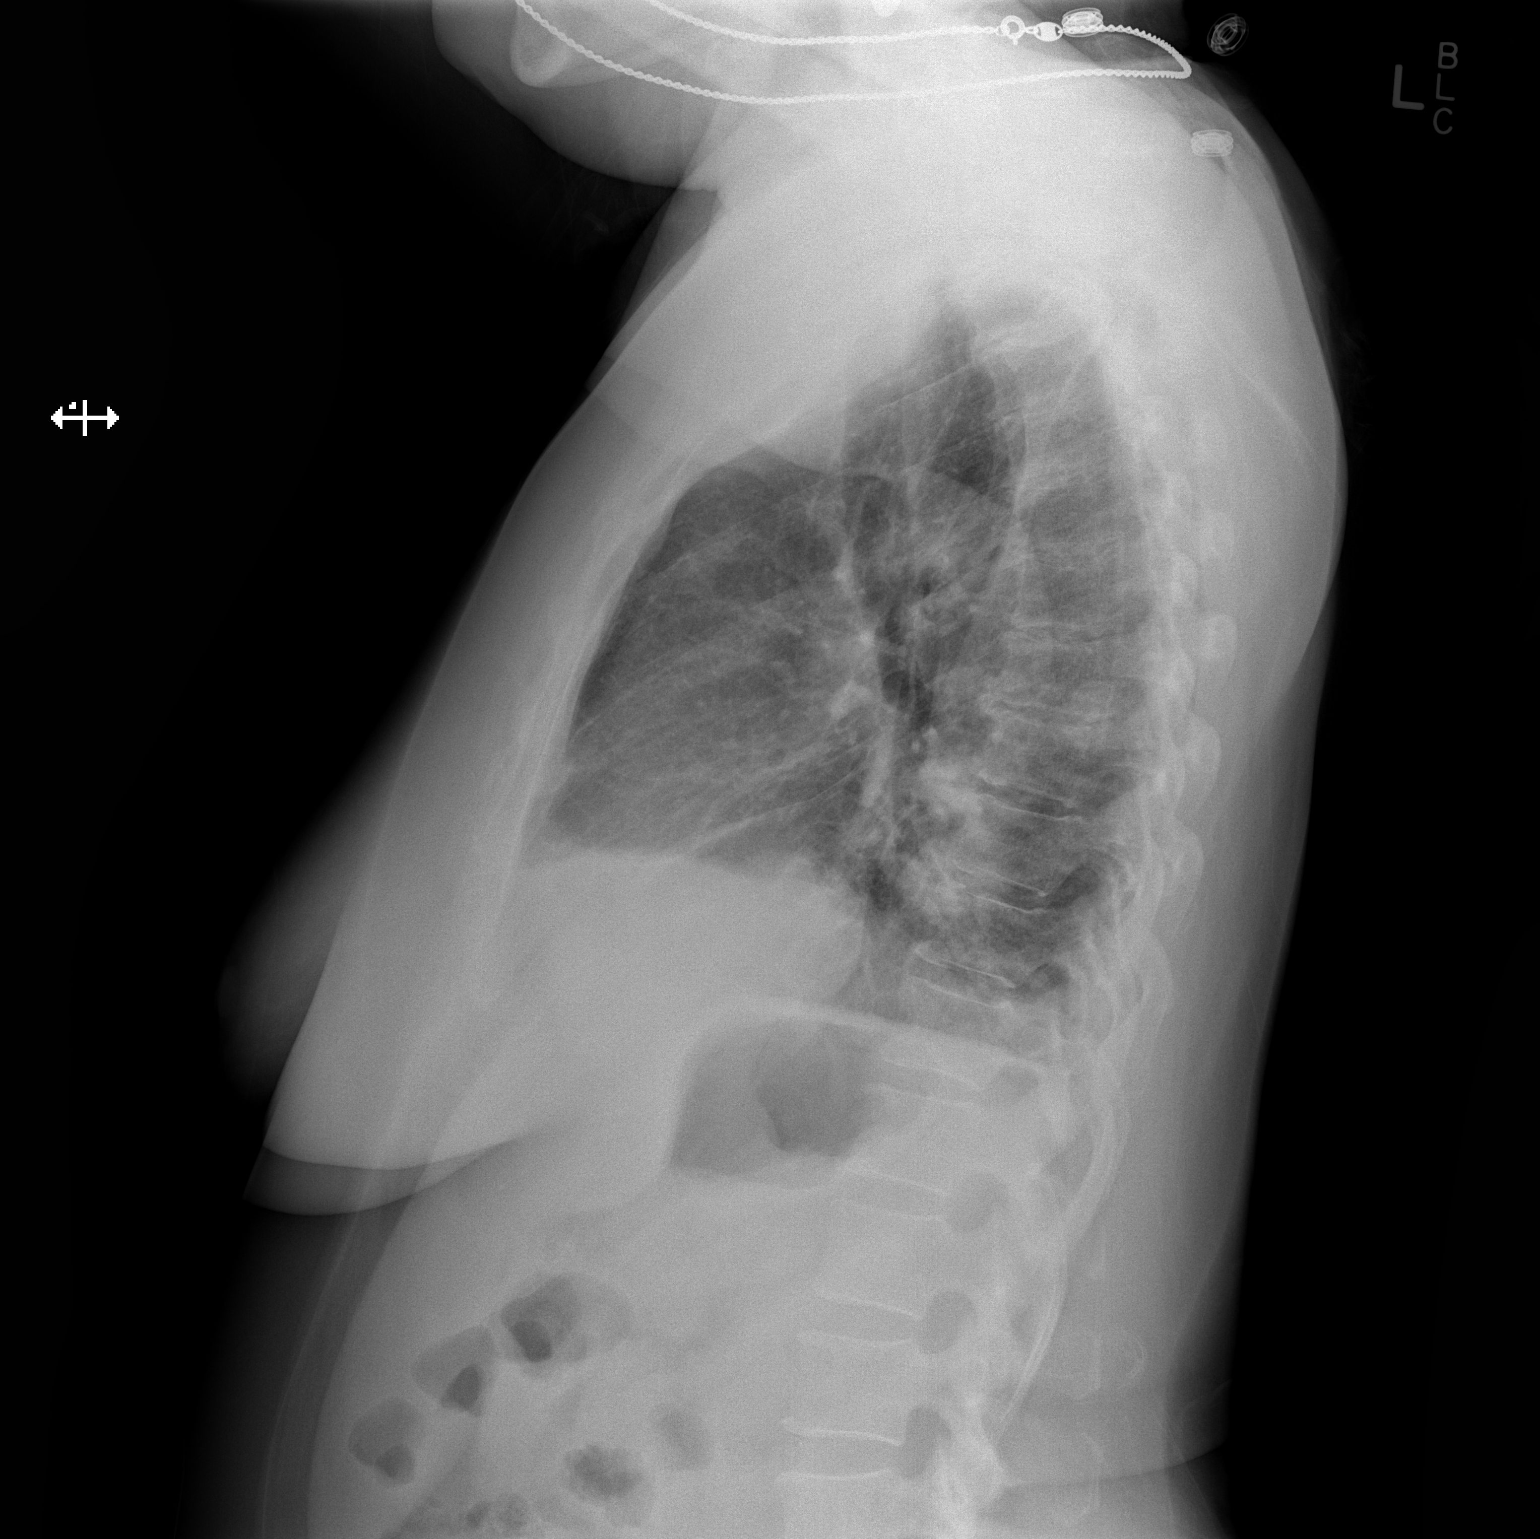

[2 of 2 positions shown; findings below may reference images not displayed]

FINDINGS: Mild right lower lobe infiltrate is new since previous
study, and suspicious for pneumonia.  No evidence of pleural
effusion.  Left lung is clear.  Heart size is stable and within
normal limits.  No mass or lymphadenopathy identified.
IMPRESSION: Mild right lower lobe infiltrate, suspicious for pneumonia. Post-
treatment  radiographic followup recommended to confirm resolution.

## 2014-02-08 ENCOUNTER — Encounter (HOSPITAL_COMMUNITY): Payer: Self-pay | Admitting: *Deleted

## 2014-02-08 ENCOUNTER — Emergency Department (HOSPITAL_COMMUNITY)
Admission: EM | Admit: 2014-02-08 | Discharge: 2014-02-08 | Disposition: A | Discharge status: Home / Self Care | Payer: Commercial Managed Care - HMO | Source: Home / Self Care | Attending: Family Medicine | Admitting: Family Medicine

## 2014-02-08 DIAGNOSIS — G43009 Migraine without aura, not intractable, without status migrainosus: Secondary | ICD-10-CM

## 2014-02-08 MED ORDER — KETOROLAC TROMETHAMINE 60 MG/2ML IM SOLN
INTRAMUSCULAR | Status: AC
  Filled 2014-02-08: qty 2

## 2014-02-08 MED ORDER — DIPHENHYDRAMINE HCL 50 MG/ML IJ SOLN
INTRAMUSCULAR | Status: AC
  Filled 2014-02-08: qty 1

## 2014-02-08 MED ORDER — DIPHENHYDRAMINE HCL 50 MG/ML IJ SOLN: 50.0000 mg | Freq: Once | INTRAMUSCULAR | Status: DC

## 2014-02-08 MED ORDER — ONDANSETRON 4 MG PO TBDP
ORAL_TABLET | ORAL | Status: AC
  Filled 2014-02-08: qty 1

## 2014-02-08 MED ORDER — DIPHENHYDRAMINE HCL 50 MG/ML IJ SOLN
25.0000 mg | Freq: Once | INTRAMUSCULAR | Status: AC
  Administered 2014-02-08: 25 mg via INTRAMUSCULAR

## 2014-02-08 MED ORDER — ONDANSETRON 4 MG PO TBDP
4.0000 mg | ORAL_TABLET | Freq: Once | ORAL | Status: AC
  Administered 2014-02-08: 4 mg via ORAL

## 2014-02-08 MED ORDER — KETOROLAC TROMETHAMINE 60 MG/2ML IM SOLN
45.0000 mg | Freq: Once | INTRAMUSCULAR | Status: AC
  Administered 2014-02-08: 45 mg via INTRAMUSCULAR

## 2014-02-08 NOTE — Discharge Instructions (Signed)
Recurrent Migraine Headache °A migraine headache is very bad, throbbing pain on one or both sides of your head. Recurrent migraines keep coming back. Talk to your doctor about what things may bring on (trigger) your migraine headaches. °HOME CARE °· Only take medicines as told by your doctor. °· Lie down in a dark, quiet room when you have a migraine. °· Keep a journal to find out if certain things bring on migraine headaches. For example, write down: °¨ What you eat and drink. °¨ How much sleep you get. °¨ Any change to your diet or medicines. °· Lessen how much alcohol you drink. °· Quit smoking if you smoke. °· Get enough sleep. °· Lessen any stress in your life. °· Keep lights dim if bright lights bother you or make your migraines worse. °GET HELP IF: °· Medicine does not help your migraines. °· Your pain keeps coming back. °· You have a fever. °GET HELP RIGHT AWAY IF:  °· Your migraine becomes really bad. °· You have a stiff neck. °· You have trouble seeing. °· Your muscles are weak, or you lose muscle control. °· You lose your balance or have trouble walking. °· You feel like you will pass out (faint), or you pass out. °· You have really bad symptoms that are different than your first symptoms. °MAKE SURE YOU:  °· Understand these instructions. °· Will watch your condition. °· Will get help right away if you are not doing well or get worse. °Document Released: 12/06/2007 Document Revised: 03/03/2013 Document Reviewed: 11/03/2012 °[employer]® Patient Information ©2015 [employer]. This information is not intended to replace advice given to you by your health care provider. Make sure you discuss any questions you have with your health care provider. ° °

## 2014-02-08 NOTE — ED Provider Notes (Signed)
CSN: [id number]     Arrival date & time 02/08/14  1314 History   First MD Initiated Contact with Patient 02/08/14 1508     Chief Complaint  Patient presents with  . Headache   (Consider location/radiation/quality/duration/timing/severity/associated sxs/prior Treatment) Patient is a 63 y.o. female presenting with headaches. The history is provided by the patient.  Headache Pain location:  Frontal and occipital Quality:  Dull Radiates to:  L neck and R neck Severity currently:  6/10 Onset quality:  Gradual Duration:  1 day Timing:  Constant Progression:  Unchanged Chronicity:  Recurrent Similar to prior headaches: yes   Context: bright light   Relieved by:  None tried Ineffective treatments:  None tried Associated symptoms: nausea, neck pain and photophobia   Associated symptoms: no abdominal pain, no back pain, no blurred vision, no congestion, no diarrhea, no ear pain, no facial pain, no fever, no focal weakness, no loss of balance, no numbness, no paresthesias, no seizures, no sore throat, no swollen glands, no URI, no visual change, no vomiting and no weakness     Past Medical History  Diagnosis Date  . Hypertension   . Hypercholesteremia   . Heart murmur   . Insomnia   . PE (pulmonary embolism)   . Pulmonary embolism 09/23/2011    Acute segmental bilateral segmental PE, Dx 09/23/11 with Pos lupus anticoagulant, neg venous dopplers, echo s R Ht strain    . ANXIETY 03/10/2004    Qualifier: Diagnosis of  By: Radene Ou MD, [place]     Past Surgical History  Procedure Laterality Date  . [place]      removed  . Tubal ligation    . Arm surgery      torn ligamint   Family History  Problem Relation Age of Onset  . Cancer Brother     colon  . Cancer Sister     colon  . Cancer Sister     breast  . Heart attack Father   . Heart disease Father    History  Substance Use Topics  . Smoking status: Former Smoker    Types: Cigarettes    Quit date: 03/12/1968  . Smokeless  tobacco: Never Used  . Alcohol Use: 0.6 oz/week    1 Glasses of wine per week     Comment: occasional   OB History    No data available     Review of Systems  Constitutional: Negative for fever.  HENT: Negative for congestion, ear pain and sore throat.   Eyes: Positive for photophobia. Negative for blurred vision.  Gastrointestinal: Positive for nausea. Negative for vomiting, abdominal pain and diarrhea.  Musculoskeletal: Positive for neck pain. Negative for back pain.  Neurological: Positive for headaches. Negative for tremors, focal weakness, seizures, syncope, facial asymmetry, speech difficulty, numbness, paresthesias and loss of balance.    Allergies  Sulfamethoxazole-trimethoprim  Home Medications   Prior to Admission medications   Medication Sig Start Date End Date Taking? Authorizing Provider  amLODipine (NORVASC) 5 MG tablet Take 1 tablet (5 mg total) by mouth daily. 11/18/13   Bartholomew Crews, MD  aspirin 81 MG chewable tablet Chew 81 mg by mouth daily.    Historical Provider, MD  atorvastatin (LIPITOR) 80 MG tablet Take 1 tablet (80 mg total) by mouth daily. 01/27/14   Luan Moore, MD  cyclobenzaprine (FLEXERIL) 5 MG tablet Take 1 tablet every 8 hours as needed for muscle spasms 06/19/13   Bjorn Pippin, PA-C  fluticasone [facility]) 50 MCG/ACT nasal  spray Place 2 sprays into both nostrils daily. 03/16/13   Dorian Heckle, MD  hydrochlorothiazide (HYDRODIURIL) 12.5 MG tablet Take 1 tablet (12.5 mg total) by mouth daily. 06/01/13   Dorian Heckle, MD  metoprolol (LOPRESSOR) 50 MG tablet Take 1 tablet (50 mg total) by mouth 2 (two) times daily. 01/27/14   Luan Moore, MD   There were no vitals taken for this visit. Physical Exam  Constitutional: She is oriented to person, place, and time. She appears well-developed and well-nourished. No distress.  HENT:  Head: Normocephalic and atraumatic.  Mouth/Throat: Oropharynx is clear and moist. No oropharyngeal exudate.  Eyes:  Conjunctivae and EOM are normal. Pupils are equal, round, and reactive to light.  Neck: Normal range of motion. Neck supple.  Cardiovascular: Normal rate and normal heart sounds.   Pulmonary/Chest: Effort normal and breath sounds normal. No respiratory distress.  Abdominal: Soft. There is no tenderness.  Musculoskeletal: Normal range of motion. She exhibits no edema.  Lymphadenopathy:    She has no cervical adenopathy.  Neurological: She is alert and oriented to person, place, and time. She has normal strength. She displays no tremor. No cranial nerve deficit or sensory deficit. She exhibits normal muscle tone. Coordination and gait normal.  Skin: Skin is warm and dry.  Psychiatric: She has a normal mood and affect.  Nursing note and vitals reviewed.   ED Course  Procedures (including critical care time) Labs Review Labs Reviewed - No data to display  Imaging Review No results found.   MDM   1. Migraine without aura and without status migrainosus, not intractable    toradol 45 mg IM  I-Stat 01/27/14 WNL zofran 4 mg  Benadryl 50 mg IM Call your dr for appt    Janne Napoleon, NP 02/08/14 1531

## 2014-02-08 NOTE — ED Notes (Signed)
Pt  Reports  symptomsof  Headache  And photophobia -pt  Ambulated  To the  Exam  Room she  Is  sitting upright  Speaking in complete  sentances

## 2014-02-12 ENCOUNTER — Encounter: Payer: Self-pay | Admitting: *Deleted

## 2014-02-17 IMAGING — CR DG CHEST 2V
2 series · 2 of 2 positions shown · non-contrast
Comparison: 09/23/2011

CLINICAL DATA: Short of breath

CHEST - 2 VIEW

[w chest pa]
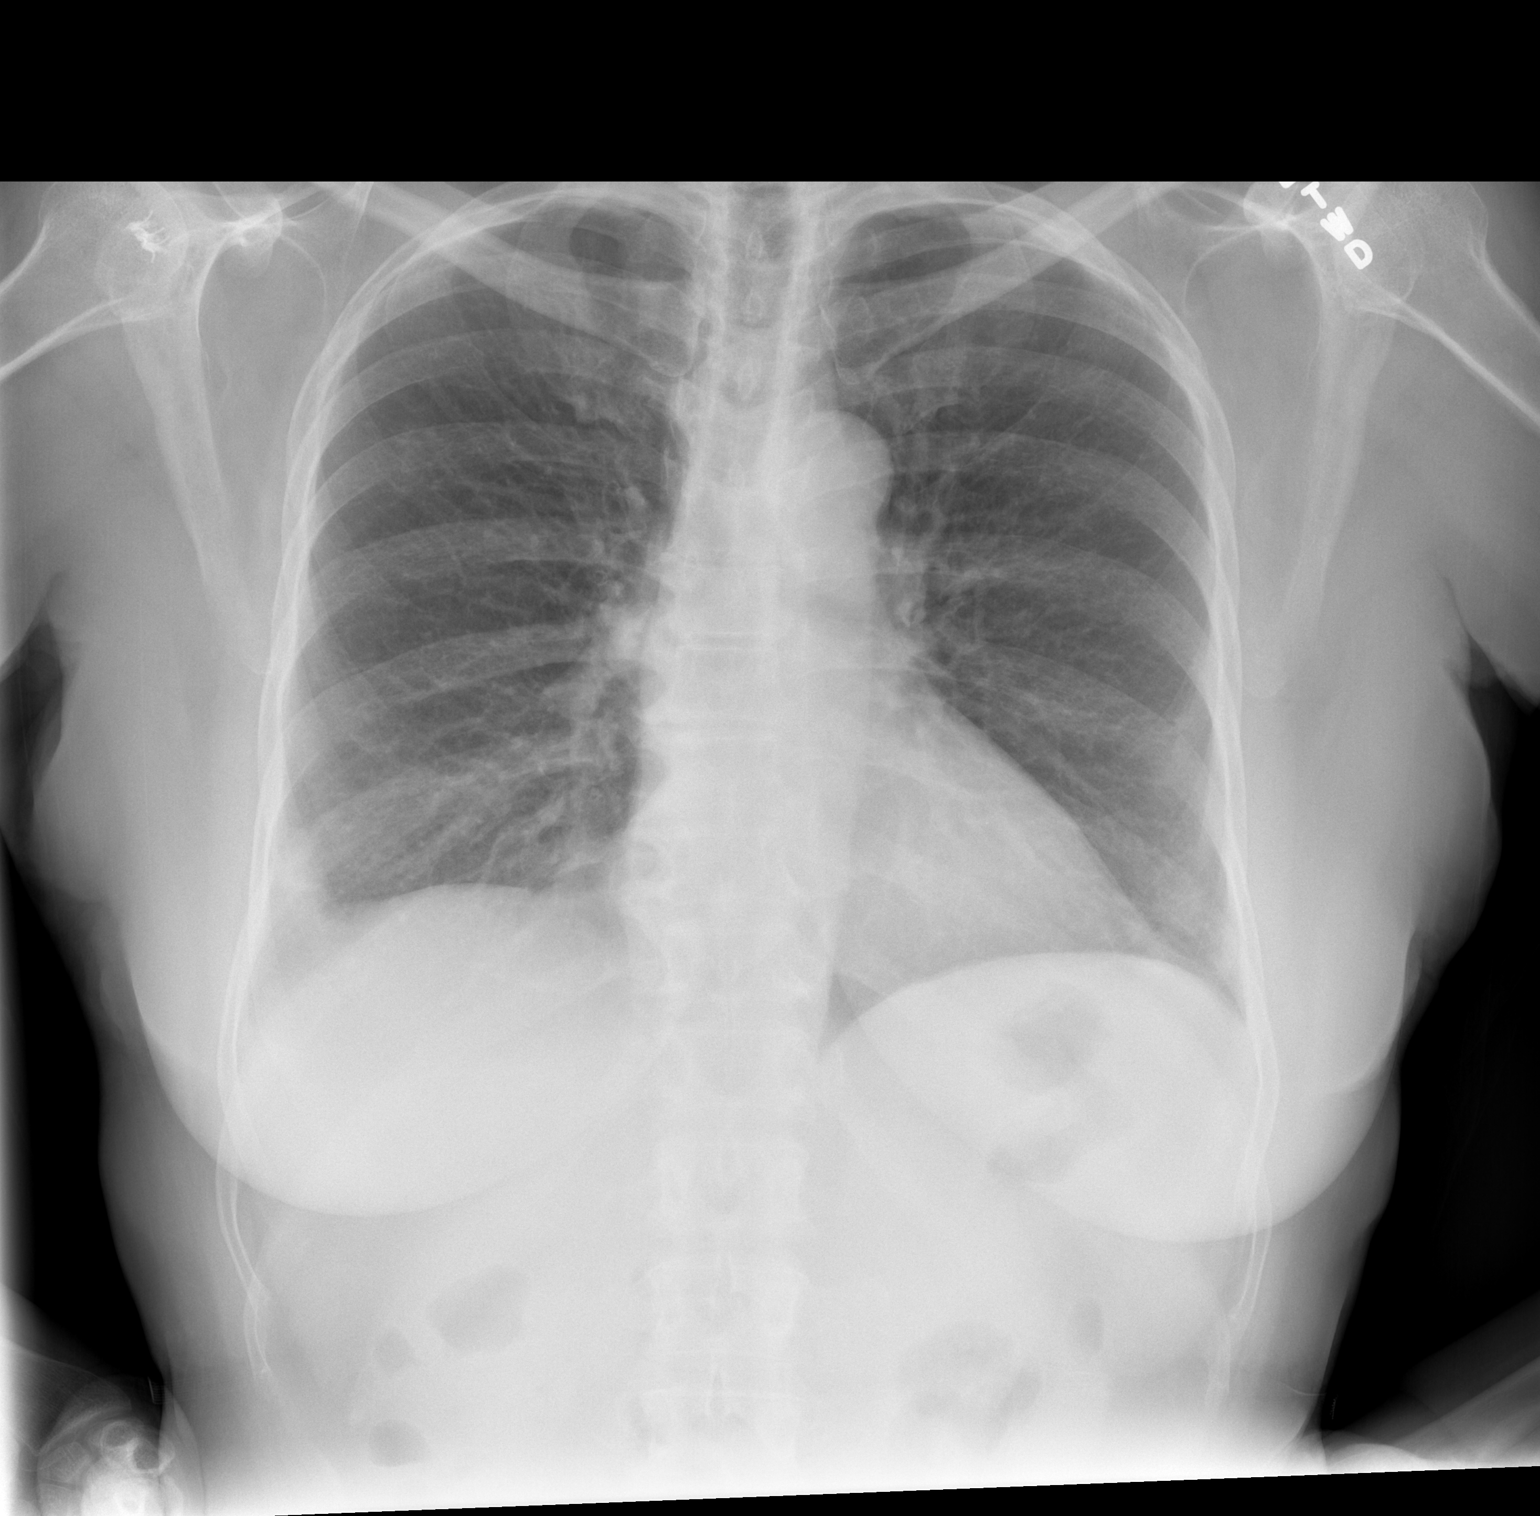

[w chest lat]
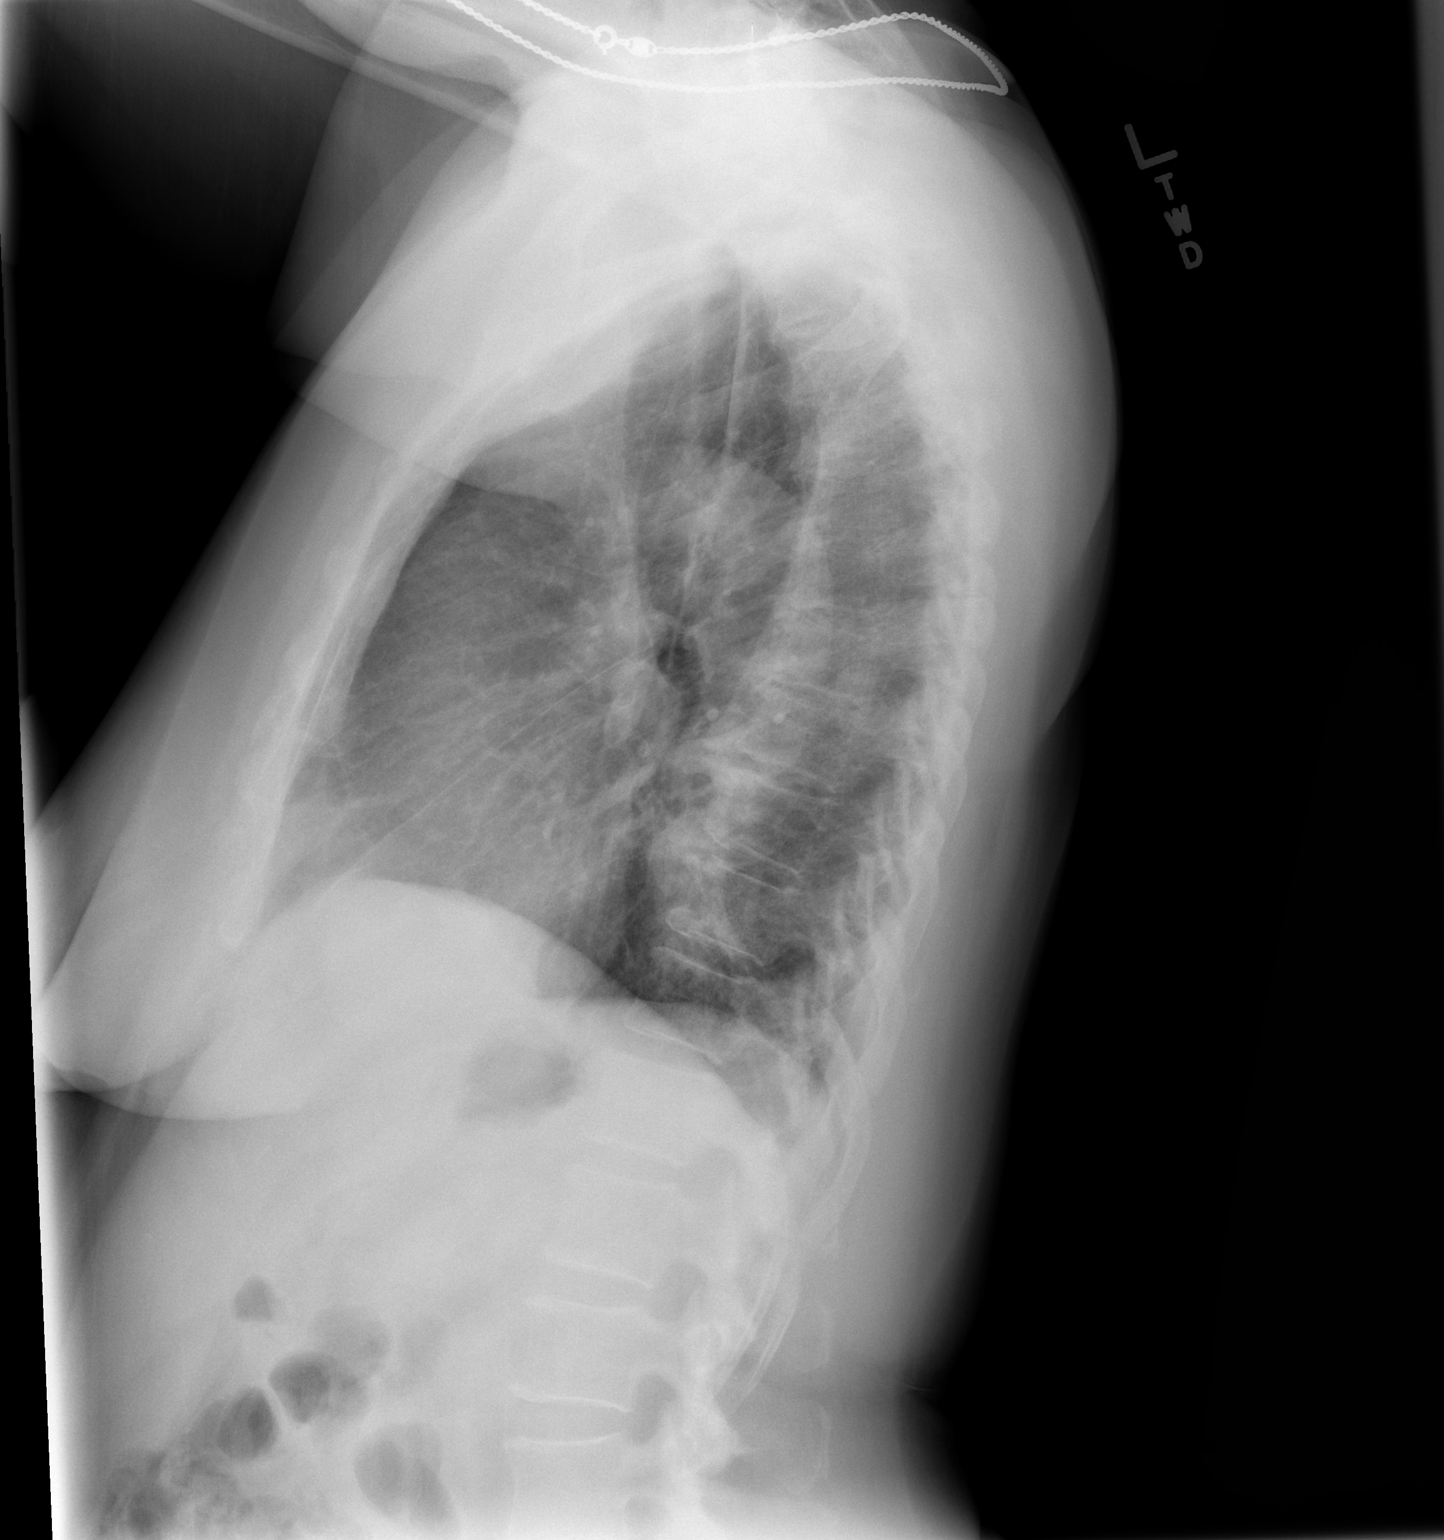

[2 of 2 positions shown; findings below may reference images not displayed]

FINDINGS: Airspace disease at the right costophrenic angle has
slightly increased. Minimal atelectasis at the left base.  Normal
heart size.  Upper lungs clear.  No pneumothorax.
IMPRESSION: Slightly increased right peripheral lateral and basilar opacity.
Developing pulmonary infarct is not excluded.

## 2014-03-15 ENCOUNTER — Encounter: Payer: Commercial Managed Care - HMO | Admitting: Internal Medicine

## 2014-03-22 ENCOUNTER — Encounter: Payer: Commercial Managed Care - HMO | Admitting: Internal Medicine

## 2014-04-12 ENCOUNTER — Ambulatory Visit: Payer: Commercial Managed Care - HMO | Admitting: Internal Medicine

## 2014-04-16 ENCOUNTER — Ambulatory Visit: Payer: Commercial Managed Care - HMO | Admitting: Internal Medicine

## 2014-04-22 ENCOUNTER — Telehealth: Payer: Self-pay | Admitting: Internal Medicine

## 2014-04-22 NOTE — Telephone Encounter (Signed)
Call to patient to confirm appointment for 04/26/14 at 10:15 lmtcb

## 2014-04-26 ENCOUNTER — Ambulatory Visit: Payer: Commercial Managed Care - HMO | Admitting: Internal Medicine

## 2014-05-03 ENCOUNTER — Encounter: Payer: Commercial Managed Care - HMO | Admitting: Internal Medicine

## 2014-05-06 ENCOUNTER — Telehealth: Payer: Self-pay | Admitting: Internal Medicine

## 2014-05-06 NOTE — Telephone Encounter (Signed)
Call to patient to confirm appointment for 05/10/14 at 9:15 lmtcb

## 2014-05-10 ENCOUNTER — Encounter: Payer: Self-pay | Admitting: Internal Medicine

## 2014-05-10 ENCOUNTER — Ambulatory Visit (INDEPENDENT_AMBULATORY_CARE_PROVIDER_SITE_OTHER): Payer: Commercial Managed Care - HMO | Admitting: Internal Medicine

## 2014-05-10 VITALS — BP 146/87 | HR 84 | Temp 98.4°F | Ht 64.5 in | Wt 183.8 lb

## 2014-05-10 DIAGNOSIS — Z0000 Encounter for general adult medical examination without abnormal findings: Secondary | ICD-10-CM

## 2014-05-10 DIAGNOSIS — I10 Essential (primary) hypertension: Secondary | ICD-10-CM | POA: Diagnosis not present

## 2014-05-10 DIAGNOSIS — H6123 Impacted cerumen, bilateral: Secondary | ICD-10-CM | POA: Diagnosis not present

## 2014-05-10 DIAGNOSIS — Z23 Encounter for immunization: Secondary | ICD-10-CM | POA: Diagnosis not present

## 2014-05-10 DIAGNOSIS — Z780 Asymptomatic menopausal state: Secondary | ICD-10-CM

## 2014-05-10 MED ORDER — FLUTICASONE PROPIONATE 50 MCG/ACT NA SUSP: 2.0000 | Freq: Every day | NASAL | Status: DC

## 2014-05-10 NOTE — Addendum Note (Signed)
Addended by: Sander Nephew F on: 05/10/2014 10:29 AM   Modules accepted: Orders

## 2014-05-10 NOTE — Assessment & Plan Note (Signed)
-  Prevnar administered today. -DEXA scan ordered

## 2014-05-10 NOTE — Assessment & Plan Note (Signed)
Exam remarkable for moderate cerumen bilaterally. Patient reporting decreased hearing on both sides. -Bilateral ears irrigated with improvement of hearing.

## 2014-05-10 NOTE — Progress Notes (Signed)
INTERNAL MEDICINE TEACHING ATTENDING ADDENDUM - Chao Blazejewski, MD: I reviewed and discussed at the time of visit with the resident Dr. Ngo, the patient's medical history, physical examination, diagnosis and results of pertinent tests and treatment and I agree with the patient's care as documented.  

## 2014-05-10 NOTE — Patient Instructions (Addendum)
Congratulations on your 63th birthday. Now that you are 63, you are due for a bone scan as well as a pneumonia vaccine. We have given you the vaccine here in clinic today. We will be in contact with you regarding the scheduling for your bone scan.  I have sent a refill for your Flonase.  Please continue doing a good job of controlling her blood pressure and taking all your medications as directed.  General Instructions:   Thank you for bringing your medicines today. This helps [place] keep you safe from mistakes.   Progress Toward Treatment Goals:  Treatment Goal 05/10/2014  Blood pressure unchanged  Prevent falls -    Self Care Goals & Plans:  Self Care Goal 05/10/2014  Manage my medications take my medicines as prescribed; bring my medications to every visit; refill my medications on time  Monitor my health -  Eat healthy foods drink diet soda or water instead of juice or soda; eat more vegetables; eat foods that are low in salt; eat baked foods instead of fried foods  Be physically active -  Meeting treatment goals -    No flowsheet data found.   Care Management & Community Referrals:  Referral 03/20/2013  Referrals made for care management support none needed

## 2014-05-10 NOTE — Progress Notes (Signed)
   Subjective:    Patient ID: Victoria Campos, female    DOB: 06-Jun-1950, 64 y.o.   MRN: [id number]  HPI  Patient is a 64 year old with history of hyperlipidemia, hypertension, pulmonary embolism, anxiety and depression, who presents to clinic for a routine follow-up and ear canal irrigation.  Please refer to separate problem-list charting for more details.  Review of Systems  Constitutional: Negative for fever and chills.  HENT: Negative for rhinorrhea and sore throat.   Eyes: Negative for visual disturbance.  Respiratory: Negative for cough and shortness of breath.   Cardiovascular: Negative for chest pain and palpitations.  Gastrointestinal: Negative for nausea, vomiting, abdominal pain, diarrhea, constipation and blood in stool.  Genitourinary: Negative for dysuria and hematuria.  Neurological: Negative for syncope.       Objective:   Physical Exam  Constitutional: She is oriented to person, place, and time. She appears well-developed and well-nourished. No distress.  HENT:  Head: Normocephalic and atraumatic.  Bilateral ear canals with moderate cerumen.  Eyes: EOM are normal. Pupils are equal, round, and reactive to light. Left eye exhibits no discharge.  Neck: Normal range of motion. Neck supple. No thyromegaly present.  Cardiovascular: Normal rate and regular rhythm.  Exam reveals no gallop and no friction rub.   No murmur heard. Pulmonary/Chest: Effort normal and breath sounds normal. No respiratory distress. She has no wheezes. She has no rales.  Abdominal: Soft. Bowel sounds are normal. She exhibits no distension. There is no tenderness. There is no rebound.  Musculoskeletal: She exhibits no edema.  Neurological: She is alert and oriented to person, place, and time. No cranial nerve deficit.  Skin: Skin is warm and dry. No rash noted.  Psychiatric: She has a normal mood and affect. Thought content normal.          Assessment & Plan:  Please refer to separate  problem-list charting for more details.

## 2014-05-10 NOTE — Assessment & Plan Note (Signed)
BP Readings from Last 3 Encounters:  05/10/14 146/87  01/27/14 143/77  11/24/13 152/111    Lab Results  Component Value Date   NA 138 01/27/2014   K 3.8 01/27/2014   CREATININE 0.73 01/27/2014    Assessment: Blood pressure control: mildly elevated Progress toward BP goal:  unchanged Comments: Apart from this morning, patient states that she is compliant with her hydrochlorothiazide 12.5 mg daily and metoprolol 50 mg twice a day.  Plan: Medications:  continue current medications Educational resources provided: brochure (denies) Self management tools provided:   Other plans: None

## 2014-05-31 ENCOUNTER — Other Ambulatory Visit: Payer: Self-pay | Admitting: Internal Medicine

## 2014-07-20 ENCOUNTER — Encounter: Payer: Self-pay | Admitting: *Deleted

## 2014-08-02 ENCOUNTER — Encounter: Payer: Self-pay | Admitting: Internal Medicine

## 2014-08-02 ENCOUNTER — Ambulatory Visit (INDEPENDENT_AMBULATORY_CARE_PROVIDER_SITE_OTHER): Payer: Commercial Managed Care - HMO | Admitting: Internal Medicine

## 2014-08-02 VITALS — BP 136/66 | HR 66 | Temp 98.5°F | Ht 64.5 in | Wt 187.7 lb

## 2014-08-02 DIAGNOSIS — I10 Essential (primary) hypertension: Secondary | ICD-10-CM

## 2014-08-02 DIAGNOSIS — M7989 Other specified soft tissue disorders: Secondary | ICD-10-CM | POA: Diagnosis not present

## 2014-08-02 DIAGNOSIS — R6889 Other general symptoms and signs: Secondary | ICD-10-CM | POA: Diagnosis not present

## 2014-08-02 LAB — TSH: TSH: 1.099 u[IU]/mL (ref 0.350–4.500)

## 2014-08-02 MED ORDER — IBUPROFEN 400 MG PO TABS: 400.0000 mg | ORAL_TABLET | Freq: Four times a day (QID) | ORAL | Status: DC | PRN

## 2014-08-02 NOTE — Progress Notes (Signed)
   Subjective:    Patient ID: Victoria Campos, female    DOB: 05-10-1950, 64 y.o.   MRN: [id number]  HPI  Patient is a 64 year old female with a history of hypertension, hyperlipidemia, chronic back pain who presents to clinic for left arm swelling.   Please refer to separate problem-list charting for more details.  Review of Systems  Constitutional: Negative for fever and chills.  HENT: Negative for rhinorrhea and sore throat.   Eyes: Negative for visual disturbance.  Respiratory: Negative for cough and shortness of breath.   Cardiovascular: Negative for chest pain and palpitations.  Gastrointestinal: Negative for nausea, vomiting, abdominal pain, diarrhea, constipation and blood in stool.  Genitourinary: Negative for dysuria and hematuria.  Neurological: Negative for syncope.       Objective:   Physical Exam  Constitutional: She is oriented to person, place, and time. She appears well-developed and well-nourished. No distress.  HENT:  Head: Normocephalic and atraumatic.  Eyes: EOM are normal. Pupils are equal, round, and reactive to light.  Neck: Normal range of motion. Neck supple. No thyromegaly present.  Cardiovascular: Normal rate and regular rhythm.  Exam reveals no gallop and no friction rub.   Murmur ( 2/6 systolic) heard. Pulmonary/Chest: Effort normal and breath sounds normal. No respiratory distress. She has no wheezes. She has no rales.  Abdominal: Soft. Bowel sounds are normal. She exhibits no distension. There is no tenderness. There is no rebound.  Musculoskeletal: She exhibits edema ( Left upper extremity proximal to the wrist joint along the dorsal surface, focal area around 6 cm in diameter, pain with passive and active wrist extension, no significant erythema, tenderness, warmth).  Neurological: She is alert and oriented to person, place, and time. No cranial nerve deficit.  Skin: Skin is warm and dry. No rash noted.  Psychiatric: She has a normal mood and  affect. Thought content normal.          Assessment & Plan:  Please refer to separate problem-list charting for more details.

## 2014-08-02 NOTE — Assessment & Plan Note (Addendum)
Patient with a focal area of soft tissue swelling in the dorsal aspect of the left forearm that is not erythematous, tender, or warm. This is associated with moderate pain especially with wrist extension. Patient with no systemic symptoms of fever. No swelling noted anywhere else in the body. Same manifestations noted in 2014 which resulted in further extension of swelling throughout the forearm but resolved spontaneously. Workup at that point including a forearm x-ray, CT with contrast, vascular studies were all unremarkable for any bony abnormalities, DVT. CT showing a nonspecific fluid collection throughout the forearm. At this point, patient has taken Bayer aspirin occasionally with no alleviation of the symptoms. Patient expresses some concern for a blood clots although the focality of the lesion makes this less likely. Overall, etiology is unclear. -ESR to evaluate for inflammatory process --> ESR wnl -Ibuprofen 400 mg every 6 hours when necessary for pain. Trial with NSAIDs for now and assess for response. -Patient to return to clinic if symptoms worsen. Otherwise, return to clinic in one month for recheck.

## 2014-08-02 NOTE — Patient Instructions (Signed)
Please take the ibuprofen that I have prescribed to help with the swelling in your arm. I will notify you of any abnormal lab tests that may return from your visit. Please come back to clinic in one month to reevaluate your arm. We may do more imaging and tests at that point if you're not improved.

## 2014-08-02 NOTE — Assessment & Plan Note (Signed)
BP Readings from Last 3 Encounters:  08/02/14 136/66  05/10/14 146/87  01/27/14 143/77    Lab Results  Component Value Date   NA 138 01/27/2014   K 3.8 01/27/2014   CREATININE 0.73 01/27/2014    Assessment: Blood pressure control: controlled Progress toward BP goal:  at goal Comments: Patient states that she is compliant with her amlodipine 5 mg daily, hydrochlorothiazide 12.5 mg daily, metoprolol 50 mg daily.  Plan: Medications:  continue current medications Educational resources provided: brochure (denies) Self management tools provided:   Other plans: none

## 2014-08-03 DIAGNOSIS — R6889 Other general symptoms and signs: Secondary | ICD-10-CM | POA: Insufficient documentation

## 2014-08-03 LAB — SEDIMENTATION RATE: Sed Rate: 4 mm/hr (ref 0–30)

## 2014-08-03 NOTE — Progress Notes (Signed)
Internal Medicine Clinic Attending  I saw and evaluated the patient.  I personally confirmed the key portions of the history and exam documented by Dr. Raelene Bott and I reviewed pertinent patient test results.  The assessment, diagnosis, and plan were formulated together and I agree with the documentation in the resident's note.

## 2014-08-03 NOTE — Assessment & Plan Note (Signed)
Patient reporting cold intolerance over the last few years. Previous TSHs have been wnl, though last was nearly two years ago.  -Repeat TSH --> TSH wnl.

## 2014-08-10 ENCOUNTER — Other Ambulatory Visit: Payer: Self-pay | Admitting: Internal Medicine

## 2014-09-29 ENCOUNTER — Emergency Department (HOSPITAL_COMMUNITY)
Admission: EM | Admit: 2014-09-29 | Discharge: 2014-09-29 | Disposition: A | Discharge status: Home / Self Care | Payer: Commercial Managed Care - HMO | Attending: Emergency Medicine | Admitting: Emergency Medicine

## 2014-09-29 ENCOUNTER — Encounter (HOSPITAL_COMMUNITY): Payer: Self-pay | Admitting: Emergency Medicine

## 2014-09-29 DIAGNOSIS — Z8659 Personal history of other mental and behavioral disorders: Secondary | ICD-10-CM | POA: Diagnosis not present

## 2014-09-29 DIAGNOSIS — R011 Cardiac murmur, unspecified: Secondary | ICD-10-CM | POA: Diagnosis not present

## 2014-09-29 DIAGNOSIS — Z8669 Personal history of other diseases of the nervous system and sense organs: Secondary | ICD-10-CM | POA: Insufficient documentation

## 2014-09-29 DIAGNOSIS — Z7951 Long term (current) use of inhaled steroids: Secondary | ICD-10-CM | POA: Insufficient documentation

## 2014-09-29 DIAGNOSIS — M2550 Pain in unspecified joint: Secondary | ICD-10-CM

## 2014-09-29 DIAGNOSIS — Z87891 Personal history of nicotine dependence: Secondary | ICD-10-CM | POA: Diagnosis not present

## 2014-09-29 DIAGNOSIS — M25521 Pain in right elbow: Secondary | ICD-10-CM | POA: Diagnosis not present

## 2014-09-29 DIAGNOSIS — M130 Polyarthritis, unspecified: Secondary | ICD-10-CM | POA: Insufficient documentation

## 2014-09-29 DIAGNOSIS — I10 Essential (primary) hypertension: Secondary | ICD-10-CM | POA: Insufficient documentation

## 2014-09-29 DIAGNOSIS — E780 Pure hypercholesterolemia: Secondary | ICD-10-CM | POA: Insufficient documentation

## 2014-09-29 DIAGNOSIS — Z7982 Long term (current) use of aspirin: Secondary | ICD-10-CM | POA: Diagnosis not present

## 2014-09-29 DIAGNOSIS — M25561 Pain in right knee: Secondary | ICD-10-CM | POA: Diagnosis not present

## 2014-09-29 DIAGNOSIS — Z79899 Other long term (current) drug therapy: Secondary | ICD-10-CM | POA: Diagnosis not present

## 2014-09-29 DIAGNOSIS — R52 Pain, unspecified: Secondary | ICD-10-CM | POA: Diagnosis present

## 2014-09-29 DIAGNOSIS — M25511 Pain in right shoulder: Secondary | ICD-10-CM | POA: Diagnosis not present

## 2014-09-29 DIAGNOSIS — M25562 Pain in left knee: Secondary | ICD-10-CM | POA: Diagnosis not present

## 2014-09-29 MED ORDER — KETOROLAC TROMETHAMINE 15 MG/ML IJ SOLN
15.0000 mg | Freq: Once | INTRAMUSCULAR | Status: AC
  Administered 2014-09-29: 15 mg via INTRAVENOUS
  Filled 2014-09-29: qty 1

## 2014-09-29 MED ORDER — MORPHINE SULFATE 4 MG/ML IJ SOLN
6.0000 mg | Freq: Once | INTRAMUSCULAR | Status: AC
  Administered 2014-09-29: 6 mg via INTRAVENOUS
  Filled 2014-09-29: qty 2

## 2014-09-29 MED ORDER — TRAMADOL HCL 50 MG PO TABS: 50.0000 mg | ORAL_TABLET | Freq: Four times a day (QID) | ORAL | Status: DC | PRN

## 2014-09-29 MED ORDER — PREDNISONE 20 MG PO TABS: 40.0000 mg | ORAL_TABLET | Freq: Every day | ORAL | Status: DC

## 2014-09-29 MED ORDER — DEXAMETHASONE SODIUM PHOSPHATE 10 MG/ML IJ SOLN
10.0000 mg | Freq: Once | INTRAMUSCULAR | Status: AC
  Administered 2014-09-29: 10 mg via INTRAVENOUS
  Filled 2014-09-29: qty 1

## 2014-09-29 NOTE — ED Provider Notes (Signed)
CSN: [id number]     Arrival date & time 09/29/14  1056 History   First MD Initiated Contact with Patient 09/29/14 1127     Chief Complaint  Patient presents with  . Generalized Body Aches     (Consider location/radiation/quality/duration/timing/severity/associated sxs/prior Treatment) HPI   63yF with pain in both knees, R shoulder and R elbow. Pains for about the past month. Worse in last few says. No fever or chills. No rash. Some swelling in both legs. No respiratory complaints. Denies any trauma. Feels stiff all over.   Past Medical History  Diagnosis Date  . Hypertension   . Hypercholesteremia   . Heart murmur   . Insomnia   . PE (pulmonary embolism)   . Pulmonary embolism 09/23/2011    Acute segmental bilateral segmental PE, Dx 09/23/11 with Pos lupus anticoagulant, neg venous dopplers, echo s R Ht strain    . ANXIETY 03/10/2004    Qualifier: Diagnosis of  By: Radene Ou MD, [place]     Past Surgical History  Procedure Laterality Date  . [place]      removed  . Tubal ligation    . Arm surgery      torn ligamint   Family History  Problem Relation Age of Onset  . Cancer Brother     colon  . Cancer Sister     colon  . Cancer Sister     breast  . Heart attack Father   . Heart disease Father    History  Substance Use Topics  . Smoking status: Former Smoker    Types: Cigarettes    Quit date: 03/12/1968  . Smokeless tobacco: Never Used  . Alcohol Use: 0.6 oz/week    1 Glasses of wine per week     Comment: occasional   OB History    No data available     Review of Systems  All systems reviewed and negative, other than as noted in HPI.   Allergies  Sulfamethoxazole-trimethoprim  Home Medications   Prior to Admission medications   Medication Sig Start Date End Date Taking? Authorizing Provider  amLODipine (NORVASC) 5 MG tablet Take 1 tablet (5 mg total) by mouth daily. 11/18/13   Bartholomew Crews, MD  aspirin 81 MG chewable tablet Chew 81 mg by mouth  daily.    Historical Provider, MD  atorvastatin (LIPITOR) 80 MG tablet Take 1 tablet (80 mg total) by mouth daily. 01/27/14   Luan Moore, MD  cyclobenzaprine (FLEXERIL) 5 MG tablet Take 1 tablet every 8 hours as needed for muscle spasms 06/19/13   Bjorn Pippin, PA-C  fluticasone [facility]) 50 MCG/ACT nasal spray Place 2 sprays into both nostrils daily. 05/10/14   Luan Moore, MD  hydrochlorothiazide (HYDRODIURIL) 12.5 MG tablet Take 1 tablet (12.5 mg total) by mouth daily. 06/01/13   Dorian Heckle, MD  ibuprofen (ADVIL,MOTRIN) 400 MG tablet Take 1 tablet (400 mg total) by mouth every 6 (six) hours as needed. 08/02/14   Luan Moore, MD  metoprolol (LOPRESSOR) 50 MG tablet TAKE ONE TABLET BY MOUTH TWICE DAILY 08/11/14   Luan Moore, MD   BP 131/79 mmHg  Pulse 59  Temp(Src) 98.5 F (36.9 C) (Oral)  Resp 18  SpO2 98% Physical Exam  Constitutional: She appears well-developed and well-nourished. No distress.  HENT:  Head: Normocephalic and atraumatic.  Eyes: Conjunctivae are normal. Right eye exhibits no discharge. Left eye exhibits no discharge.  Neck: Neck supple.  Cardiovascular: Normal rate, regular rhythm and normal heart sounds.  Exam reveals no gallop and no friction rub.   No murmur heard. Pulmonary/Chest: Effort normal and breath sounds normal. No respiratory distress.  Abdominal: Soft. She exhibits no distension. There is no tenderness.  Musculoskeletal: She exhibits edema. She exhibits no tenderness.  Mild symmetric LE edema. Able to fully range all large joints actively. No effusion appreciated. No rash. NVI.   Neurological: She is alert.  Skin: Skin is warm and dry.  Psychiatric: She has a normal mood and affect. Her behavior is normal. Thought content normal.  Nursing note and vitals reviewed.   ED Course  Procedures (including critical care time) Labs Review Labs Reviewed - No data to display  Imaging Review No results found.   EKG Interpretation   Date/Time:   Wednesday September 29 2014 11:26:19 EDT Ventricular Rate:  53 PR Interval:  197 QRS Duration: 89 QT Interval:  431 QTC Calculation: 405 R Axis:   -20 Text Interpretation:  Sinus rhythm Anterior infarct, old Confirmed by  Tamorah Hada  MD, Gotham Raden ([id number]) on 09/29/2014 11:32:45 AM      MDM   Final diagnoses:  Polyarthralgia    63yf with pain in multiple joints. No rash. Nothing hot/red on exam. Plan symptomatic tx. On statin. Needs to discuss with PCP if pain persists.     Virgel Manifold, MD 10/11/14 [phone]

## 2014-09-29 NOTE — ED Notes (Addendum)
Pt with Hx of PEs c/o right arm and leg pain and edema x 1 month, generalized body pains x 3 weeks. Pt states that on Friday the reached a level such that she had difficulty getting up off of the floor, and yesterday she began feeling stiff. Pt had PCP appointment but cannot wait.

## 2014-09-29 NOTE — Discharge Instructions (Signed)

## 2014-10-12 ENCOUNTER — Ambulatory Visit (INDEPENDENT_AMBULATORY_CARE_PROVIDER_SITE_OTHER): Payer: Commercial Managed Care - HMO | Admitting: *Deleted

## 2014-10-12 DIAGNOSIS — Z0000 Encounter for general adult medical examination without abnormal findings: Secondary | ICD-10-CM

## 2014-10-12 DIAGNOSIS — Z111 Encounter for screening for respiratory tuberculosis: Secondary | ICD-10-CM | POA: Diagnosis not present

## 2014-10-15 ENCOUNTER — Encounter: Payer: Self-pay | Admitting: Internal Medicine

## 2014-10-15 ENCOUNTER — Other Ambulatory Visit: Payer: Self-pay | Admitting: Internal Medicine

## 2014-10-15 ENCOUNTER — Ambulatory Visit (INDEPENDENT_AMBULATORY_CARE_PROVIDER_SITE_OTHER): Payer: Commercial Managed Care - HMO | Admitting: Internal Medicine

## 2014-10-15 VITALS — BP 141/71 | HR 76 | Temp 98.3°F | Wt 185.5 lb

## 2014-10-15 DIAGNOSIS — M1712 Unilateral primary osteoarthritis, left knee: Secondary | ICD-10-CM

## 2014-10-15 DIAGNOSIS — I10 Essential (primary) hypertension: Secondary | ICD-10-CM

## 2014-10-15 DIAGNOSIS — M170 Bilateral primary osteoarthritis of knee: Secondary | ICD-10-CM | POA: Insufficient documentation

## 2014-10-15 DIAGNOSIS — H8110 Benign paroxysmal vertigo, unspecified ear: Secondary | ICD-10-CM

## 2014-10-15 DIAGNOSIS — Z0000 Encounter for general adult medical examination without abnormal findings: Secondary | ICD-10-CM

## 2014-10-15 MED ORDER — HYDROCHLOROTHIAZIDE 12.5 MG PO TABS: 25.0000 mg | ORAL_TABLET | Freq: Every day | ORAL | Status: DC

## 2014-10-15 NOTE — Assessment & Plan Note (Signed)
Left knee weakness causing 2 falls since she was last seen in June. No serious injuries associated. Was seen at the ED on 7/20 with increased swelling of the wrist and knee at that time. Improved on dexamethasone shot, tramadol, and ketorolac injection. Has not had imaging of the knee done previously. Complains of weakness more than pain, most commonly when standing up. She also brought request for handicapped parking pass given increased difficulty walking long distance due to her left knee and concern for falls.  - Order placed for L knee xray, patient states she will try to attend within 1 week - Referral to PT for walking and fall safety evaluation, exercise recommendations - 6 month disabled parking form attested, follow up studies and functional status to consider long term handicapped status vs adequate improvement

## 2014-10-15 NOTE — Assessment & Plan Note (Signed)
DEXA previously ordered in 04/2014, has not been obtained. Fracture risk especially should be assessed with history of multiple falls.  - Patient recommended to follow up for DEXA and mammography, both orders standing

## 2014-10-15 NOTE — Assessment & Plan Note (Addendum)
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 Referred to physical therapy to assess left knee strength and exercise suggestions  -Check Na/K/Cr at follow up for tolerance of increased thiazide diuretic dose.

## 2014-10-15 NOTE — Progress Notes (Signed)
Subjective:   Patient ID: Victoria Campos female   DOB: 10/15/1950 65 y.o.   MRN: [id number]  HPI: [name] is a 64 y.o. 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  See problem based charting for specific details.  Past Medical History  Diagnosis Date  . Hypertension   . Hypercholesteremia   . Heart murmur   . Insomnia   . PE (pulmonary embolism)   . Pulmonary embolism 09/23/2011    Acute segmental bilateral segmental PE, Dx 09/23/11 with Pos lupus anticoagulant, neg venous dopplers, echo s R Ht strain    . ANXIETY 03/10/2004    Qualifier: Diagnosis of  By: Radene Ou MD, [place]     Current Outpatient Prescriptions  Medication Sig Dispense Refill  . amLODipine (NORVASC) 5 MG tablet Take 1 tablet (5 mg total) by mouth daily. 90 tablet 3  . aspirin 81 MG chewable tablet Chew 81 mg by mouth daily.    Marland Kitchen atorvastatin (LIPITOR) 80 MG tablet Take 1 tablet (80 mg total) by mouth daily. 30 tablet 6  . fluticasone (FLONASE) 50 MCG/ACT nasal spray Place 2 sprays into both nostrils daily. 16 g 2  . hydrochlorothiazide (HYDRODIURIL) 12.5 MG tablet Take 2 tablets (25 mg total) by mouth daily. 60 tablet 5  . ibuprofen (ADVIL,MOTRIN) 400 MG tablet Take 1 tablet (400 mg total) by mouth every 6 (six) hours as needed. 45 tablet 0  . metoprolol (LOPRESSOR) 50 MG tablet TAKE ONE TABLET BY MOUTH TWICE DAILY 120 tablet 0   No current facility-administered medications for this visit.   Family History  Problem Relation Age of Onset  . Cancer Brother     colon  . Cancer Sister     colon  . Cancer Sister     breast  . Heart attack Father   . Heart disease Father    History   Social History  . Marital Status: Single    Spouse Name: N/A  . Number of Children: N/A  . Years of Education: N/A   Social History Main Topics  . Smoking status:  Former Smoker    Types: Cigarettes    Quit date: 03/12/1968  . Smokeless tobacco: Never Used  . Alcohol Use: 0.6 oz/week    1 Glasses of wine per week     Comment: occasional  . Drug Use: No  . Sexual Activity: Not on file   Other Topics Concern  . None   Social History Narrative   Review of Systems: Review of Systems  Constitutional: Negative for fever, chills and weight loss.  Eyes: Negative for blurred vision and double vision.  Respiratory: Negative for sputum production.   Cardiovascular: Negative for chest pain and claudication.  Gastrointestinal: Negative for heartburn, nausea and vomiting.  Genitourinary: Negative for dysuria and frequency.  Musculoskeletal: Positive for back pain and joint pain.  Skin: Negative for rash.  Neurological: Positive for headaches. Negative for dizziness and tingling.    Objective:  Physical Exam: Filed Vitals:   10/15/14 1343  BP: 141/71  Pulse: 76  Temp: 98.3 F (36.8 C)  TempSrc: Oral  Weight: 185 lb 8 oz (84.142 kg)  SpO2: 98%   GENERAL- alert, co-operative, appears as stated age, NAD HEENT- Atraumatic, oral mucosa appears moist, no cervical LN enlargement CARDIAC- RRR, 2/6 systolic ejection murmur, no s3 or s4 RESP- CTAB, no wheezes or crackles. ABDOMEN-  Soft, nontender, no guarding or rebound, bowel sounds present. BACK- No paraspinal tenderness to palpation, straight leg raise negative NEURO- 5/5 strength in RLE, 4/5 strength in LLE on knee flexion and extention, 5/5 hip flexion/extension and foot plantar/dorsiflexion EXTREMITIES- pulse 2+, symmetric, no pedal edema. SKIN- Warm, dry, seborrheic keratosis on L shin. PSYCH- Normal mood and affect, appropriate thought content and speech.  Assessment & Plan:

## 2014-10-15 NOTE — Patient Instructions (Signed)
We increased your blood pressure medicine as follows: HCTZ (hydrochlorothiazide) take 2 pills daily for total 25mg  Continue walking and activity as much as you can for safety and time constraints  An xray has been ordered for your left knee with [facility] hospital. You can obtain this with radiology without appointment any normal work hours.  A referral is made to physical therapy who will call you with appointment information about a session to evaluate your walking and any recommendations for improving your safety from falls.  We will repeat your referral for DEXA (bone density) scan and mammogram. You should receive calls with these appointment information.  Please arrange follow up appointment in 6-8 weeks for blood pressure check and review of your xray results.

## 2014-10-25 NOTE — Progress Notes (Signed)
Internal Medicine Clinic Attending  I saw and evaluated the patient.  I personally confirmed the key portions of the history and exam documented by Dr. Rice and I reviewed pertinent patient test results.  The assessment, diagnosis, and plan were formulated together and I agree with the documentation in the resident's note.  

## 2014-10-26 ENCOUNTER — Encounter: Payer: Self-pay | Admitting: *Deleted

## 2014-10-29 ENCOUNTER — Other Ambulatory Visit: Payer: Self-pay | Admitting: Internal Medicine

## 2014-11-08 ENCOUNTER — Ambulatory Visit (HOSPITAL_COMMUNITY)
Admission: RE | Admit: 2014-11-08 | Discharge: 2014-11-08 | Disposition: A | Discharge status: Home / Self Care | Payer: Commercial Managed Care - HMO | Source: Ambulatory Visit | Attending: Internal Medicine | Admitting: Internal Medicine

## 2014-11-08 DIAGNOSIS — M179 Osteoarthritis of knee, unspecified: Secondary | ICD-10-CM | POA: Insufficient documentation

## 2014-11-08 DIAGNOSIS — M25562 Pain in left knee: Secondary | ICD-10-CM | POA: Insufficient documentation

## 2014-11-08 DIAGNOSIS — M1712 Unilateral primary osteoarthritis, left knee: Secondary | ICD-10-CM | POA: Diagnosis not present

## 2014-11-12 ENCOUNTER — Other Ambulatory Visit: Payer: Self-pay | Admitting: Internal Medicine

## 2014-11-12 NOTE — Telephone Encounter (Signed)
Message sent to PCP.

## 2014-11-12 NOTE — Telephone Encounter (Signed)
Pt called requesting atorvastatin to be filled.

## 2014-11-13 MED ORDER — ATORVASTATIN CALCIUM 80 MG PO TABS: 80.0000 mg | ORAL_TABLET | Freq: Every day | ORAL | Status: DC

## 2014-11-29 NOTE — Addendum Note (Signed)
Addended by: Hulan Fray on: 11/29/2014 04:42 PM   Modules accepted: Orders

## 2014-12-05 ENCOUNTER — Other Ambulatory Visit: Payer: Self-pay | Admitting: Internal Medicine

## 2014-12-07 ENCOUNTER — Other Ambulatory Visit: Payer: Self-pay | Admitting: Internal Medicine

## 2015-01-11 ENCOUNTER — Other Ambulatory Visit: Payer: Self-pay | Admitting: Internal Medicine

## 2015-01-21 ENCOUNTER — Encounter: Payer: Commercial Managed Care - HMO | Admitting: Internal Medicine

## 2015-01-25 ENCOUNTER — Encounter: Payer: Self-pay | Admitting: Student

## 2015-02-15 ENCOUNTER — Encounter: Payer: Commercial Managed Care - HMO | Admitting: Internal Medicine

## 2015-02-16 ENCOUNTER — Ambulatory Visit (INDEPENDENT_AMBULATORY_CARE_PROVIDER_SITE_OTHER): Payer: Commercial Managed Care - HMO | Admitting: Internal Medicine

## 2015-02-16 ENCOUNTER — Encounter: Payer: Self-pay | Admitting: Internal Medicine

## 2015-02-16 VITALS — BP 120/70 | HR 76 | Temp 98.3°F | Ht 64.5 in | Wt 183.2 lb

## 2015-02-16 DIAGNOSIS — Z23 Encounter for immunization: Secondary | ICD-10-CM | POA: Diagnosis not present

## 2015-02-16 DIAGNOSIS — H6123 Impacted cerumen, bilateral: Secondary | ICD-10-CM | POA: Diagnosis not present

## 2015-02-16 DIAGNOSIS — I10 Essential (primary) hypertension: Secondary | ICD-10-CM | POA: Diagnosis not present

## 2015-02-16 DIAGNOSIS — H6120 Impacted cerumen, unspecified ear: Secondary | ICD-10-CM | POA: Insufficient documentation

## 2015-02-16 DIAGNOSIS — M1712 Unilateral primary osteoarthritis, left knee: Secondary | ICD-10-CM | POA: Diagnosis not present

## 2015-02-16 DIAGNOSIS — Z0000 Encounter for general adult medical examination without abnormal findings: Secondary | ICD-10-CM

## 2015-02-16 DIAGNOSIS — Z780 Asymptomatic menopausal state: Secondary | ICD-10-CM

## 2015-02-16 NOTE — Assessment & Plan Note (Signed)
BP Readings from Last 3 Encounters:  02/16/15 120/70  10/15/14 141/71  09/29/14 150/69    Lab Results  Component Value Date   NA 138 01/27/2014   K 3.8 01/27/2014   CREATININE 0.73 01/27/2014    Assessment: Blood pressure control: Well controlled Progress toward BP goal:  Good progress Comments: Patient now on amlodipine 5 mg, metoprolol 50 mg twice a day, HCTZ 25 mg, and is physically active with her granddaughter and works at daycare on a daily basis  Plan: Medications:  continue current medications Self management tools provided: Patient has a home blood pressure monitor and states when she checks at home and is most commonly around [id number] systolic, but does not document this Other plans: Patient is doing very well now on current plan. Will need routine lab monitoring at her next appointment since she is one year out from previous Bment and on HCTZ 25 mg.

## 2015-02-16 NOTE — Assessment & Plan Note (Signed)
Bilateral impacted cerumen treated with irrigation at clinic visit today. This is a recurrent problem for Ms. Victoria Campos and I recommended her to resume trying to use Debrox drops or other equivalent to minimize the amount of mechanical irritation from repeated clearing of impacted cerumen. Also counseled her again on avoiding Q-tips or any other object in attempting to mechanically clean the ear herself. She firmly denies that she is using any kind of tool in her ear canals.

## 2015-02-16 NOTE — Assessment & Plan Note (Signed)
Assessment: Left knee weakness improved since last time she was seen, and she has not had more falls as a result of this. She is currently able to do her job and daily activities without excessive amount of pain. She is currently taking no medication for this. She is very disinterested in any injections or surgical evaluation for her degenerative joint disease at this time. She still has not followed up for bone densitometry or physical therapy.  Plan: Referral to physical therapy for walking and fall safety evaluation, DME recommendations

## 2015-02-16 NOTE — Addendum Note (Signed)
Addended by: Lalla Brothers T on: 02/16/2015 03:24 PM   Modules accepted: Level of Service

## 2015-02-16 NOTE — Patient Instructions (Signed)
Your blood pressure was very good today, 120/70.  Today we referred you for physical therapy for a session to instruct you on the best way to strengthen your leg for your left knee pain, and recommend if any assistive devices is appropriate.  We also strongly recommend following up for a bone density scan to check if you have osteoporosis, since this is treatable and could greatly modify your risk of a bone fracture in the future.

## 2015-02-16 NOTE — Progress Notes (Signed)
Subjective:   Patient ID: [name] female   DOB: 07-06-1950 64 y.o.   MRN: [id number]  HPI: [name] is a 64 y.o. problem with past medical history as described below presenting for follow-up of her hypertension and left knee osteoarthritis. Her left knee pain has been fairly mild and does not prevent her from performing normal daily activities and working in a daycare setting. However she does feel that her knee catches requiring her to stop her catch herself about twice per week. She has not fallen as a result this catching. She does get exercise on a daily basis at least walking several blocks. She worked with physical therapy in the past but not within the last 5 years.  See problem based assessment and plan below for additional details.   Past Medical History  Diagnosis Date  . Hypertension   . Hypercholesteremia   . Heart murmur   . Insomnia   . PE (pulmonary embolism)   . Pulmonary embolism ([place]) 09/23/2011    Acute segmental bilateral segmental PE, Dx 09/23/11 with Pos lupus anticoagulant, neg venous dopplers, echo s R Ht strain    . ANXIETY 03/10/2004    Qualifier: Diagnosis of  By: Victoria Ou MD, Victoria Campos     Current Outpatient Prescriptions  Medication Sig Dispense Refill  . amLODipine (NORVASC) 5 MG tablet TAKE ONE TABLET BY MOUTH ONCE DAILY 240 tablet 0  . aspirin 81 MG chewable tablet Chew 81 mg by mouth daily.    Marland Kitchen atorvastatin (LIPITOR) 80 MG tablet Take 1 tablet (80 mg total) by mouth daily. 30 tablet 6  . fluticasone (FLONASE) 50 MCG/ACT nasal spray Place 2 sprays into both nostrils daily. 16 g 2  . hydrochlorothiazide (HYDRODIURIL) 12.5 MG tablet Take 2 tablets (25 mg total) by mouth daily. 60 tablet 5  . ibuprofen (ADVIL,MOTRIN) 400 MG tablet Take 1 tablet (400 mg total) by mouth every 6 (six) hours as needed. 45 tablet 0  . metoprolol (LOPRESSOR) 50 MG tablet TAKE ONE TABLET BY MOUTH TWICE DAILY 120 tablet 0  . metoprolol (LOPRESSOR) 50 MG tablet TAKE  ONE TABLET BY MOUTH TWICE DAILY 120 tablet 0  . naproxen (NAPROSYN) 125 MG/5ML suspension Take by mouth.     No current facility-administered medications for this visit.   Family History  Problem Relation Age of Onset  . Cancer Brother     colon  . Cancer Sister     colon  . Cancer Sister     breast  . Heart attack Father   . Heart disease Father    Social History   Social History  . Marital Status: Single    Spouse Name: N/A  . Number of Children: N/A  . Years of Education: N/A   Social History Main Topics  . Smoking status: Former Smoker    Types: Cigarettes    Quit date: 03/12/1968  . Smokeless tobacco: Never Used  . Alcohol Use: 0.6 oz/week    1 Glasses of wine per week     Comment: occasional  . Drug Use: No  . Sexual Activity: Not Asked   Other Topics Concern  . None   Social History Narrative   Review of Systems: Review of Systems  Constitutional: Negative for fever and chills.  HENT: Negative for ear pain.        Impacted cerumen with hearing loss  Respiratory: Negative for shortness of breath.   Cardiovascular: Negative for chest pain and leg swelling.  Musculoskeletal:  Positive for joint pain. Negative for falls.  Neurological: Negative for dizziness, focal weakness and headaches.    Objective:  Physical Exam: Filed Vitals:   02/16/15 0916  BP: 144/76  Pulse: 64  Temp: 98.3 F (36.8 C)  TempSrc: Oral  Height: 5' 4.5" (1.638 m)  Weight: 183 lb 3.2 oz (83.099 kg)  SpO2: 98%   GENERAL- alert, co-operative, NAD HEENT- Atraumatic, oral mucosa appears moist, good and intact dentition. Impacted cerumen in ear canals bilaterally preventing visualization of the tympanic membranes, left greater than right, without significant erythema or discharge CARDIAC- RRR, no murmurs, rubs or gallops. RESP- CTAB, no wheezes or crackles. EXTREMITIES- symmetric, no pedal edema, full passive and active ROM of the left knee, 5 out of 5 strength, crepitus on ROM,  minimally tender to palpation SKIN- Warm, dry, No rash or lesion. PSYCH- Normal mood and affect, appropriate thought content and speech.  Assessment & Plan:

## 2015-02-16 NOTE — Progress Notes (Signed)
Internal Medicine Clinic Attending  I saw and evaluated the patient.  I personally confirmed the key portions of the history and exam documented by Dr. Rice and I reviewed pertinent patient test results.  The assessment, diagnosis, and plan were formulated together and I agree with the documentation in the resident's note.  

## 2015-02-16 NOTE — Assessment & Plan Note (Signed)
Influenza vaccine given today. Patient still needs bone densitometry which has been recommended and referral to placed as early as February of this year. Although she has not had more falls since her last visit she started increased fall risk so is particularly important to address her bone density.  New referral placed to [facility] center for bone densitometry

## 2015-03-02 ENCOUNTER — Ambulatory Visit: Payer: Commercial Managed Care - HMO | Attending: Internal Medicine | Admitting: Physical Therapy

## 2015-03-02 DIAGNOSIS — M25662 Stiffness of left knee, not elsewhere classified: Secondary | ICD-10-CM | POA: Insufficient documentation

## 2015-03-02 DIAGNOSIS — R262 Difficulty in walking, not elsewhere classified: Secondary | ICD-10-CM | POA: Insufficient documentation

## 2015-03-02 DIAGNOSIS — M1712 Unilateral primary osteoarthritis, left knee: Secondary | ICD-10-CM | POA: Insufficient documentation

## 2015-03-02 DIAGNOSIS — M6281 Muscle weakness (generalized): Secondary | ICD-10-CM | POA: Insufficient documentation

## 2015-03-02 NOTE — Addendum Note (Signed)
Addended by: Alinda Dooms A on: 03/02/2015 02:13 PM   Modules accepted: Orders

## 2015-03-09 ENCOUNTER — Ambulatory Visit: Payer: Commercial Managed Care - HMO | Admitting: Physical Therapy

## 2015-03-09 ENCOUNTER — Encounter: Payer: Self-pay | Admitting: Physical Therapy

## 2015-03-09 DIAGNOSIS — M1712 Unilateral primary osteoarthritis, left knee: Secondary | ICD-10-CM

## 2015-03-09 DIAGNOSIS — R262 Difficulty in walking, not elsewhere classified: Secondary | ICD-10-CM

## 2015-03-09 DIAGNOSIS — M25662 Stiffness of left knee, not elsewhere classified: Secondary | ICD-10-CM

## 2015-03-09 DIAGNOSIS — M6281 Muscle weakness (generalized): Secondary | ICD-10-CM | POA: Diagnosis not present

## 2015-03-09 NOTE — Therapy (Addendum)
[facility] [address] [place], [place], [address] Phone: [phone]   Fax:  [phone]  Physical Therapy Evaluation  Patient Details  Name: Victoria Campos MRN: [id number] Date of Birth: May 12, 1950 Referring Provider: Dr. Sheliah Hatch  Encounter Date: 03/09/2015      PT End of Session - 03/09/15 0939    Visit Number 1   Number of Visits 16   Date for PT Re-Evaluation 05/04/15   Authorization Type [employer]  Needs G codes, visit 10 prog note   PT Start Time 0900   PT Stop Time 0945   PT Time Calculation (min) 45 min   Activity Tolerance Patient tolerated treatment well      Past Medical History  Diagnosis Date  . Hypertension   . Hypercholesteremia   . Heart murmur   . Insomnia   . PE (pulmonary embolism)   . Pulmonary embolism ([facility]) 09/23/2011    Acute segmental bilateral segmental PE, Dx 09/23/11 with Pos lupus anticoagulant, neg venous dopplers, echo s R Ht strain    . ANXIETY 03/10/2004    Qualifier: Diagnosis of  By: Barbaraann Barthel MD, [place]      Past Surgical History  Procedure Laterality Date  . [place]      removed  . Tubal ligation    . Arm surgery      torn ligamint    There were no vitals filed for this visit.  Visit Diagnosis:  Primary osteoarthritis of left knee - Plan: PT plan of care cert/re-cert  Knee stiff, left - Plan: PT plan of care cert/re-cert  Muscle weakness of lower extremity - Plan: PT plan of care cert/re-cert  Difficulty in walking - Plan: PT plan of care cert/re-cert      Subjective Assessment - 03/09/15 0903    Subjective Patient arrives 15 min late.  Reports knee pain "all my life" but has worsened in the last 1 1/2 years.  Pain anterior and deep in the knee. Worse up hill walk, stand, up the steps.  Some give-way with walking 1-2x/month.   Minimal pain in right knee as well.    No injections.  Taking Tylenol .     Limitations Walking;Standing   How long can you  walk comfortably? 1 hour   Diagnostic tests x-ray showed arthritis "moderate to advanced"    Patient Stated Goals avoid surgery   Currently in Pain? No/denies   Pain Orientation Left   Pain Type Chronic pain   Pain Onset More than a month ago   Pain Frequency Intermittent   Aggravating Factors  walk uphill, up stairs, swells end of the day, prolonged; getting off the floor, can't kneel on left, sometimes dancing   Pain Relieving Factors sit, rest, ice            [facility] PT Assessment - 03/09/15 0910    Assessment   Medical Diagnosis osteoarthritis left knee   Referring Provider Dr. Cristal Deer Rice/Dr. Tyson Alias   Onset Date/Surgical Date --  6 months   Hand Dominance Right   Next MD Visit 3 weeks   Prior Therapy no   Precautions   Precautions None   Restrictions   Weight Bearing Restrictions No   Balance Screen   Has the patient fallen in the past 6 months No   Has the patient had a decrease in activity level because of a fear of falling?  No   Is the patient reluctant to leave their home because of  a fear of falling?  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   Leisure dance, play tennis, shopping, travel   Observation/Other Assessments   Focus on Therapeutic Outcomes (FOTO)  55% limited   AROM   Right/Left Knee Right;Left   Right Knee Extension 0   Right Knee Flexion 138   Left Knee Extension 0   Left Knee Flexion 130   Strength   Strength Assessment Site Hip;Knee   Right/Left Hip Right;Left   Right Hip ABduction 4-/5   Left Hip ABduction 3/5   Right/Left Knee Right;Left   Right Knee Flexion 4+/5   Right Knee Extension 4+/5   Left Knee Flexion 4-/5   Left Knee Extension  4-/5   Flexibility   Soft Tissue Assessment /Muscle Length yes   Quadriceps decreased   ITB decreased   Palpation   Patella mobility WNLs   Special Tests   Hip Special Tests  Marcello Moores Test;Ober's Test;Hip Scouring   Thomas Test    Findings Positive   Side Left   Ober's Test   Findings Positive   Side Left   Hip Scouring   Findings Negative   Side Left   Comments although pain with hip ER                           PT Education - 03/09/15 0938    Education provided Yes   Education Details quad sets and knee level 1 ex;  recommend arch supports and use of a single point cane for long distance ambulation; discussed arthritis aquatic programs in community   Person(s) Educated Patient   Methods Explanation;Demonstration;Handout   Comprehension Verbalized understanding          PT Short Term Goals - 03/09/15 1006    PT SHORT TERM GOAL #1   Title The patient will have knowledge of basic self care principles including heat, ice, use of cane for long distances, ROM  04/06/15   Time 4   Period Weeks   Status New   PT SHORT TERM GOAL #2   Title The patient will have improved knee flexion ROM to 134 degrees needed for partial squatting, getting up from lower surfaces 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   Time 4   Period Weeks   Status New   PT SHORT TERM GOAL #3   Title The patient will report a 25% reduction in pain   Time 4   Period Weeks   Status New           PT Long Term Goals - 03/09/15 1009    PT LONG TERM GOAL #1   Title The patient will be independent in safe self progression of HEP, gym and/or aquatic program for further gains in strength   05/04/15   Time 8   Period Weeks   Status New   PT LONG TERM GOAL #2   Title The patient will have improved left knee flexion to 138 degrees needed for improved moblity 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   Time 8   Period Weeks   Status New   PT LONG TERM GOAL #  3   Title The patient will have improved hip  abduction strength to 4-/5 needed for standing longer periods of time and walking longer distances   Time 8   Period Weeks   Status New   PT LONG TERM GOAL #4   Title The patient will have quad and HS strength to at least 4/5 needed for walking 2 blocks without knee give-way   Time 8   Period Weeks   Status New   PT LONG TERM GOAL #5   Title FOTO functional outcome score improved from 55% limitation to 43% indicating improved function with less pain   Time 8   Period Weeks   Status New               Plan - 03/20/15 0957    Clinical Impression Statement The patient reports a long history of left knee pain which has worsened over the last 1 1/2 years.  Pain is worse with walking uphill, ascending steps, prolonged standing,squatting, kneeling on left knee and getting up off the floor.  She reports knee give-way about 2x/month.  She has decreased left knee flexion to 130 degrees (right 138 degrees).  Decreased hip flexor and ITB muscle lengths.  Decreased left quad, HS 4-5 and gluteal muscle strength 3/5.  Crepitus palpated with knee flexion and extension.  Mild swelling present this AM;  Bilateral pes planus.  She would benefit from PT to address these deficits and establish a HEP.     Pt will benefit from skilled therapeutic intervention in order to improve on the following deficits Decreased range of motion;Decreased strength;Difficulty walking;Impaired flexibility;Pain   Rehab Potential Good   PT Frequency 2x / week   PT Duration 8 weeks   PT Treatment/Interventions ADLs/Self Care Home Management;Cryotherapy;Electrical Stimulation;Moist Heat;Therapeutic exercise;Patient/family education;Neuromuscular re-education;Manual techniques;Dry needling;Taping;Vasopneumatic Device   PT Next Visit Plan start Nu-Step and/or bike; review HEP;  add hip flexor stretch and ITB;  start clams; cryotherapy as needed   PT Home Exercise Plan knee level 1 ex's, quad sets, recommend arch supports, discussed  possible use of a cane for long distance walking; discussed local arthritis aquatic programs in community          G-Codes - 2015-03-20 0955    Functional Assessment Tool Used FOTO; clinical judgement   Functional Limitation Mobility: Walking and moving around   Mobility: Walking and Moving Around Current Status (Q9476) At least 40 percent but less than 60 percent impaired, limited or restricted   Mobility: Walking and Moving Around Goal Status [id number]) At least 20 percent but less than 40 percent impaired, limited or restricted       Problem List Patient Active Problem List   Diagnosis Date Noted  . Cerumen impaction 02/16/2015  . Essential hypertension 10/15/2014  . Osteoarthritis of left knee 10/15/2014  . Osteoarthritis of back 01/27/2014  . Healthcare maintenance 09/17/2013  . Myopia of left eye 03/20/2013  . HEARING LOSS, SENSORINEURAL, BILATERAL 12/27/2009  . TINNITUS 03/14/2009  . BENIGN PAROXYSMAL POSITIONAL VERTIGO 10/21/2007  . ABNORMAL RESULT, FUNCTION STUDY, EKG 05/06/2006  . ALLERGIC RHINITIS, SEASONAL 08/24/2004  . Hyperlipidemia 04/21/2004  . DEPRESSION 03/10/2004    Alvera Singh 20-Mar-2015, 10:18 AM  [facility] [address] [place], [place], [address] Phone: [phone]   Fax:  [phone]  Name: Victoria Campos MRN: [id number] Date of Birth: 03-29-1950   Ruben Im, PT 03/20/15 10:19 AM Phone: [phone] Fax: [phone]   PHYSICAL THERAPY DISCHARGE SUMMARY  Visits from Start of Care: 1  Current functional level related to goals / functional outcomes: See above   Remaining deficits: See above   Education / Equipment: See above   Plan: Patient agrees to discharge.  Patient goals were not met. Patient is being discharged due to not returning since the last visit.  ?????       Pt did not return after initial evaluation.  Romualdo Bolk, PT, DPT 05/13/2015 12:48  PM Phone: [phone] Fax: [phone]

## 2015-03-09 NOTE — Patient Instructions (Signed)
Quad sets seated or lying down. Tighten these muscles, hold 5 sec.  Do 10x.  Do several times per day.  Copyright  VHI. All rights reserved.  HIP: Flexion / KNEE: Extension, Straight Leg Raise   Raise leg, keeping knee straight. Perform slowly. 10___ reps per set, _1-2__ sets per day, _7__ days per week   Copyright  VHI. All rights reserved.  Heel Slide   Bend knee and pull heel toward buttocks. Hold ___10_ seconds. Return. Repeat with other knee. Repeat __10__ times. Do __1-2__ sessions per day.  http://gt2.exer.us/372   Copyright  VHI. All rights reserved.     Raise leg until knee is straight. _10__ reps per set, _1__ sets per day, 7___ days per week  Copyright  VHI. All rights reserved.

## 2015-03-18 ENCOUNTER — Ambulatory Visit: Payer: Commercial Managed Care - HMO | Attending: Internal Medicine

## 2015-03-23 ENCOUNTER — Ambulatory Visit: Payer: Commercial Managed Care - HMO | Admitting: Physical Therapy

## 2015-03-25 ENCOUNTER — Ambulatory Visit: Payer: Commercial Managed Care - HMO | Admitting: Physical Therapy

## 2015-03-30 ENCOUNTER — Encounter: Payer: Commercial Managed Care - HMO | Admitting: Physical Therapy

## 2015-04-01 ENCOUNTER — Ambulatory Visit: Payer: Commercial Managed Care - HMO | Admitting: Physical Therapy

## 2015-04-08 ENCOUNTER — Ambulatory Visit: Payer: Commercial Managed Care - HMO | Admitting: Physical Therapy

## 2015-04-08 ENCOUNTER — Telehealth: Payer: Self-pay | Admitting: Physical Therapy

## 2015-04-08 NOTE — Telephone Encounter (Signed)
Home voicemail full. Left message on mobile voicemail regarding missed appointments. Asked patient to call and reschedule if she would like to continue.

## 2015-04-12 ENCOUNTER — Telehealth: Payer: Self-pay | Admitting: Internal Medicine

## 2015-04-12 NOTE — Telephone Encounter (Signed)
Patient called in reference to her needing her Temp  DMV Placard  Being renewed for another 6 months.  Patient also stated she has not been going the PT because she could not afford to go 3 times a week.  Patient has talked to PT and explained her no shows and is now sch on 04/22/2015.  Please advise.

## 2015-04-19 ENCOUNTER — Emergency Department (HOSPITAL_COMMUNITY)
Admission: EM | Admit: 2015-04-19 | Discharge: 2015-04-19 | Disposition: A | Discharge status: Home / Self Care | Payer: Commercial Managed Care - HMO | Attending: Emergency Medicine | Admitting: Emergency Medicine

## 2015-04-19 ENCOUNTER — Encounter (HOSPITAL_COMMUNITY): Payer: Self-pay | Admitting: *Deleted

## 2015-04-19 DIAGNOSIS — Z86711 Personal history of pulmonary embolism: Secondary | ICD-10-CM | POA: Insufficient documentation

## 2015-04-19 DIAGNOSIS — Z7951 Long term (current) use of inhaled steroids: Secondary | ICD-10-CM | POA: Diagnosis not present

## 2015-04-19 DIAGNOSIS — Z7982 Long term (current) use of aspirin: Secondary | ICD-10-CM | POA: Diagnosis not present

## 2015-04-19 DIAGNOSIS — Z8659 Personal history of other mental and behavioral disorders: Secondary | ICD-10-CM | POA: Diagnosis not present

## 2015-04-19 DIAGNOSIS — M1990 Unspecified osteoarthritis, unspecified site: Secondary | ICD-10-CM | POA: Diagnosis not present

## 2015-04-19 DIAGNOSIS — M545 Low back pain, unspecified: Secondary | ICD-10-CM

## 2015-04-19 DIAGNOSIS — E7800 Pure hypercholesterolemia, unspecified: Secondary | ICD-10-CM | POA: Insufficient documentation

## 2015-04-19 DIAGNOSIS — Z87891 Personal history of nicotine dependence: Secondary | ICD-10-CM | POA: Diagnosis not present

## 2015-04-19 DIAGNOSIS — M47819 Spondylosis without myelopathy or radiculopathy, site unspecified: Secondary | ICD-10-CM

## 2015-04-19 DIAGNOSIS — Z79899 Other long term (current) drug therapy: Secondary | ICD-10-CM | POA: Diagnosis not present

## 2015-04-19 DIAGNOSIS — M47895 Other spondylosis, thoracolumbar region: Secondary | ICD-10-CM | POA: Insufficient documentation

## 2015-04-19 DIAGNOSIS — R011 Cardiac murmur, unspecified: Secondary | ICD-10-CM | POA: Insufficient documentation

## 2015-04-19 DIAGNOSIS — I10 Essential (primary) hypertension: Secondary | ICD-10-CM | POA: Diagnosis not present

## 2015-04-19 DIAGNOSIS — Z8669 Personal history of other diseases of the nervous system and sense organs: Secondary | ICD-10-CM | POA: Diagnosis not present

## 2015-04-19 MED ORDER — CYCLOBENZAPRINE HCL 10 MG PO TABS: 10.0000 mg | ORAL_TABLET | Freq: Three times a day (TID) | ORAL | Status: DC | PRN

## 2015-04-19 MED ORDER — CYCLOBENZAPRINE HCL 10 MG PO TABS
5.0000 mg | ORAL_TABLET | Freq: Once | ORAL | Status: AC
  Administered 2015-04-19: 5 mg via ORAL
  Filled 2015-04-19: qty 1

## 2015-04-19 MED ORDER — HYDROCODONE-ACETAMINOPHEN 5-325 MG PO TABS: 1.0000 | ORAL_TABLET | Freq: Four times a day (QID) | ORAL | Status: DC | PRN

## 2015-04-19 MED ORDER — IBUPROFEN 600 MG PO TABS: 600.0000 mg | ORAL_TABLET | Freq: Three times a day (TID) | ORAL | Status: DC | PRN

## 2015-04-19 MED ORDER — IBUPROFEN 200 MG PO TABS
600.0000 mg | ORAL_TABLET | Freq: Once | ORAL | Status: AC
  Administered 2015-04-19: 600 mg via ORAL
  Filled 2015-04-19: qty 3

## 2015-04-19 MED ORDER — HYDROCODONE-ACETAMINOPHEN 5-325 MG PO TABS
1.0000 | ORAL_TABLET | Freq: Once | ORAL | Status: AC
  Administered 2015-04-19: 1 via ORAL
  Filled 2015-04-19: qty 1

## 2015-04-19 NOTE — ED Provider Notes (Signed)
CSN: [id number]     Arrival date & time 04/19/15  0905 History   First MD Initiated Contact with Patient 04/19/15 0914     Chief Complaint  Patient presents with  . Sciatica      HPI Patient presents to the emergency department complaining of low back pain over the past several days.  She is had intermittent issues with her back before in the past.  She's been told she has arthritis in her low back.  She states no significant radiation of her pain except for mild pain down into her right buttock.  Denies weakness of her legs.  No abdominal pain.  No dysuria.  No fevers or chills.  No history of cancer.  She's tried anti-inflammatories without improvement in her symptoms.  No chest pain or shortness of breath   Past Medical History  Diagnosis Date  . Hypertension   . Hypercholesteremia   . Heart murmur   . Insomnia   . PE (pulmonary embolism)   . Pulmonary embolism ([place]) 09/23/2011    Acute segmental bilateral segmental PE, Dx 09/23/11 with Pos lupus anticoagulant, neg venous dopplers, echo s R Ht strain    . ANXIETY 03/10/2004    Qualifier: Diagnosis of  By: Radene Ou MD, [place]     Past Surgical History  Procedure Laterality Date  . [place]      removed  . Tubal ligation    . Arm surgery      torn ligamint   Family History  Problem Relation Age of Onset  . Cancer Brother     colon  . Cancer Sister     colon  . Cancer Sister     breast  . Heart attack Father   . Heart disease Father    Social History  Substance Use Topics  . Smoking status: Former Smoker    Types: Cigarettes    Quit date: 03/12/1968  . Smokeless tobacco: Never Used  . Alcohol Use: 0.6 oz/week    1 Glasses of wine per week     Comment: occasional   OB History    No data available     Review of Systems  All other systems reviewed and are negative.     Allergies  Sulfamethoxazole-trimethoprim  Home Medications   Prior to Admission medications   Medication Sig Start Date End Date Taking?  Authorizing Provider  amLODipine (NORVASC) 5 MG tablet TAKE ONE TABLET BY MOUTH ONCE DAILY 01/13/15   Collier Salina, MD  aspirin 81 MG chewable tablet Chew 81 mg by mouth daily.    Historical Provider, MD  atorvastatin (LIPITOR) 80 MG tablet Take 1 tablet (80 mg total) by mouth daily. 11/13/14   Collier Salina, MD  fluticasone (FLONASE) 50 MCG/ACT nasal spray Place 2 sprays into both nostrils daily. 05/10/14   Luan Moore, MD  hydrochlorothiazide (HYDRODIURIL) 12.5 MG tablet Take 2 tablets (25 mg total) by mouth daily. 10/15/14   Collier Salina, MD  ibuprofen (ADVIL,MOTRIN) 400 MG tablet Take 1 tablet (400 mg total) by mouth every 6 (six) hours as needed. 08/02/14   Luan Moore, MD  metoprolol (LOPRESSOR) 50 MG tablet TAKE ONE TABLET BY MOUTH TWICE DAILY 12/06/14   Collier Salina, MD   BP 119/81 mmHg  Pulse 67  Temp(Src) 97.9 F (36.6 C)  Resp 18  SpO2 100% Physical Exam  Constitutional: She is oriented to person, place, and time. She appears well-developed and well-nourished. No distress.  HENT:  Head: Normocephalic  and atraumatic.  Eyes: EOM are normal.  Neck: Normal range of motion.  Cardiovascular: Normal rate, regular rhythm and normal heart sounds.   Pulmonary/Chest: Effort normal and breath sounds normal.  Abdominal: Soft. She exhibits no distension. There is no tenderness.  Musculoskeletal: Normal range of motion.  Sick or lumbar tenderness.  Paralumbar tenderness on the right with mild spasm.  Normal strength of bilateral lower extremity major muscle groups  Neurological: She is alert and oriented to person, place, and time.  Skin: Skin is warm and dry.  Psychiatric: She has a normal mood and affect. Judgment normal.  Nursing note and vitals reviewed.   ED Course  Procedures (including critical care time) Labs Review Labs Reviewed - No data to display  Imaging Review No results found. I have personally reviewed and evaluated these images and lab results as  part of my medical decision-making.   EKG Interpretation None      MDM   Final diagnoses:  None    Normal lower extremity neurologic exam. No bowel or bladder complaints. Likely musculoskeletal back pain. Doubt spinal epidural abscess. Doubt cauda equina. Doubt abdominal aortic aneurysm   Dc home with pcp follow up    Jola Schmidt, MD 04/19/15 1007

## 2015-04-19 NOTE — ED Notes (Signed)
Pt here for evaluation for increased lower back pain.  Pt has chronic back pain issues but pain increased since the weekend.  Pain in lower back and sharp pain radiating down her right leg.

## 2015-04-19 NOTE — Discharge Instructions (Signed)

## 2015-04-22 ENCOUNTER — Ambulatory Visit: Payer: Commercial Managed Care - HMO | Attending: Internal Medicine

## 2015-05-09 NOTE — Addendum Note (Signed)
Addended by: Collier Salina on: 05/09/2015 11:05 AM   Modules accepted: Orders

## 2015-05-16 ENCOUNTER — Other Ambulatory Visit: Payer: Self-pay | Admitting: Internal Medicine

## 2015-05-16 NOTE — Telephone Encounter (Signed)
Pt requesting ibuprofen to be filled @ [facility] on [address].

## 2015-05-17 MED ORDER — IBUPROFEN 600 MG PO TABS
600.0000 mg | ORAL_TABLET | Freq: Three times a day (TID) | ORAL | Status: DC | PRN
Start: 2015-05-17 — End: 2015-06-21

## 2015-05-18 NOTE — Telephone Encounter (Signed)
Called to pharm 

## 2015-06-20 ENCOUNTER — Telehealth: Payer: Self-pay | Admitting: Internal Medicine

## 2015-06-20 NOTE — Telephone Encounter (Signed)
APPT. REMINDER CALL, LMTCB °

## 2015-06-21 ENCOUNTER — Ambulatory Visit (INDEPENDENT_AMBULATORY_CARE_PROVIDER_SITE_OTHER): Payer: Commercial Managed Care - HMO | Admitting: Internal Medicine

## 2015-06-21 ENCOUNTER — Other Ambulatory Visit: Payer: Self-pay | Admitting: Internal Medicine

## 2015-06-21 ENCOUNTER — Encounter: Payer: Self-pay | Admitting: Internal Medicine

## 2015-06-21 VITALS — BP 126/72 | HR 60 | Temp 98.0°F | Wt 181.4 lb

## 2015-06-21 DIAGNOSIS — I10 Essential (primary) hypertension: Secondary | ICD-10-CM | POA: Diagnosis not present

## 2015-06-21 DIAGNOSIS — H6123 Impacted cerumen, bilateral: Secondary | ICD-10-CM | POA: Diagnosis not present

## 2015-06-21 DIAGNOSIS — Z23 Encounter for immunization: Secondary | ICD-10-CM

## 2015-06-21 DIAGNOSIS — M47819 Spondylosis without myelopathy or radiculopathy, site unspecified: Secondary | ICD-10-CM | POA: Diagnosis not present

## 2015-06-21 DIAGNOSIS — M1712 Unilateral primary osteoarthritis, left knee: Secondary | ICD-10-CM

## 2015-06-21 DIAGNOSIS — Z1231 Encounter for screening mammogram for malignant neoplasm of breast: Secondary | ICD-10-CM

## 2015-06-21 DIAGNOSIS — E2839 Other primary ovarian failure: Secondary | ICD-10-CM

## 2015-06-21 MED ORDER — HYDROCHLOROTHIAZIDE 25 MG PO TABS: 25.0000 mg | ORAL_TABLET | Freq: Every day | ORAL | Status: DC

## 2015-06-21 MED ORDER — IBUPROFEN 600 MG PO TABS: 600.0000 mg | ORAL_TABLET | Freq: Three times a day (TID) | ORAL | Status: DC | PRN

## 2015-06-21 MED ORDER — CYCLOBENZAPRINE HCL 10 MG PO TABS: 10.0000 mg | ORAL_TABLET | Freq: Every evening | ORAL | Status: DC | PRN

## 2015-06-21 MED ORDER — AMLODIPINE BESYLATE 5 MG PO TABS: 5.0000 mg | ORAL_TABLET | Freq: Every day | ORAL | Status: DC

## 2015-06-21 MED ORDER — ATORVASTATIN CALCIUM 80 MG PO TABS: 80.0000 mg | ORAL_TABLET | Freq: Every day | ORAL | Status: DC

## 2015-06-21 MED ORDER — METOPROLOL SUCCINATE ER 50 MG PO TB24: 50.0000 mg | ORAL_TABLET | Freq: Every day | ORAL | Status: DC

## 2015-06-21 NOTE — Progress Notes (Signed)
Subjective:   Patient ID: Victoria Campos female   DOB: May 23, 1950 65 y.o.   MRN: [id number]  HPI: [name] is a 65 y.o. woman with PMHx detailed below presenting for follow up of her hypertension, osteorthritis, bone density monitoring, and bilateral cerumen impaction.   See problem based assessment and plan below for additional details.  Past Medical History  Diagnosis Date  . Hypertension   . Hypercholesteremia   . Heart murmur   . Insomnia   . PE (pulmonary embolism)   . Pulmonary embolism ([place]) 09/23/2011    Acute segmental bilateral segmental PE, Dx 09/23/11 with Pos lupus anticoagulant, neg venous dopplers, echo s R Ht strain    . ANXIETY 03/10/2004    Qualifier: Diagnosis of  By: Radene Ou MD, [place]     Current Outpatient Prescriptions  Medication Sig Dispense Refill  . amLODipine (NORVASC) 5 MG tablet TAKE ONE TABLET BY MOUTH ONCE DAILY 240 tablet 0  . aspirin 81 MG chewable tablet Chew 81 mg by mouth daily.    Marland Kitchen atorvastatin (LIPITOR) 80 MG tablet Take 1 tablet (80 mg total) by mouth daily. 30 tablet 6  . cyclobenzaprine (FLEXERIL) 10 MG tablet Take 1 tablet (10 mg total) by mouth 3 (three) times daily as needed for muscle spasms. 12 tablet 0  . fluticasone (FLONASE) 50 MCG/ACT nasal spray Place 2 sprays into both nostrils daily. 16 g 2  . hydrochlorothiazide (HYDRODIURIL) 12.5 MG tablet Take 2 tablets (25 mg total) by mouth daily. 60 tablet 5  . HYDROcodone-acetaminophen (NORCO/VICODIN) 5-325 MG tablet Take 1 tablet by mouth every 6 (six) hours as needed for moderate pain. 6 tablet 0  . ibuprofen (ADVIL,MOTRIN) 600 MG tablet Take 1 tablet (600 mg total) by mouth every 8 (eight) hours as needed. 15 tablet 0  . metoprolol (LOPRESSOR) 50 MG tablet TAKE ONE TABLET BY MOUTH TWICE DAILY 120 tablet 0   No current facility-administered medications for this visit.   Family History  Problem Relation Age of Onset  . Cancer Brother     colon  . Cancer Sister     colon    . Cancer Sister     breast  . Heart attack Father   . Heart disease Father    Social History   Social History  . Marital Status: Single    Spouse Name: N/A  . Number of Children: N/A  . Years of Education: N/A   Social History Main Topics  . Smoking status: Former Smoker    Types: Cigarettes    Quit date: 03/12/1968  . Smokeless tobacco: Never Used  . Alcohol Use: 0.6 oz/week    1 Glasses of wine per week     Comment: occasional  . Drug Use: No  . Sexual Activity: Not Asked   Other Topics Concern  . None   Social History Narrative   Review of Systems: Review of Systems  Constitutional: Negative for fever.  HENT: Positive for congestion. Negative for hearing loss.   Eyes: Negative for blurred vision.  Respiratory: Negative for shortness of breath.   Cardiovascular: Negative for leg swelling.  Musculoskeletal: Positive for joint pain. Negative for falls.  Neurological: Negative for headaches.    Objective:  Physical Exam: Filed Vitals:   06/21/15 0932  BP: 126/72  Pulse: 60  Temp: 98 F (36.7 C)  TempSrc: Oral  Weight: 181 lb 6.4 oz (82.283 kg)  SpO2: 98%   GENERAL- alert, co-operative, NAD HEENT- Atraumatic, Impacted cerumen in  ear canals bilaterally preventing direct visualization of the tympanic membranes, without significant associated erythema CARDIAC- RRR, no murmurs, rubs or gallops. RESP- CTAB, no wheezes or crackles. EXTREMITIES- Left knee strength 5/5, full ROM, crepitus on ROM, minimal TTP and no laxity SKIN- Warm, dry, No rash or lesion. PSYCH- Normal mood and affect, appropriate thought content and speech.  Assessment & Plan:

## 2015-06-21 NOTE — Patient Instructions (Addendum)
Victoria Campos you are doing a fantastic job taking care of your health! I do not think we need to change your medications today.  We will keep an eye on your joint pains and if concerning symptoms begin or current ones worsen we may consider more extensive treatments. For now you may continue taking ibuprofen up to 3 times daily as needed and flexeril at night as needed. If you find yourself having worsening reaction to these medicines please discontinue them and call us back.  Referrals were sent for mammography and bone density scan, you will be called about scheduling these appointments.  We gave the pneumonia vaccine today Pneumovax (PCV23). This has a good reduction to your risk of the most common bacterial pneumonia for the next several years.  We checked your blood test to make sure your kidney function remains good and that you are not having any adverse effect from your medications. If there are concerning findings I will call you to discuss this.

## 2015-06-22 LAB — BMP8+ANION GAP
Anion Gap: 23 mmol/L — ABNORMAL HIGH (ref 10.0–18.0)
BUN / CREAT RATIO: 14 (ref 12–28)
BUN: 11 mg/dL (ref 8–27)
CALCIUM: 9.5 mg/dL (ref 8.7–10.3)
CHLORIDE: 98 mmol/L (ref 96–106)
CO2: 22 mmol/L (ref 18–29)
Creatinine, Ser: 0.79 mg/dL (ref 0.57–1.00)
GFR, EST AFRICAN AMERICAN: 90 mL/min/{1.73_m2} (ref >59)
GFR, EST NON AFRICAN AMERICAN: 78 mL/min/{1.73_m2} (ref >59)
GLUCOSE: 100 mg/dL — AB (ref 65–99)
POTASSIUM: 3.9 mmol/L (ref 3.5–5.2)
Sodium: 143 mmol/L (ref 134–144)

## 2015-06-22 NOTE — Assessment & Plan Note (Signed)
Assessment: She seems to be doing very well at present and is not falling due to leg weakness. She has some pain worsened with ascending stairs. Overall very functional. Ibuprofen is beneficial in her knee pain. 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 The joint is 5/5 strength with no laxity or catch on examination.  Plan: Recommended she use NSAIDs as needed for pain control Does not need aggressive workup or management at this time.

## 2015-06-22 NOTE — Assessment & Plan Note (Signed)
-  Pneumovax administered 2/2 PNA series -DEXA scan and mammography ordered, she still has not followed up with this

## 2015-06-22 NOTE — Assessment & Plan Note (Signed)
Ongoing problem that recurs every few months. She continues to deny any instrumentation of her ears besides irrigation in our office. She does not consistently use any drops because she does not believe they help significantly.  Ear irrigated today with subsequent visualization of TMs. No significant abnormality observed.

## 2015-06-22 NOTE — Assessment & Plan Note (Signed)
Continue NSAIDs PRN as described for knee arthritis. Overall her knee is a bigger complaint than her back at this time. She also gets mild improvement in her back pain on flexeril but significant drowsiness.   Recommend continued flexeril ONLY AS A QHS PRN medicine

## 2015-06-22 NOTE — Assessment & Plan Note (Signed)
BP Readings from Last 3 Encounters:  06/21/15 126/72  04/19/15 119/81  02/16/15 120/70    Lab Results  Component Value Date   NA 143 06/21/2015   K 3.9 06/21/2015   CREATININE 0.79 06/21/2015    Assessment: Blood pressure control: Controlled Progress toward BP goal:  At goal Comments: Doing great on current medications! 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 Bmet checked today is normal.  Plan: Medications:  continue current medications Other plans: Congratulated Victoria Campos on doing so well. Routine monitoring and continuing her current medication.

## 2015-06-27 NOTE — Progress Notes (Signed)
Case discussed with Dr. Rice at the time of the visit.  We reviewed the resident's history and exam and pertinent patient test results.  I agree with the assessment, diagnosis, and plan of care documented in the resident's note. 

## 2015-07-04 ENCOUNTER — Other Ambulatory Visit: Payer: Self-pay | Admitting: Internal Medicine

## 2015-07-08 ENCOUNTER — Other Ambulatory Visit: Payer: Commercial Managed Care - HMO

## 2015-07-08 ENCOUNTER — Ambulatory Visit: Payer: Commercial Managed Care - HMO

## 2015-07-16 ENCOUNTER — Other Ambulatory Visit: Payer: Self-pay | Admitting: Internal Medicine

## 2015-07-18 NOTE — Telephone Encounter (Signed)
Patient has active prescription for metoprolol succinate from 06/21/15 listed in her chart. This is a duplicate treatment. Why can she not get this from her pharmacy already?

## 2015-07-22 ENCOUNTER — Other Ambulatory Visit: Payer: Self-pay | Admitting: Internal Medicine

## 2015-07-29 ENCOUNTER — Ambulatory Visit: Payer: Commercial Managed Care - HMO | Admitting: Internal Medicine

## 2015-08-01 ENCOUNTER — Other Ambulatory Visit: Payer: Self-pay | Admitting: Internal Medicine

## 2015-08-25 ENCOUNTER — Other Ambulatory Visit: Payer: Self-pay | Admitting: Internal Medicine

## 2015-08-25 NOTE — Telephone Encounter (Signed)
Last appt 4/11; has f/u appt 7/21.

## 2015-09-16 ENCOUNTER — Other Ambulatory Visit: Payer: Self-pay | Admitting: *Deleted

## 2015-09-16 DIAGNOSIS — I10 Essential (primary) hypertension: Secondary | ICD-10-CM

## 2015-09-16 DIAGNOSIS — M47819 Spondylosis without myelopathy or radiculopathy, site unspecified: Secondary | ICD-10-CM

## 2015-09-16 NOTE — Telephone Encounter (Addendum)
Refill request from [facility].Despina Hidden Cassady7/7/20174:05 PM  This is a new pharmacy for patient

## 2015-09-19 MED ORDER — METOPROLOL SUCCINATE ER 50 MG PO TB24: 50.0000 mg | ORAL_TABLET | Freq: Every day | ORAL | Status: DC

## 2015-09-19 MED ORDER — IBUPROFEN 600 MG PO TABS: 600.0000 mg | ORAL_TABLET | Freq: Three times a day (TID) | ORAL | Status: DC | PRN

## 2015-09-19 MED ORDER — ATORVASTATIN CALCIUM 80 MG PO TABS
80.0000 mg | ORAL_TABLET | Freq: Every day | ORAL | Status: DC
Start: 2015-09-19 — End: 2016-06-01

## 2015-09-19 MED ORDER — HYDROCHLOROTHIAZIDE 25 MG PO TABS: 25.0000 mg | ORAL_TABLET | Freq: Every day | ORAL | Status: DC

## 2015-09-19 MED ORDER — CYCLOBENZAPRINE HCL 10 MG PO TABS: 10.0000 mg | ORAL_TABLET | Freq: Every evening | ORAL | Status: DC | PRN

## 2015-09-19 MED ORDER — AMLODIPINE BESYLATE 5 MG PO TABS: 5.0000 mg | ORAL_TABLET | Freq: Every day | ORAL | Status: DC

## 2015-09-23 ENCOUNTER — Ambulatory Visit
Admission: RE | Admit: 2015-09-23 | Discharge: 2015-09-23 | Disposition: A | Discharge status: Home / Self Care | Payer: Commercial Managed Care - HMO | Source: Ambulatory Visit | Attending: Internal Medicine | Admitting: Internal Medicine

## 2015-09-23 DIAGNOSIS — Z780 Asymptomatic menopausal state: Secondary | ICD-10-CM | POA: Diagnosis not present

## 2015-09-23 DIAGNOSIS — Z1231 Encounter for screening mammogram for malignant neoplasm of breast: Secondary | ICD-10-CM | POA: Diagnosis not present

## 2015-09-23 DIAGNOSIS — M85852 Other specified disorders of bone density and structure, left thigh: Secondary | ICD-10-CM | POA: Diagnosis not present

## 2015-09-23 DIAGNOSIS — E2839 Other primary ovarian failure: Secondary | ICD-10-CM

## 2015-09-25 ENCOUNTER — Other Ambulatory Visit: Payer: Self-pay | Admitting: Internal Medicine

## 2015-09-30 ENCOUNTER — Encounter: Payer: Self-pay | Admitting: Internal Medicine

## 2015-09-30 ENCOUNTER — Other Ambulatory Visit: Payer: Self-pay | Admitting: *Deleted

## 2015-09-30 ENCOUNTER — Ambulatory Visit (INDEPENDENT_AMBULATORY_CARE_PROVIDER_SITE_OTHER): Payer: Commercial Managed Care - HMO | Admitting: Internal Medicine

## 2015-09-30 VITALS — BP 138/73 | HR 73 | Temp 98.6°F | Ht 64.5 in | Wt 182.0 lb

## 2015-09-30 DIAGNOSIS — Z0000 Encounter for general adult medical examination without abnormal findings: Secondary | ICD-10-CM | POA: Diagnosis not present

## 2015-09-30 DIAGNOSIS — I10 Essential (primary) hypertension: Secondary | ICD-10-CM | POA: Diagnosis not present

## 2015-09-30 DIAGNOSIS — M1712 Unilateral primary osteoarthritis, left knee: Secondary | ICD-10-CM | POA: Diagnosis not present

## 2015-09-30 NOTE — Progress Notes (Signed)
   CC: Follow up for hypertension  HPI:  [name] is a 65 y.o. female with PMHx detailed below presenting for a routine follow up of her hypertension. She has been feeling very well over the past few months and denies any episodes of chest pain, headaches, or leg swelling. She has not fallen to the ground recently but did stumble on steps and bruised her left knee. She feels that the knee catches or locks in place episodically but has not caused more falls. This is mildly painful and happens about once a month.  See problem based assessment and plan below for additional details.  Past Medical History  Diagnosis Date  . Hypertension   . Hypercholesteremia   . Heart murmur   . Insomnia   . PE (pulmonary embolism)   . Pulmonary embolism ([place]) 09/23/2011    Acute segmental bilateral segmental PE, Dx 09/23/11 with Pos lupus anticoagulant, neg venous dopplers, echo s R Ht strain    . ANXIETY 03/10/2004    Qualifier: Diagnosis of  By: Radene Ou MD, [place]      Review of Systems: Review of Systems  Eyes: Negative for blurred vision.  Respiratory: Negative for shortness of breath.   Cardiovascular: Negative for chest pain.  Musculoskeletal: Positive for joint pain. Negative for falls.  Neurological: Negative for sensory change, weakness and headaches.     Physical Exam: Filed Vitals:   09/30/15 1402  BP: 138/73  Pulse: 73  Temp: 98.6 F (37 C)  TempSrc: Oral  Height: 5' 4.5" (1.638 m)  Weight: 182 lb (82.555 kg)  SpO2: 100%   GENERAL- alert, co-operative, NAD HEENT- Atraumatic, many small skin tags on neck CARDIAC- RRR, no murmurs RESP- CTAB, no wheezes or crackles EXTREMITIES- Left knee strength 5/5, full ROM, crepitus on ROM, minimal TTP and no laxity SKIN- Warm, dry, No rash or lesion PSYCH- Normal mood and affect, appropriate thought content and speech   Filed Vitals:   09/30/15 1402  BP: 138/73  Pulse: 73  Temp: 98.6 F (37 C)  TempSrc: Oral  Height: 5'  4.5" (1.638 m)  Weight: 182 lb (82.555 kg)  SpO2: 100%     Assessment & Plan:   See encounters tab for problem based medical decision making.   Patient discussed with Dr. Evette Doffing

## 2015-09-30 NOTE — Telephone Encounter (Signed)
Patient requesting this medication to be filled thorough [facility]. Thanks!

## 2015-09-30 NOTE — Patient Instructions (Signed)
It was good to see you again today Ms. Victoria Campos. I am glad to hear you are feeling well. Your bone density test showed no osteoporosis and I would not recommend any new treatments. We will continue to follow up about your mammogram since the result should be available any time.  Please call or contact us if you have any problems or questions. Otherwise I hope to see you back in a few months!

## 2015-09-30 NOTE — Assessment & Plan Note (Signed)
Assessment: Her leg pain is not too problematic today but she has had some knee "locking" with somewhat of a fall navigating stairs. This sounds consistent with a meniscus injury. She has no history of trauma or surgery to the knee but degenerative changes can also cause this presentation. Due to the infrequent episodes and mild pain I would not recommend any more aggressive workup or interventions at this time.  Plan: Continue NSAIDs PRN for pain relief

## 2015-09-30 NOTE — Assessment & Plan Note (Signed)
-  DEXA scan has resulted with appropriate T-score -1.1 osteopenia and FRAX risk appropriately low so will not start any treatment at this time.  -Mammogram performed 09/23/15 but has not finally resulted yet, will need to follow up

## 2015-09-30 NOTE — Assessment & Plan Note (Signed)
Assessment: Her blood pressure remains well controlled today. She has no adverse side effects from her current treatment.  Plan: Continue HCTZ 25mg , metoprolol 50mg  BID, amlodipine 5mg  Return to clinic in 3 months

## 2015-10-01 MED ORDER — IBUPROFEN 600 MG PO TABS: 600.0000 mg | ORAL_TABLET | Freq: Four times a day (QID) | ORAL | Status: DC | PRN

## 2015-10-03 NOTE — Progress Notes (Signed)
Internal Medicine Clinic Attending  Case discussed with Dr. Rice at the time of the visit.  We reviewed the resident's history and exam and pertinent patient test results.  I agree with the assessment, diagnosis, and plan of care documented in the resident's note.  

## 2015-10-12 ENCOUNTER — Other Ambulatory Visit: Payer: Self-pay | Admitting: Internal Medicine

## 2015-10-12 DIAGNOSIS — M47819 Spondylosis without myelopathy or radiculopathy, site unspecified: Secondary | ICD-10-CM

## 2015-12-02 ENCOUNTER — Encounter: Payer: Commercial Managed Care - HMO | Admitting: Internal Medicine

## 2016-01-26 ENCOUNTER — Telehealth: Payer: Self-pay | Admitting: Internal Medicine

## 2016-01-26 NOTE — Telephone Encounter (Signed)
Pt requesting medication for dizziness to be refilled. Doesn't know the name. [facility] pharmacy

## 2016-01-26 NOTE — Telephone Encounter (Signed)
Called pt, lm for rtc at first #, called 2nd# a young lady said she would give pt a message Cannot find a med for dizziness in her med hx or med list. Will await a call

## 2016-01-26 NOTE — Telephone Encounter (Signed)
Pt called back, asking to speak with a nurse.

## 2016-01-27 NOTE — Telephone Encounter (Signed)
Pt states she had some of the medicine for dizziness some months ago, she does not know what it was, appt made for monday

## 2016-01-30 ENCOUNTER — Ambulatory Visit: Payer: Commercial Managed Care - HMO

## 2016-02-07 ENCOUNTER — Other Ambulatory Visit: Payer: Self-pay | Admitting: Internal Medicine

## 2016-02-07 DIAGNOSIS — I10 Essential (primary) hypertension: Secondary | ICD-10-CM

## 2016-02-17 ENCOUNTER — Encounter: Payer: Commercial Managed Care - HMO | Admitting: Internal Medicine

## 2016-02-20 ENCOUNTER — Other Ambulatory Visit: Payer: Self-pay | Admitting: Internal Medicine

## 2016-02-20 NOTE — Telephone Encounter (Signed)
hydrochlorothiazide (HYDRODIURIL) 25 MG tablet  ibuprofen (ADVIL,MOTRIN) 600 MG tablet

## 2016-02-21 MED ORDER — IBUPROFEN 600 MG PO TABS: 600.0000 mg | ORAL_TABLET | Freq: Four times a day (QID) | ORAL | 2 refills | Status: DC | PRN

## 2016-02-21 MED ORDER — HYDROCHLOROTHIAZIDE 25 MG PO TABS
25.0000 mg | ORAL_TABLET | Freq: Every day | ORAL | 0 refills | Status: DC
Start: 2016-02-21 — End: 2016-02-23

## 2016-02-23 ENCOUNTER — Other Ambulatory Visit: Payer: Self-pay | Admitting: *Deleted

## 2016-02-23 MED ORDER — IBUPROFEN 600 MG PO TABS: 600.0000 mg | ORAL_TABLET | Freq: Four times a day (QID) | ORAL | 0 refills | Status: AC | PRN

## 2016-02-23 MED ORDER — HYDROCHLOROTHIAZIDE 25 MG PO TABS: 25.0000 mg | ORAL_TABLET | Freq: Every day | ORAL | 0 refills | Status: DC

## 2016-02-23 NOTE — Telephone Encounter (Signed)
Patient called stating Ibuprofen & Hydrochlorothiazide was sent to [facility] but meds will not be available after 15 days. Patient completely out of these meds & requesting 15 day supply to be sent to [facility].

## 2016-02-24 NOTE — Telephone Encounter (Signed)
Called patient informing her that Ibuprofen & HCTZ are ready to p/u @ [facility].

## 2016-03-30 ENCOUNTER — Encounter: Payer: Commercial Managed Care - HMO | Admitting: Internal Medicine

## 2016-05-04 ENCOUNTER — Encounter: Payer: Commercial Managed Care - HMO | Admitting: Internal Medicine

## 2016-06-01 ENCOUNTER — Other Ambulatory Visit: Payer: Self-pay | Admitting: Internal Medicine

## 2016-06-01 DIAGNOSIS — M47815 Spondylosis without myelopathy or radiculopathy, thoracolumbar region: Secondary | ICD-10-CM

## 2016-06-01 DIAGNOSIS — I10 Essential (primary) hypertension: Secondary | ICD-10-CM

## 2016-07-12 ENCOUNTER — Telehealth: Payer: Self-pay | Admitting: Internal Medicine

## 2016-07-12 NOTE — Telephone Encounter (Signed)
APT. REMINDER CALL, LMTCB °

## 2016-07-13 ENCOUNTER — Encounter: Payer: Commercial Managed Care - HMO | Admitting: Internal Medicine

## 2016-08-07 ENCOUNTER — Other Ambulatory Visit: Payer: Self-pay | Admitting: Internal Medicine

## 2016-09-14 ENCOUNTER — Encounter: Payer: Commercial Managed Care - HMO | Admitting: Internal Medicine

## 2016-09-14 ENCOUNTER — Encounter: Payer: Self-pay | Admitting: Internal Medicine

## 2016-10-05 ENCOUNTER — Ambulatory Visit (INDEPENDENT_AMBULATORY_CARE_PROVIDER_SITE_OTHER): Payer: Medicare HMO | Admitting: Internal Medicine

## 2016-10-05 VITALS — BP 149/68 | HR 85 | Temp 98.0°F | Ht 64.5 in | Wt 178.0 lb

## 2016-10-05 DIAGNOSIS — Z79899 Other long term (current) drug therapy: Secondary | ICD-10-CM | POA: Diagnosis not present

## 2016-10-05 DIAGNOSIS — J301 Allergic rhinitis due to pollen: Secondary | ICD-10-CM | POA: Diagnosis not present

## 2016-10-05 DIAGNOSIS — I10 Essential (primary) hypertension: Secondary | ICD-10-CM

## 2016-10-05 DIAGNOSIS — Z87891 Personal history of nicotine dependence: Secondary | ICD-10-CM

## 2016-10-05 DIAGNOSIS — H6123 Impacted cerumen, bilateral: Secondary | ICD-10-CM | POA: Diagnosis not present

## 2016-10-05 DIAGNOSIS — Z8249 Family history of ischemic heart disease and other diseases of the circulatory system: Secondary | ICD-10-CM

## 2016-10-05 DIAGNOSIS — Z791 Long term (current) use of non-steroidal anti-inflammatories (NSAID): Secondary | ICD-10-CM

## 2016-10-05 DIAGNOSIS — M170 Bilateral primary osteoarthritis of knee: Secondary | ICD-10-CM | POA: Diagnosis not present

## 2016-10-05 MED ORDER — HYDROCHLOROTHIAZIDE 25 MG PO TABS: 25.0000 mg | ORAL_TABLET | Freq: Every day | ORAL | 3 refills | Status: DC

## 2016-10-05 MED ORDER — FLUTICASONE PROPIONATE 50 MCG/ACT NA SUSP: 2.0000 | Freq: Every day | NASAL | 2 refills | Status: DC

## 2016-10-05 NOTE — Patient Instructions (Addendum)
It was a pleasure to see you today [name].  You have a large build up of earwax (cerumen) in both ears which was treated today. To slow down this building back up I recommend trying carbemide peroxide (Debrox) ear drops at least once every 2 weeks. This should be available at any local pharmacy.  Your sinus symptoms seem to be allergic. I recommend resuming using fluticasone and you can also use antihistamine medicine such as claritin or zyrtec once daily.  Keep up taking your medicine and staying active. We are checking a blood test to make sure you tolerate the medication appropriately.  Your knee pain is due to osteoarthritis, "wear and tear" kind of joint disease. If this becomes more problematic in the future we could try treatment with a steroid injection or re-evaluate for possible knee replacement surgery.

## 2016-10-05 NOTE — Progress Notes (Signed)
   CC: Sinus congestion, arthritis, and medication refills  HPI:  [name] is a 66 y.o. woman here for her sinus congestion and reports hearing loss due to earwax. She also has knee arthritis and needs several medication refills addressed.   See problem based assessment and plan below for additional details  Past Medical History:  Diagnosis Date  . ANXIETY 03/10/2004   Qualifier: Diagnosis of  By: Radene Ou MD, [place]    . Heart murmur   . Hypercholesteremia   . Hypertension   . Insomnia   . PE (pulmonary embolism)   . Pulmonary embolism ([place]) 09/23/2011   Acute segmental bilateral segmental PE, Dx 09/23/11 with Pos lupus anticoagulant, neg venous dopplers, echo s R Ht strain      Review of Systems:  Review of Systems  Constitutional: Negative for chills and fever.  HENT: Positive for congestion and hearing loss. Negative for ear discharge, ear pain, sinus pain and tinnitus.   Eyes: Negative for blurred vision.  Cardiovascular: Negative for chest pain and leg swelling.  Musculoskeletal: Positive for back pain and joint pain. Negative for falls.  Skin: Negative for rash.    Physical Exam: Physical Exam  Constitutional: She is well-developed, well-nourished, and in no distress. No distress.  HENT:  Bilateral obstructing cerumen in external ear canals  Eyes: Conjunctivae are normal. Right eye exhibits no discharge. Left eye exhibits no discharge.  Cardiovascular: Normal rate and regular rhythm.   Pulmonary/Chest: Effort normal and breath sounds normal.  Musculoskeletal: She exhibits no edema.  Patellar crepitus in R knee, minimally tender on palpation, normal active and passive ROM, no laxity on lachman test, varus or valgus deformity  Lymphadenopathy:    She has no cervical adenopathy.  Skin: No rash noted.    Vitals:   10/05/16 1425  BP: (!) 149/68  Pulse: 85  Temp: 98 F (36.7 C)  TempSrc: Oral  SpO2: 99%  Weight: 178 lb (80.7 kg)  Height: 5' 4.5" (1.638  m)    Assessment & Plan:   See Encounters Tab for problem based charting.  Patient discussed with Dr. Evette Doffing

## 2016-10-06 LAB — BMP8+ANION GAP
ANION GAP: 16 mmol/L (ref 10.0–18.0)
BUN / CREAT RATIO: 14 (ref 12–28)
BUN: 11 mg/dL (ref 8–27)
CO2: 25 mmol/L (ref 20–29)
Calcium: 9.7 mg/dL (ref 8.7–10.3)
Chloride: 103 mmol/L (ref 96–106)
Creatinine, Ser: 0.8 mg/dL (ref 0.57–1.00)
GFR calc Af Amer: 88 mL/min/{1.73_m2} (ref >59)
GFR, EST NON AFRICAN AMERICAN: 77 mL/min/{1.73_m2} (ref >59)
Glucose: 87 mg/dL (ref 65–99)
POTASSIUM: 3.3 mmol/L — AB (ref 3.5–5.2)
SODIUM: 144 mmol/L (ref 134–144)

## 2016-10-07 NOTE — Assessment & Plan Note (Signed)
She is hypertensive today above goal at 149/68. She denies any symptoms of increased leg swelling, orthostatic dizziness, or persistent headache.  She feels like her blood pressure is abnormally high today due to other problems and would like to not start additional medication at this time.  Reorder HCTZ 25mg  daily Bmet today

## 2016-10-07 NOTE — Assessment & Plan Note (Signed)
Bilateral obstructive cerumen in both ear canals on exam today. She denies any use of Q-tips or other instrumentation of her own ears. This problem was last addressed here in April 2017. She is not using any treatment of this problem consistently since that time.  -Irrigation of the ear canals in clinic today There was clearance of both obstructions and TMs were clearly visualized afterwards without any lesions or erythema in the ear canal.  -I recommended use of carbemide peroxide (or equivalent) in the right ear for small residual debris at least 1-2 weeks afterwards. -She can continue using this bilaterally intermittently to prevent reaccumulation of obstructive cerumen.

## 2016-10-07 NOTE — Assessment & Plan Note (Signed)
She has sinus drainage and cough that is productive worst at night. She denies any fever or chills. There is no associated sinus pain or tenderness. She thinks this feels like her usual seasonal allergies that are always worse in the summer. She normally uses flonase for this but does not have any more of this at home.  Seasonal allergic rhinitis  Reordered fluticasone I recommended she could also use OTC anthistamines loratadine or cetirizine daily for these symptoms

## 2016-10-07 NOTE — Assessment & Plan Note (Signed)
She has ongoing osteoarthritis pain which was demonstrated before on knee xrays in 2008 and 2016. 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 She has not fallen due to this knee pain and it is improved with NSAIDs.  Osteoarthritis of bilateral knees, R>L There is no significant effusion present today. I discussed alternate modalities of treatment including steroid injection which she declined. Her symptoms are not currently severe enough that she wants more aggressive treatment.  Continue ibuprofen as needed for knee pain

## 2016-10-08 NOTE — Progress Notes (Signed)
Internal Medicine Clinic Attending  Case discussed with Dr. Rice at the time of the visit.  We reviewed the resident's history and exam and pertinent patient test results.  I agree with the assessment, diagnosis, and plan of care documented in the resident's note.  

## 2016-10-23 ENCOUNTER — Telehealth: Payer: Self-pay | Admitting: Internal Medicine

## 2016-10-23 NOTE — Telephone Encounter (Signed)
Patient stopped by again to drop off a DMV PLACARD FROM to be completed.  Please advise if this can be completed.  If the patient needs an appt please let me know.

## 2016-11-14 ENCOUNTER — Other Ambulatory Visit: Payer: Self-pay | Admitting: Internal Medicine

## 2016-12-21 ENCOUNTER — Ambulatory Visit: Payer: Medicare HMO | Admitting: Internal Medicine

## 2017-01-16 ENCOUNTER — Ambulatory Visit (INDEPENDENT_AMBULATORY_CARE_PROVIDER_SITE_OTHER): Payer: Medicare HMO | Admitting: Internal Medicine

## 2017-01-16 ENCOUNTER — Ambulatory Visit: Payer: Medicare HMO

## 2017-01-16 ENCOUNTER — Encounter: Payer: Self-pay | Admitting: Internal Medicine

## 2017-01-16 VITALS — BP 138/75 | HR 78 | Temp 98.3°F

## 2017-01-16 DIAGNOSIS — I10 Essential (primary) hypertension: Secondary | ICD-10-CM | POA: Diagnosis not present

## 2017-01-16 DIAGNOSIS — M170 Bilateral primary osteoarthritis of knee: Secondary | ICD-10-CM | POA: Diagnosis not present

## 2017-01-16 NOTE — Progress Notes (Signed)
  Ms. Sivertson presented today for blood pressure check. Vital signs were checked with blood pressure of 138/75. She is feeling in good health and is scheduled for continuity clinic visit in 2 days on Friday 11/9. She requests a DMV form to obtain placard for handicapped parking be completed for her to pick up Friday or Monday. This is because of her bilateral knee osteoarthritis making long distance walking painful.  Patient discussed with Dr. Angelia Mould

## 2017-01-18 ENCOUNTER — Other Ambulatory Visit: Payer: Self-pay

## 2017-01-18 ENCOUNTER — Encounter: Payer: Self-pay | Admitting: Internal Medicine

## 2017-01-18 ENCOUNTER — Ambulatory Visit (INDEPENDENT_AMBULATORY_CARE_PROVIDER_SITE_OTHER): Payer: Medicare HMO | Admitting: Internal Medicine

## 2017-01-18 VITALS — BP 143/70 | HR 87 | Temp 98.2°F | Ht 64.5 in | Wt 182.1 lb

## 2017-01-18 DIAGNOSIS — I10 Essential (primary) hypertension: Secondary | ICD-10-CM | POA: Diagnosis not present

## 2017-01-18 DIAGNOSIS — M170 Bilateral primary osteoarthritis of knee: Secondary | ICD-10-CM | POA: Diagnosis not present

## 2017-01-18 NOTE — Patient Instructions (Signed)
It was a pleasure to see you today Ms. Victoria Campos.  We will see you back at the clinic next week. You can come just for a BP check and flu shot and do not need a full physician appointment. If there are any problems on your blood test today like low potassium I will update you on Monday.

## 2017-01-19 LAB — BMP8+ANION GAP
Anion Gap: 15 mmol/L (ref 10.0–18.0)
BUN/Creatinine Ratio: 15 (ref 12–28)
BUN: 12 mg/dL (ref 8–27)
CO2: 27 mmol/L (ref 20–29)
Calcium: 9.7 mg/dL (ref 8.7–10.3)
Chloride: 102 mmol/L (ref 96–106)
Creatinine, Ser: 0.8 mg/dL (ref 0.57–1.00)
GFR calc Af Amer: 88 mL/min/1.73
GFR calc non Af Amer: 76 mL/min/1.73
Glucose: 96 mg/dL (ref 65–99)
Potassium: 3.5 mmol/L (ref 3.5–5.2)
Sodium: 144 mmol/L (ref 134–144)

## 2017-01-21 ENCOUNTER — Encounter: Payer: Self-pay | Admitting: Internal Medicine

## 2017-01-21 ENCOUNTER — Ambulatory Visit (INDEPENDENT_AMBULATORY_CARE_PROVIDER_SITE_OTHER): Payer: Medicare HMO | Admitting: Internal Medicine

## 2017-01-21 VITALS — BP 133/71 | HR 83 | Temp 98.3°F | Ht 64.5 in | Wt 181.9 lb

## 2017-01-21 DIAGNOSIS — Z23 Encounter for immunization: Secondary | ICD-10-CM | POA: Diagnosis not present

## 2017-01-21 DIAGNOSIS — I10 Essential (primary) hypertension: Secondary | ICD-10-CM | POA: Diagnosis not present

## 2017-01-21 DIAGNOSIS — Z024 Encounter for examination for driving license: Secondary | ICD-10-CM | POA: Diagnosis not present

## 2017-01-21 NOTE — Progress Notes (Signed)
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 Blood pressure is 133/71 today. She is also getting a flu shot today. She is feeling fine and playing with her granddaughter. Chilon spoke with her and will send the blood pressure reports for her.

## 2017-01-21 NOTE — Assessment & Plan Note (Signed)
She is hypertensive today with blood pressure of 143/70 on HCTZ 25mg , amlodipine 5mg  metoprolol 50mg . She was 138/75 on a check two days ago. She wants to return to clinic for a recheck next week because she thinks is is temporarily higher from stress. She has trace pedal edema but is otherwise asymptomatic.  Hypertension, mildly above goal today Plan: Bmet today She plans to return on Monday for 3rd check for DoT exam compliance If she remains hypertensive we would likely need a 3rd medication, possible a less selective beta blocker is another option

## 2017-01-21 NOTE — Progress Notes (Signed)
Internal Medicine Clinic Attending  Case discussed with Dr. Rice at the time of the visit.  We reviewed the resident's history and exam and pertinent patient test results.  I agree with the assessment, diagnosis, and plan of care documented in the resident's note.  

## 2017-01-21 NOTE — Assessment & Plan Note (Addendum)
She remains very active despite bilateral knee pain. She has not needed a cane for assistance since our last encounter. She reports no new falls. Strength is normal on examination today. This is reasonably controlled with ibuprofen PRN

## 2017-01-21 NOTE — Progress Notes (Signed)
   CC: Follow up visit for hypertension and medication refills  HPI:  [name] is a 66 y.o. female with PMHx detailed below presenting for hypertension follow up without acute complaints.  See problem based assessment and plan below for additional details.  Essential hypertension She is hypertensive today with blood pressure of 143/70 on HCTZ 25mg , amlodipine 5mg  metoprolol 50mg . She was 138/75 on a check two days ago. She wants to return to clinic for a recheck next week because she thinks is is temporarily higher from stress. She has trace pedal edema but is otherwise asymptomatic.  Hypertension, mildly above goal today Plan: Bmet today She plans to return on Monday for 3rd check for DoT exam compliance If she remains hypertensive we would likely need a 3rd medication, possible a less selective beta blocker is another option  Osteoarthritis of both knees She remains very active despite bilateral knee pain. She has not needed a cane for assistance since our last encounter. She reports no new falls. Strength is normal on examination today. This is reasonably controlled with ibuprofen PRN    Past Medical History:  Diagnosis Date  . ANXIETY 03/10/2004   Qualifier: Diagnosis of  By: Radene Ou MD, [place]    . Heart murmur   . Hypercholesteremia   . Hypertension   . Insomnia   . PE (pulmonary embolism)   . Pulmonary embolism ([place]) 09/23/2011   Acute segmental bilateral segmental PE, Dx 09/23/11 with Pos lupus anticoagulant, neg venous dopplers, echo s R Ht strain      Review of Systems: Review of Systems  Constitutional: Negative for chills and fever.  Eyes: Negative for blurred vision.  Respiratory: Negative for shortness of breath.   Cardiovascular: Positive for leg swelling.  Musculoskeletal: Positive for joint pain. Negative for falls.     Physical Exam: Vitals:   01/18/17 1601  BP: (!) 143/70  Pulse: 87  Temp: 98.2 F (36.8 C)  TempSrc: Oral  SpO2: 99%    Weight: 182 lb 1.6 oz (82.6 kg)  Height: 5' 4.5" (1.638 m)   GENERAL- alert, co-operative, NAD HEENT- Oral mucosa appears moist CARDIAC- RRR, no murmurs, rubs or gallops. RESP- CTAB, no wheezes or crackles. EXTREMITIES- Bilateral knee crepitus L>R, trace pedal edema SKIN- Warm, dry, No rash or lesion.   Assessment & Plan:   See encounters tab for problem based medical decision making.   Patient discussed with Dr. Beryle Beams

## 2017-01-21 NOTE — Progress Notes (Signed)
Medicine attending: Medical history, presenting problems, physical findings, and medications, reviewed with resident physician Dr Christopher Rice on the day of the patient visit and I concur with his evaluation and management plan. 

## 2017-02-04 NOTE — Progress Notes (Signed)
Internal Medicine Clinic Attending  Case discussed with Dr. Rice at the time of the visit.  We reviewed the resident's history and exam and pertinent patient test results.  I agree with the assessment, diagnosis, and plan of care documented in the resident's note.  

## 2017-05-28 ENCOUNTER — Other Ambulatory Visit: Payer: Self-pay | Admitting: Internal Medicine

## 2017-05-28 DIAGNOSIS — I10 Essential (primary) hypertension: Secondary | ICD-10-CM

## 2017-05-29 ENCOUNTER — Other Ambulatory Visit: Payer: Self-pay | Admitting: Internal Medicine

## 2017-05-29 DIAGNOSIS — I10 Essential (primary) hypertension: Secondary | ICD-10-CM

## 2017-06-21 ENCOUNTER — Telehealth: Payer: Self-pay | Admitting: Internal Medicine

## 2017-06-21 DIAGNOSIS — I10 Essential (primary) hypertension: Secondary | ICD-10-CM

## 2017-06-24 NOTE — Telephone Encounter (Signed)
Appt scheduled for 07/10/17 in [facility] as Dr. Benjamine Mola will be there for the month.  Victoria Campos

## 2017-07-10 ENCOUNTER — Ambulatory Visit (INDEPENDENT_AMBULATORY_CARE_PROVIDER_SITE_OTHER): Payer: Medicare HMO | Admitting: Internal Medicine

## 2017-07-10 VITALS — BP 118/71 | HR 71 | Temp 98.6°F | Wt 179.0 lb

## 2017-07-10 DIAGNOSIS — H6123 Impacted cerumen, bilateral: Secondary | ICD-10-CM

## 2017-07-10 DIAGNOSIS — J302 Other seasonal allergic rhinitis: Secondary | ICD-10-CM | POA: Diagnosis not present

## 2017-07-10 DIAGNOSIS — I10 Essential (primary) hypertension: Secondary | ICD-10-CM

## 2017-07-10 DIAGNOSIS — Z791 Long term (current) use of non-steroidal anti-inflammatories (NSAID): Secondary | ICD-10-CM | POA: Diagnosis not present

## 2017-07-10 DIAGNOSIS — Z79899 Other long term (current) drug therapy: Secondary | ICD-10-CM

## 2017-07-10 DIAGNOSIS — J301 Allergic rhinitis due to pollen: Secondary | ICD-10-CM

## 2017-07-10 DIAGNOSIS — M1711 Unilateral primary osteoarthritis, right knee: Secondary | ICD-10-CM

## 2017-07-10 MED ORDER — HYDROCHLOROTHIAZIDE 25 MG PO TABS: 25.0000 mg | ORAL_TABLET | Freq: Every day | ORAL | 0 refills | Status: DC

## 2017-07-10 NOTE — Assessment & Plan Note (Addendum)
She has recurrent bilateral cerumen accumulation. She has previously documented mild bilateral sensorineural hearing loss which is more likely responsible for this complaint than complete ear canal obstruction. This is a recurrent problem without associated complications. She has tried home irrigation without benefit so we will again treat this in clinic with irrigation today.  Plan: -Irrigation of the ear canals in clinic today -I recommended use of carbemide peroxide (or equivalent) at home for reaccumulation of obstructive cerumen  Repeat examination of ears allows clear visualization of both tympanic membranes with no residual cerumen and no apparent inflammation.

## 2017-07-10 NOTE — Progress Notes (Signed)
CC: Ears obstructed  HPI:  Victoria Campos is a 67 y.o. female with PMHx detailed below presenting with bilateral hearing loss and feeling of stuffiness she attributes to earwax accumulation. This problem is common for her and had irrigation of both ear canals last year as well. She has tried using over the counter dropper bottle style flushes without much benefit. She denies mechanical instrumentation of her ears. She does not use any medications for this problem. She has occasional sinus congestion and takes OTC antihistamines for this. She denies any associated pain.  We also discussed medication refills for her hypertension. She has stable chronic osteoarthritis of the right knee for which she is taking intermittent ibuprofen with moderate relief.  See problem based assessment and plan below for additional details.  Cerumen impaction She has recurrent bilateral cerumen accumulation. She has previously documented mild bilateral sensorineural hearing loss which is more likely responsible for this complaint than complete ear canal obstruction. This is a recurrent problem without associated complications. She has tried home irrigation without benefit so we will again treat this in clinic with irrigation today.  Plan: -Irrigation of the ear canals in clinic today -I recommended use of carbemide peroxide (or equivalent) at home for reaccumulation of obstructive cerumen  Repeat examination of ears allows clear visualization of both tympanic membranes with no residual cerumen and no apparent inflammation.  Essential hypertension Blood pressure is well controlled today at 118/71 on her regimen of amlodipine 5mg , HCTZ 25mg , and metoprolol succinate 50mg . She has improvement in ankle swelling and none is apparent today. Labs were normal when checked in November with no change in treatment.  Plan: -Continue current treatment -RTC in 6 months for labs and recheck  ALLERGIC RHINITIS,  SEASONAL She is experiencing mildly increased sinus congestion and rhinitis without significant cough or sinus tenderness at this time. She is doing well with flonase for this and what sounds like intermittent use of loratadine. There are no concerning features for infection at this time.  Plan: -Continue daily flonase PRN -Loratadine OTC 10mg  daily during spring/summer for increased symptoms   Past Medical History:  Diagnosis Date  . ANXIETY 03/10/2004   Qualifier: Diagnosis of  By: Radene Ou MD, [place]    . Heart murmur   . Hypercholesteremia   . Hypertension   . Insomnia   . PE (pulmonary embolism)   . Pulmonary embolism ([place]) 09/23/2011   Acute segmental bilateral segmental PE, Dx 09/23/11 with Pos lupus anticoagulant, neg venous dopplers, echo s R Ht strain      Review of Systems: Review of Systems  Constitutional: Negative for chills and fever.  HENT: Positive for hearing loss. Negative for ear discharge and ear pain.   Eyes: Negative for blurred vision.  Respiratory: Negative for shortness of breath.   Cardiovascular: Negative for leg swelling.  Gastrointestinal: Negative for diarrhea.  Musculoskeletal: Positive for joint pain. Negative for falls.  Skin: Negative for rash.  Neurological: Negative for dizziness.  Endo/Heme/Allergies: Positive for environmental allergies.     Physical Exam: Vitals:   07/10/17 0906  BP: 118/71  Pulse: 71  Temp: 98.6 F (37 C)  TempSrc: Oral  SpO2: 97%  Weight: 179 lb (81.2 kg)   GENERAL- alert, co-operative, NAD HEENT- Oral mucosa appears moist, no cervical adenopathy, no sinus tenderness, tympanic membranes cannot be visualized on either side due to obstructing cerumen CARDIAC- RRR, no murmurs, rubs or gallops RESP- CTAB, no wheezes or crackles ABDOMEN- Soft, nontender EXTREMITIES- DP pulses 2+  bilaterally, no pedal edema, mild crepitus in R knee with preserved range of motion SKIN- Warm, dry, No rash or lesion. PSYCH- Normal  mood and affect, appropriate thought content and speech.   Assessment & Plan:   See encounters tab for problem based medical decision making.   Patient discussed with Dr. Beryle Beams

## 2017-07-10 NOTE — Patient Instructions (Signed)
It was good to see you again today Ms. Toney Rakes.  I recommend no new changes to your blood pressure medications. You are doing a fantastic job staying active and taking these medicines!  We cleaned your ears today with no residual obstruction. I recommend trying use of ear drops such as carbemide peroxide (Debrox) daily in each ear when you notice this coming back to help mobilize your ear wax.  Fluticasone (Flonase) nasal spray should be helpful with seasonal congestion. You can also add an antihistamine as needed.

## 2017-07-10 NOTE — Assessment & Plan Note (Signed)
Blood pressure is well controlled today at 118/71 on her regimen of amlodipine 5mg , HCTZ 25mg , and metoprolol succinate 50mg . She has improvement in ankle swelling and none is apparent today. Labs were normal when checked in November with no change in treatment.  Plan: -Continue current treatment -RTC in 6 months for labs and recheck

## 2017-07-10 NOTE — Assessment & Plan Note (Signed)
She is experiencing mildly increased sinus congestion and rhinitis without significant cough or sinus tenderness at this time. She is doing well with flonase for this and what sounds like intermittent use of loratadine. There are no concerning features for infection at this time.  Plan: -Continue daily flonase PRN -Loratadine OTC 10mg  daily during spring/summer for increased symptoms

## 2017-07-11 NOTE — Progress Notes (Signed)
Medicine attending: Medical history, presenting problems, physical findings, and medications, reviewed with resident physician Dr Laurance Flatten on the day of the patient visit and I concur with his evaluation and management plan.

## 2017-08-23 ENCOUNTER — Ambulatory Visit: Payer: Medicare HMO | Admitting: Internal Medicine

## 2017-08-23 NOTE — Progress Notes (Signed)
Opened in error

## 2017-09-04 ENCOUNTER — Other Ambulatory Visit: Payer: Self-pay

## 2017-09-04 ENCOUNTER — Ambulatory Visit (INDEPENDENT_AMBULATORY_CARE_PROVIDER_SITE_OTHER)
Admission: EM | Admit: 2017-09-04 | Discharge: 2017-09-04 | Disposition: A | Discharge status: Home / Self Care | Payer: Medicare HMO | Source: Home / Self Care

## 2017-09-04 ENCOUNTER — Encounter (HOSPITAL_COMMUNITY): Payer: Self-pay | Admitting: Emergency Medicine

## 2017-09-04 ENCOUNTER — Observation Stay (HOSPITAL_COMMUNITY)
Admission: EM | Admit: 2017-09-04 | Discharge: 2017-09-06 | Disposition: A | Discharge status: Home / Self Care | Payer: Medicare HMO | Attending: Student in an Organized Health Care Education/Training Program | Admitting: Student in an Organized Health Care Education/Training Program

## 2017-09-04 ENCOUNTER — Emergency Department (HOSPITAL_COMMUNITY): Payer: Medicare HMO

## 2017-09-04 ENCOUNTER — Encounter (HOSPITAL_COMMUNITY): Payer: Self-pay | Admitting: Family Medicine

## 2017-09-04 DIAGNOSIS — Z79899 Other long term (current) drug therapy: Secondary | ICD-10-CM | POA: Insufficient documentation

## 2017-09-04 DIAGNOSIS — I350 Nonrheumatic aortic (valve) stenosis: Secondary | ICD-10-CM | POA: Insufficient documentation

## 2017-09-04 DIAGNOSIS — I10 Essential (primary) hypertension: Secondary | ICD-10-CM | POA: Diagnosis present

## 2017-09-04 DIAGNOSIS — R079 Chest pain, unspecified: Secondary | ICD-10-CM

## 2017-09-04 DIAGNOSIS — R110 Nausea: Secondary | ICD-10-CM | POA: Diagnosis not present

## 2017-09-04 DIAGNOSIS — R200 Anesthesia of skin: Secondary | ICD-10-CM | POA: Diagnosis not present

## 2017-09-04 DIAGNOSIS — R011 Cardiac murmur, unspecified: Secondary | ICD-10-CM | POA: Insufficient documentation

## 2017-09-04 DIAGNOSIS — Z7982 Long term (current) use of aspirin: Secondary | ICD-10-CM | POA: Diagnosis not present

## 2017-09-04 DIAGNOSIS — R0789 Other chest pain: Principal | ICD-10-CM | POA: Insufficient documentation

## 2017-09-04 DIAGNOSIS — Z8249 Family history of ischemic heart disease and other diseases of the circulatory system: Secondary | ICD-10-CM | POA: Diagnosis not present

## 2017-09-04 DIAGNOSIS — Z87891 Personal history of nicotine dependence: Secondary | ICD-10-CM | POA: Insufficient documentation

## 2017-09-04 DIAGNOSIS — R202 Paresthesia of skin: Secondary | ICD-10-CM | POA: Diagnosis not present

## 2017-09-04 DIAGNOSIS — R072 Precordial pain: Secondary | ICD-10-CM | POA: Diagnosis not present

## 2017-09-04 DIAGNOSIS — E785 Hyperlipidemia, unspecified: Secondary | ICD-10-CM | POA: Diagnosis present

## 2017-09-04 DIAGNOSIS — M79602 Pain in left arm: Secondary | ICD-10-CM | POA: Diagnosis not present

## 2017-09-04 LAB — CBC
HCT: 37.2 % (ref 36.0–46.0)
HEMOGLOBIN: 12.4 g/dL (ref 12.0–15.0)
MCH: 29 pg (ref 26.0–34.0)
MCHC: 33.3 g/dL (ref 30.0–36.0)
MCV: 86.9 fL (ref 78.0–100.0)
Platelets: 261 10*3/uL (ref 150–400)
RBC: 4.28 MIL/uL (ref 3.87–5.11)
RDW: 13 % (ref 11.5–15.5)
WBC: 5.8 10*3/uL (ref 4.0–10.5)

## 2017-09-04 LAB — BASIC METABOLIC PANEL
ANION GAP: 8 (ref 5–15)
BUN: 11 mg/dL (ref 8–23)
CHLORIDE: 105 mmol/L (ref 98–111)
CO2: 28 mmol/L (ref 22–32)
Calcium: 9.4 mg/dL (ref 8.9–10.3)
Creatinine, Ser: 1.01 mg/dL — ABNORMAL HIGH (ref 0.44–1.00)
GFR calc Af Amer: >60 mL/min (ref >60)
GFR calc non Af Amer: 56 mL/min — ABNORMAL LOW (ref >60)
Glucose, Bld: 98 mg/dL (ref 70–99)
POTASSIUM: 3.4 mmol/L — AB (ref 3.5–5.1)
Sodium: 141 mmol/L (ref 135–145)

## 2017-09-04 LAB — I-STAT TROPONIN, ED: TROPONIN I, POC: 0 ng/mL (ref 0.00–0.08)

## 2017-09-04 NOTE — ED Notes (Signed)
Pt c/o chest heaviness and L arm numbness, hx of PE, htn, hyperlipidemia. Dr. Joseph Art at bedside. Sending patient to the ER.

## 2017-09-04 NOTE — ED Triage Notes (Signed)
Patient with left arm pain, left shoulder, chest pressure.  No shortness of breath.  No nausea or vomiting.  She states that it started last Thursday.

## 2017-09-04 NOTE — Discharge Instructions (Addendum)
Being sent to ED by wheel chair for R/O MI

## 2017-09-04 NOTE — ED Provider Notes (Signed)
[place]   [id number] 09/04/17 Arrival Time: 1927   SUBJECTIVE:  Victoria Campos is a 67 y.o. female who presents to the urgent care with complaint of Lt arm  Feeling numb and Chest feeling heavy  Risk factors for CAD:  Cholesterol, hypertension, family hx (father)  Patient developed the 4/10 substernal pressure and left arm numbness this morning along with mild nausea.  No diaphoresis, SOB, leg pain, abdominal pain.  H/O PE but this feels quite different.  No longer on anticoagulation.  Past Medical History:  Diagnosis Date  . ANXIETY 03/10/2004   Qualifier: Diagnosis of  By: Radene Ou MD, [place]    . Heart murmur   . Hypercholesteremia   . Hypertension   . Insomnia   . PE (pulmonary embolism)   . Pulmonary embolism ([place]) 09/23/2011   Acute segmental bilateral segmental PE, Dx 09/23/11 with Pos lupus anticoagulant, neg venous dopplers, echo s R Ht strain     Family History  Problem Relation Age of Onset  . Cancer Brother        colon  . Cancer Sister        colon  . Cancer Sister        breast  . Heart attack Father   . Heart disease Father    Social History   Socioeconomic History  . Marital status: Single    Spouse name: Not on file  . Number of children: Not on file  . Years of education: Not on file  . Highest education level: Not on file  Occupational History  . Not on file  Social Needs  . Financial resource strain: Not on file  . Food insecurity:    Worry: Not on file    Inability: Not on file  . Transportation needs:    Medical: Not on file    Non-medical: Not on file  Tobacco Use  . Smoking status: Former Smoker    Types: Cigarettes    Last attempt to quit: 03/12/1968    Years since quitting: 49.5  . Smokeless tobacco: Never Used  Substance and Sexual Activity  . Alcohol use: Yes    Alcohol/week: 0.6 oz    Types: 1 Glasses of wine per week    Comment: occasional  . Drug use: No  . Sexual activity: Not on file  Lifestyle  .  Physical activity:    Days per week: Not on file    Minutes per session: Not on file  . Stress: Not on file  Relationships  . Social connections:    Talks on phone: Not on file    Gets together: Not on file    Attends religious service: Not on file    Active member of club or organization: Not on file    Attends meetings of clubs or organizations: Not on file    Relationship status: Not on file  . Intimate partner violence:    Fear of current or ex partner: Not on file    Emotionally abused: Not on file    Physically abused: Not on file    Forced sexual activity: Not on file  Other Topics Concern  . Not on file  Social History Narrative  . Not on file   No outpatient medications have been marked as taking for the 09/04/17 encounter [facility] Encounter).   Allergies  Allergen Reactions  . Sulfamethoxazole-Trimethoprim Rash    REACTION: red rash      ROS: As per HPI, remainder of ROS negative.  OBJECTIVE:   Vitals:   09/04/17 1931  BP: (!) 166/93  Pulse: 97  Resp: 18  SpO2: 100%     General appearance: alert; no distress Eyes: PERRL; EOMI; conjunctiva normal HENT: normocephalic; atraumatic;  oral mucosa normal Neck: supple Lungs: clear to auscultation bilaterally Heart: regular rate and rhythm; 2/6 systolic Murmur at RSB Abdomen: soft, non-tender; bowel sounds normal; no masses or organomegaly; no guarding or rebound tenderness Back: no CVA tenderness Extremities: no cyanosis or edema; symmetrical with no gross deformities; no edema or leg tenderness Skin: warm and dry Neurologic: normal gait; grossly normal Psychological: alert and cooperative; normal mood and affect      Labs:  Results for orders placed or performed in visit on 01/18/17  BMP8+Anion Gap  Result Value Ref Range   Glucose 96 65 - 99 mg/dL   BUN 12 8 - 27 mg/dL   Creatinine, Ser 0.80 0.57 - 1.00 mg/dL   GFR calc non Af Amer 76 >59 mL/min/1.73   GFR calc Af Amer 88 >59 mL/min/1.73    BUN/Creatinine Ratio 15 12 - 28   Sodium 144 134 - 144 mmol/L   Potassium 3.5 3.5 - 5.2 mmol/L   Chloride 102 96 - 106 mmol/L   CO2 27 20 - 29 mmol/L   Anion Gap 15.0 10.0 - 18.0 mmol/L   Calcium 9.7 8.7 - 10.3 mg/dL    Labs Reviewed - No data to display  No results found.   EKG shows QS in V1-3   ASSESSMENT & PLAN:  1. Chest pain, unspecified type   Being sent to ED by wheel chair for R/O MI  No orders of the defined types were placed in this encounter.   Reviewed expectations re: course of current medical issues. Questions answered. Outlined signs and symptoms indicating need for more acute intervention. Patient verbalized understanding. After Visit Summary given.      Robyn Haber, MD 09/04/17 1942

## 2017-09-05 ENCOUNTER — Encounter (HOSPITAL_COMMUNITY): Payer: Self-pay | Admitting: Internal Medicine

## 2017-09-05 ENCOUNTER — Other Ambulatory Visit: Payer: Self-pay

## 2017-09-05 ENCOUNTER — Other Ambulatory Visit (HOSPITAL_COMMUNITY): Payer: Medicare HMO

## 2017-09-05 DIAGNOSIS — Z6379 Other stressful life events affecting family and household: Secondary | ICD-10-CM | POA: Diagnosis not present

## 2017-09-05 DIAGNOSIS — E785 Hyperlipidemia, unspecified: Secondary | ICD-10-CM | POA: Diagnosis not present

## 2017-09-05 DIAGNOSIS — Z882 Allergy status to sulfonamides status: Secondary | ICD-10-CM

## 2017-09-05 DIAGNOSIS — Z86711 Personal history of pulmonary embolism: Secondary | ICD-10-CM | POA: Diagnosis not present

## 2017-09-05 DIAGNOSIS — R0789 Other chest pain: Secondary | ICD-10-CM | POA: Diagnosis not present

## 2017-09-05 DIAGNOSIS — I10 Essential (primary) hypertension: Secondary | ICD-10-CM

## 2017-09-05 DIAGNOSIS — Z79899 Other long term (current) drug therapy: Secondary | ICD-10-CM | POA: Diagnosis not present

## 2017-09-05 DIAGNOSIS — K219 Gastro-esophageal reflux disease without esophagitis: Secondary | ICD-10-CM

## 2017-09-05 DIAGNOSIS — Z8249 Family history of ischemic heart disease and other diseases of the circulatory system: Secondary | ICD-10-CM | POA: Diagnosis not present

## 2017-09-05 DIAGNOSIS — R072 Precordial pain: Secondary | ICD-10-CM

## 2017-09-05 DIAGNOSIS — R079 Chest pain, unspecified: Secondary | ICD-10-CM | POA: Diagnosis present

## 2017-09-05 DIAGNOSIS — Z87891 Personal history of nicotine dependence: Secondary | ICD-10-CM

## 2017-09-05 DIAGNOSIS — R011 Cardiac murmur, unspecified: Secondary | ICD-10-CM

## 2017-09-05 DIAGNOSIS — Z7982 Long term (current) use of aspirin: Secondary | ICD-10-CM | POA: Diagnosis not present

## 2017-09-05 DIAGNOSIS — I350 Nonrheumatic aortic (valve) stenosis: Secondary | ICD-10-CM | POA: Diagnosis not present

## 2017-09-05 LAB — LIPID PANEL
Cholesterol: 146 mg/dL (ref 0–200)
HDL: 44 mg/dL (ref >40)
LDL CALC: 89 mg/dL (ref 0–99)
Total CHOL/HDL Ratio: 3.3 RATIO
Triglycerides: 67 mg/dL (ref <150)
VLDL: 13 mg/dL (ref 0–40)

## 2017-09-05 LAB — TROPONIN I: Troponin I: <0.03 ng/mL (ref <0.03)

## 2017-09-05 LAB — HEMOGLOBIN A1C
HEMOGLOBIN A1C: 5.4 % (ref 4.8–5.6)
Mean Plasma Glucose: 108.28 mg/dL

## 2017-09-05 LAB — HIV ANTIBODY (ROUTINE TESTING W REFLEX): HIV SCREEN 4TH GENERATION: NONREACTIVE

## 2017-09-05 MED ORDER — ACETAMINOPHEN 650 MG RE SUPP: 650.0000 mg | Freq: Four times a day (QID) | RECTAL | Status: DC | PRN

## 2017-09-05 MED ORDER — AMLODIPINE BESYLATE 5 MG PO TABS
5.0000 mg | ORAL_TABLET | Freq: Every day | ORAL | Status: DC
  Administered 2017-09-05: 5 mg via ORAL
  Filled 2017-09-05: qty 1

## 2017-09-05 MED ORDER — ATORVASTATIN CALCIUM 80 MG PO TABS
80.0000 mg | ORAL_TABLET | Freq: Every day | ORAL | Status: DC
  Administered 2017-09-05 – 2017-09-06 (×2): 80 mg via ORAL
  Filled 2017-09-05 (×2): qty 1

## 2017-09-05 MED ORDER — POTASSIUM CHLORIDE CRYS ER 20 MEQ PO TBCR
40.0000 meq | EXTENDED_RELEASE_TABLET | Freq: Once | ORAL | Status: AC
  Administered 2017-09-05: 40 meq via ORAL
  Filled 2017-09-05: qty 2

## 2017-09-05 MED ORDER — HYDROCHLOROTHIAZIDE 25 MG PO TABS
25.0000 mg | ORAL_TABLET | Freq: Every day | ORAL | Status: DC
  Administered 2017-09-05 – 2017-09-06 (×2): 25 mg via ORAL
  Filled 2017-09-05 (×2): qty 1

## 2017-09-05 MED ORDER — ACETAMINOPHEN 325 MG PO TABS: 650.0000 mg | ORAL_TABLET | Freq: Four times a day (QID) | ORAL | Status: DC | PRN

## 2017-09-05 MED ORDER — METOPROLOL SUCCINATE ER 50 MG PO TB24
50.0000 mg | ORAL_TABLET | Freq: Every day | ORAL | Status: DC
  Administered 2017-09-05 – 2017-09-06 (×2): 50 mg via ORAL
  Filled 2017-09-05 (×2): qty 1

## 2017-09-05 MED ORDER — FLUTICASONE PROPIONATE 50 MCG/ACT NA SUSP
2.0000 | Freq: Every day | NASAL | Status: DC | PRN
  Filled 2017-09-05: qty 16

## 2017-09-05 MED ORDER — ENOXAPARIN SODIUM 40 MG/0.4ML ~~LOC~~ SOLN
40.0000 mg | SUBCUTANEOUS | Status: DC
  Filled 2017-09-05 (×2): qty 0.4

## 2017-09-05 MED ORDER — ASPIRIN 81 MG PO CHEW
81.0000 mg | CHEWABLE_TABLET | Freq: Every day | ORAL | Status: DC
  Administered 2017-09-05 – 2017-09-06 (×2): 81 mg via ORAL
  Filled 2017-09-05 (×2): qty 1

## 2017-09-05 NOTE — Consult Note (Signed)
Cardiology Consultation:   Patient ID: Victoria Campos; [id number]; Aug 04, 1950   Admit date: 09/04/2017 Date of Consult: 09/05/2017  Primary Cardiologist: New    Patient Profile:   Victoria Campos is a 67 y.o. female with a hx of pulmonary embolus, hypertension, hyperlipidemia, anxiety who is being seen today for the evaluation of chest pain at the request of Axel Filler MD.  History of Present Illness:   Echocardiogram July 2013 showed normal LV systolic function and mild diastolic dysfunction.  Patient developed chest pain yesterday evening.  It was described as a pressure that was substernal radiating to her left shoulder.  No associated symptoms.  Not pleuritic or positional.  Lasted 15 minutes and resolved.  Note she has had this pain at the time her father passed away 30 years ago.  She had it approximately 1 month ago when her grandson passed away.  She typically does not have dyspnea on exertion, orthopnea, PND, pedal edema or exertional chest pain.  Because of the above cardiology asked to evaluate.  Past Medical History:  Diagnosis Date  . ANXIETY 03/10/2004   Qualifier: Diagnosis of  By: Radene Ou MD, [place]    . Heart murmur   . Hypercholesteremia   . Hypertension   . Insomnia   . Pulmonary embolism ([place]) 09/23/2011   Acute segmental bilateral segmental PE, neg dopplers, neg coag w/u     Past Surgical History:  Procedure Laterality Date  . arm surgery     torn ligamint  . bunyon     removed  . tubal ligation       Inpatient Medications: Scheduled Meds: . amLODipine  5 mg Oral Daily  . aspirin  81 mg Oral Daily  . atorvastatin  80 mg Oral Daily  . enoxaparin (LOVENOX) injection  40 mg Subcutaneous Q24H  . hydrochlorothiazide  25 mg Oral Daily  . metoprolol succinate  50 mg Oral Daily   Continuous Infusions:  PRN Meds: acetaminophen **OR** acetaminophen, fluticasone  Allergies:    Allergies  Allergen Reactions  . Sulfamethoxazole-Trimethoprim  Rash    Red rashes    Social History:   Social History   Socioeconomic History  . Marital status: Single    Spouse name: Not on file  . Number of children: Not on file  . Years of education: Not on file  . Highest education level: Not on file  Occupational History  . Not on file  Social Needs  . Financial resource strain: Not on file  . Food insecurity:    Worry: Not on file    Inability: Not on file  . Transportation needs:    Medical: Not on file    Non-medical: Not on file  Tobacco Use  . Smoking status: Former Smoker    Types: Cigarettes    Last attempt to quit: 03/12/1968    Years since quitting: 49.5  . Smokeless tobacco: Never Used  Substance and Sexual Activity  . Alcohol use: Yes    Alcohol/week: 0.6 oz    Types: 1 Glasses of wine per week    Comment: occasional  . Drug use: No  . Sexual activity: Not on file  Lifestyle  . Physical activity:    Days per week: Not on file    Minutes per session: Not on file  . Stress: Not on file  Relationships  . Social connections:    Talks on phone: Not on file    Gets together: Not on file    Attends  religious service: Not on file    Active member of club or organization: Not on file    Attends meetings of clubs or organizations: Not on file    Relationship status: Not on file  . Intimate partner violence:    Fear of current or ex partner: Not on file    Emotionally abused: Not on file    Physically abused: Not on file    Forced sexual activity: Not on file  Other Topics Concern  . Not on file  Social History Narrative  . Not on file    Family History:    Family History  Problem Relation Age of Onset  . Cancer Brother        colon  . Cancer Sister        colon  . Cancer Sister        breast  . Heart attack Father        died at age 28  . Heart disease Father      ROS:  Please see the history of present illness.  Patient denies fevers, chills, productive cough, hemoptysis, melena or  hematochezia. All other ROS reviewed and negative.     Physical Exam/Data:   Vitals:   09/04/17 2218 09/04/17 2345 09/05/17 0129 09/05/17 1324  BP: (!) 150/77 (!) 147/87 (!) 147/78 (!) 150/73  Pulse: 84 86 83 70  Resp: 16 19    Temp: 98.3 F (36.8 C)  97.6 F (36.4 C) 98.1 F (36.7 C)  TempSrc: Oral  Oral Oral  SpO2: 97% 97% 97% 94%  Weight:   181 lb 14.4 oz (82.5 kg)   Height:   5' 4.5" (1.638 m)     Intake/Output Summary (Last 24 hours) at 09/05/2017 1430 Last data filed at 09/05/2017 0800 Gross per 24 hour  Intake -  Output 300 ml  Net -300 ml   Filed Weights   09/05/17 0129  Weight: 181 lb 14.4 oz (82.5 kg)   Body mass index is 30.74 kg/m.  General:  Well nourished, well developed, in no acute distress HEENT: normal Lymph: no adenopathy Neck: no JVD Endocrine:  No thryomegaly Vascular: No carotid bruits; FA pulses 2+ bilaterally without bruits  Cardiac:  normal S1, S2; RRR; 1/6 systolic murmur  Lungs:  clear to auscultation bilaterally, no wheezing, rhonchi or rales  Abd: soft, nontender, no hepatomegaly  Ext: no edema Musculoskeletal:  No deformities, BUE and BLE strength normal and equal Skin: warm and dry  Neuro:  CNs 2-12 intact, no focal abnormalities noted Psych:  Normal affect   EKG:  The EKG was personally reviewed and demonstrates: Sinus rhythm, first-degree AV block, left ventricular hypertrophy, cannot rule out prior inferior and anterior infarct. Telemetry:  Telemetry was personally reviewed and demonstrates: Sinus  Laboratory Data:  Chemistry Recent Labs  Lab 09/04/17 2028  NA 141  K 3.4*  CL 105  CO2 28  GLUCOSE 98  BUN 11  CREATININE 1.01*  CALCIUM 9.4  GFRNONAA 56*  GFRAA >60  ANIONGAP 8     Hematology Recent Labs  Lab 09/04/17 2028  WBC 5.8  RBC 4.28  HGB 12.4  HCT 37.2  MCV 86.9  MCH 29.0  MCHC 33.3  RDW 13.0  PLT 261   Cardiac Enzymes Recent Labs  Lab 09/05/17 0241 09/05/17 0745  TROPONINI <0.03 <0.03     Recent Labs  Lab 09/04/17 2040  TROPIPOC 0.00    Radiology/Studies:  Dg Chest 2 View  Result Date: 09/04/2017  CLINICAL DATA:  67 year old female with chest pressure. EXAM: CHEST - 2 VIEW COMPARISON:  Chest radiograph dated 02/27/2013 FINDINGS: The lungs are clear. There is no pleural effusion or pneumothorax. The cardiac silhouette is within normal limits. There is atherosclerotic calcification of the aortic arch. No acute osseous pathology. Right shoulder rotator cuff pins noted. IMPRESSION: No active cardiopulmonary disease. Electronically Signed   By: Anner Crete M.D.   On: 09/04/2017 21:29    Assessment and Plan:   1. Chest pain-symptoms are somewhat atypical and typically occur with stress.  Her electrocardiogram is abnormal with suggestion of prior inferior and anterior infarct.  However I have reviewed her electrocardiograms back to 2013 and these findings are old.  Previous echocardiogram showed normal wall motion.  Enzymes are negative otherwise.  We will arrange a stress nuclear study tomorrow morning for risk stratification. 2. Hypertension-blood pressure is mildly elevated.  Continue present medications and advance as needed.  Could increase amlodipine if necessary. 3. Hyperlipidemia-continue statin. 4. Murmur-likely flow murmur.  Echocardiogram is pending.   For questions or updates, please contact [place] Please consult [email or web address] for contact info under Cardiology/STEMI.   Signed, Kirk Ruths, MD  09/05/2017 2:30 PM

## 2017-09-05 NOTE — ED Provider Notes (Signed)
[facility] EMERGENCY DEPARTMENT Provider Note   CSN: [id number] Arrival date & time: 09/04/17  1949     History   Chief Complaint Chief Complaint  Patient presents with  . Chest Pain    HPI Victoria Campos is a 67 y.o. female.  The history is provided by the patient.  Chest Pain   This is a new problem. The current episode started more than 2 days ago. The problem occurs daily. The problem has been gradually worsening. The pain is present in the substernal region. The pain is moderate. The quality of the pain is described as pressure-like. The pain does not radiate. Associated symptoms include nausea. Pertinent negatives include no abdominal pain, no fever and no shortness of breath. She has tried nothing for the symptoms. Risk factors include being elderly.  Her past medical history is significant for hyperlipidemia and hypertension.  Her family medical history is significant for CAD.   Patient reports she has had episodes of chest pressure for the past week.  It comes at random, she has chest pressure and feels nauseated.  She also has had some left arm pain and numbness at times.  No fevers or vomiting.  No shortness of breath.  Previous history of PE, but this does not feel similar.  No pleuritic pain Past Medical History:  Diagnosis Date  . ANXIETY 03/10/2004   Qualifier: Diagnosis of  By: Radene Ou MD, [place]    . Heart murmur   . Hypercholesteremia   . Hypertension   . Insomnia   . PE (pulmonary embolism)   . Pulmonary embolism ([place]) 09/23/2011   Acute segmental bilateral segmental PE, Dx 09/23/11 with Pos lupus anticoagulant, neg venous dopplers, echo s R Ht strain      Patient Active Problem List   Diagnosis Date Noted  . Chest pain 09/05/2017  . Cerumen impaction 02/16/2015  . Essential hypertension 10/15/2014  . Osteoarthritis of both knees 10/15/2014  . Osteoarthritis of back 01/27/2014  . Healthcare maintenance 09/17/2013  . Myopia of left  eye 03/20/2013  . HEARING LOSS, SENSORINEURAL, BILATERAL 12/27/2009  . TINNITUS 03/14/2009  . BENIGN PAROXYSMAL POSITIONAL VERTIGO 10/21/2007  . ALLERGIC RHINITIS, SEASONAL 08/24/2004  . Hyperlipidemia 04/21/2004  . DEPRESSION 03/10/2004    Past Surgical History:  Procedure Laterality Date  . arm surgery     torn ligamint  . bunyon     removed  . tubal ligation       OB History   None      Home Medications    Prior to Admission medications   Medication Sig Start Date End Date Taking? Authorizing Provider  amLODipine (NORVASC) 5 MG tablet TAKE 1 TABLET EVERY DAY 06/21/17  Yes Rice, Resa Miner, MD  aspirin 81 MG chewable tablet Chew 81 mg by mouth daily.   Yes [provider]  atorvastatin (LIPITOR) 80 MG tablet TAKE 1 TABLET EVERY DAY 05/30/17  Yes Axel Filler, MD  fluticasone [facility]) 50 MCG/ACT nasal spray Place 2 sprays into both nostrils daily. 10/05/16  Yes Rice, Resa Miner, MD  hydrochlorothiazide (HYDRODIURIL) 25 MG tablet Take 1 tablet (25 mg total) by mouth daily. 09/30/17  Yes Rice, Resa Miner, MD  metoprolol succinate (TOPROL-XL) 50 MG 24 hr tablet TAKE 1 TABLET EVERY DAY WITH OR IMMEDIATELY FOLLOWING A MEAL. 05/28/17  Yes Bartholomew Crews, MD  ibuprofen (ADVIL,MOTRIN) 600 MG tablet TAKE 1 TABLET EVERY 6 HOURS AS NEEDED 11/17/16   Rice, Resa Miner, MD  Family History Family History  Problem Relation Age of Onset  . Cancer Brother        colon  . Cancer Sister        colon  . Cancer Sister        breast  . Heart attack Father   . Heart disease Father     Social History Social History   Tobacco Use  . Smoking status: Former Smoker    Types: Cigarettes    Last attempt to quit: 03/12/1968    Years since quitting: 49.5  . Smokeless tobacco: Never Used  Substance Use Topics  . Alcohol use: Yes    Alcohol/week: 0.6 oz    Types: 1 Glasses of wine per week    Comment: occasional  . Drug use: No     Allergies     Sulfamethoxazole-trimethoprim   Review of Systems Review of Systems  Constitutional: Negative for fever.  Respiratory: Negative for shortness of breath.   Cardiovascular: Positive for chest pain.  Gastrointestinal: Positive for nausea. Negative for abdominal pain.  Neurological: Negative for syncope.  All other systems reviewed and are negative.    Physical Exam Updated Vital Signs BP (!) 147/87   Pulse 86   Temp 98.3 F (36.8 C) (Oral)   Resp 19   SpO2 97%   Physical Exam CONSTITUTIONAL: Well developed/well nourished HEAD: Normocephalic/atraumatic EYES: EOMI/PERRL ENMT: Mucous membranes moist NECK: supple no meningeal signs SPINE/BACK:entire spine nontender CV: S1/S2 noted, soft murmur noted over right upper sternal border LUNGS: Lungs are clear to auscultation bilaterally, no apparent distress ABDOMEN: soft, nontender, no rebound or guarding, bowel sounds noted throughout abdomen GU:no cva tenderness NEURO: Pt is awake/alert/appropriate, moves all extremitiesx4.  No facial droop.   EXTREMITIES: pulses normal/equal, full ROM, no calf tenderness or edema SKIN: warm, color normal PSYCH: no abnormalities of mood noted, alert and oriented to situation   ED Treatments / Results  Labs (all labs ordered are listed, but only abnormal results are displayed) Labs Reviewed  BASIC METABOLIC PANEL - Abnormal; Notable for the following components:      Result Value   Potassium 3.4 (*)    Creatinine, Ser 1.01 (*)    GFR calc non Af Amer 56 (*)    All other components within normal limits  CBC  TROPONIN I  TROPONIN I  I-STAT TROPONIN, ED    EKG EKG Interpretation  Date/Time:  Wednesday September 04 2017 20:25:39 EDT Ventricular Rate:  82 PR Interval:  196 QRS Duration: 90 QT Interval:  372 QTC Calculation: 434 R Axis:   -7 Text Interpretation:  Normal sinus rhythm Anterior infarct , age undetermined Abnormal ECG No significant change since last tracing Confirmed by  Ripley Fraise [phone]) on 09/04/2017 11:48:46 PM   Radiology Dg Chest 2 View  Result Date: 09/04/2017 CLINICAL DATA:  67 year old female with chest pressure. EXAM: CHEST - 2 VIEW COMPARISON:  Chest radiograph dated 02/27/2013 FINDINGS: The lungs are clear. There is no pleural effusion or pneumothorax. The cardiac silhouette is within normal limits. There is atherosclerotic calcification of the aortic arch. No acute osseous pathology. Right shoulder rotator cuff pins noted. IMPRESSION: No active cardiopulmonary disease. Electronically Signed   By: Anner Crete M.D.   On: 09/04/2017 21:29    Procedures Procedures  Medications Ordered in ED Medications  potassium chloride SA (K-DUR,KLOR-CON) CR tablet 40 mEq (40 mEq Oral Given 09/05/17 0046)     Initial Impression / Assessment and Plan / ED Course  I  have reviewed the triage vital signs and the nursing notes.  Pertinent labs & imaging results that were available during my care of the patient were reviewed by me and considered in my medical decision making (see chart for details).     Patient with heart score at 5.  Will need to be admitted.  Discussed with internal medicine for admission. Low suspicion for PE at this time She is chest pain-free. She has already taken aspirin today  Final Clinical Impressions(s) / ED Diagnoses   Final diagnoses:  Chest pain, rule out acute myocardial infarction    ED Discharge Orders    None       Ripley Fraise, MD 09/05/17 [phone]

## 2017-09-05 NOTE — Progress Notes (Signed)
Chaplain provided documentation for Advance Directive to patient, she stated possible discharge. If discharged she was advised to call [facility] for appointment to have witnessed, and notarized. Chaplain Yaakov Guthrie [phone]

## 2017-09-05 NOTE — Care Management Obs Status (Signed)
[facility] NOTIFICATION   Patient Details  Name: Victoria Campos MRN: [id number] Date of Birth: May 18, 1950   Medicare Observation Status Notification Given:  Yes    Bethena Roys, RN 09/05/2017, 12:01 PM

## 2017-09-05 NOTE — Plan of Care (Signed)
  Problem: Health Behavior/Discharge Planning: Goal: Ability to manage health-related needs will improve Outcome: Progressing   

## 2017-09-05 NOTE — Progress Notes (Signed)
   Subjective: The patient was lying in her bed today upon entering the room. She continues to deny chest pain stating that the pressure improved since admission. She   Objective:  Vital signs in last 24 hours: Vitals:   09/04/17 2021 09/04/17 2218 09/04/17 2345 09/05/17 0129  BP: (!) 174/85 (!) 150/77 (!) 147/87 (!) 147/78  Pulse: 82 84 86 83  Resp: 17 16 19    Temp: 99.1 F (37.3 C) 98.3 F (36.8 C)  97.6 F (36.4 C)  TempSrc: Oral Oral  Oral  SpO2: 100% 97% 97% 97%  Weight:    181 lb 14.4 oz (82.5 kg)  Height:    5' 4.5" (1.638 m)   Physical Exam  Constitutional: She is oriented to person, place, and time.  Non-toxic appearance. No distress.  Eyes: Conjunctivae are normal.  Cardiovascular: Normal rate and regular rhythm.  Murmur heard.  Systolic murmur is present. Pulmonary/Chest: Effort normal and breath sounds normal. No respiratory distress.  Abdominal: Soft. Bowel sounds are normal. She exhibits no distension. There is no tenderness.  Musculoskeletal: She exhibits no edema or tenderness.  Neurological: She is alert and oriented to person, place, and time.  Skin: Skin is warm. Capillary refill takes less than 2 seconds. She is not diaphoretic.  Psychiatric: She has a normal mood and affect.  Vitals reviewed.  Assessment/Plan:  Principal Problem:   Chest pain Active Problems:   Hyperlipidemia   Essential hypertension  Assessment: Victoria Campos is a 67 y.o female with HTN, HLD, and h/o PE who presented to the ED with intermittent chest pressure and left arm numbness of 7 days duration. Her troponin enzymes were negative x 3, EKG was reassuring but given her heart score of 5 positive for age, risk factors and history. I feel she may warrant cardiology evaluation for consideration of inpatient vs outpatient stress testing.   Atypical Chest Pain: Patient presenting with substernal chest pain associated with nausea and left arm numbness but this pain is not associated with  exertion and primarily occurs at rest. She does have risk factors for CAD including HTN, HLD, age, BMI >30, and family history of early MI. Initial EKG was stable without ST elevation/depression. Troponin x 3 was negative.  - Heart Score 5 - Repeat EKG in AM unchanged  - Consulted cardiology as patient is moderate risk and would benefit from Coronary CT vs NM Stress test  - A1c 5.4% - Lipid panel total 146, HDL 44, LDL 89,  - Continue atorvastatin 80mg  daily - Continue ASA 81 mg Daily - Continue metoprolol sucs  Probable Aortic Stenosis. PE is consistent with AS. The patient's chest pain and lack of correlation with exertion make AS an unlikely etiology. Would classify this patient as having asymptomatic AS but would consider echocardiogram and outpatient follow-up. - Ordered Echocardiogram   HTN - Continue Amlodipine 5 mg, HCTZ 25 mg, and Metoprolol succinate 50 mg QD  HLD - Continue Atorvastatin 80 mg QD  Diet: NPO Code: Full Fluids: n/a GI PPX: n/a VTE ppx: Lovenox  Dispo: Anticipated discharge in approximately 0-1 day(s).   Kathi Ludwig, MD 09/05/2017, 7:57 AM Pager: Pager# [phone]

## 2017-09-05 NOTE — H&P (Signed)
Date: 09/05/2017               Patient Name:  Victoria Campos MRN: [id number]  DOB: [birth year] Age / Sex: 67 y.o., female   PCP: Patient, No Pcp Per         Medical Service: Internal Medicine Teaching Service         Attending Physician: Dr. Evette Doffing, Mallie Mussel, *    First Contact: Dr. Berline Lopes Pager: [phone]  Second Contact: Dr. Hetty Ely Pager: [phone]       After Hours (After 5p/  First Contact Pager: [phone]  weekends / holidays): Second Contact Pager: [phone]   Chief Complaint: Chest Pain  History of Present Illness: Victoria Campos is a 67 y.o female with HTN, HLD, and h/o PE who presented to the ED with intermittent chest pressure and left arm numbness of 7 days duration. Approximately 1 month ago the patient's grandson died and she has since been having intermittent episodes of chest pressure, nausea, and left arm numbness. She states that these episodes occur at rest and do not get worse with exercise. She has never had to stop her physical activity because of chest pressure or SOB. The episodes only last 1-2 minutes at max. She has not tried anything for the pressure. She denies diaphoresis, SOB, lightheadedness, visual changes, or palpitations during these episodes. She has not been sick recently and does not have a cough. She does have GERD but states this pain is different that her normal acid reflux. She has a history of PE and states that this does not feel like her PEs in the past.    Family history is significant for heart disease in her father (MI before the age of 58). She has never had a stress test but does have a murmur.   Meds:  Current Meds  Medication Sig  . amLODipine (NORVASC) 5 MG tablet TAKE 1 TABLET EVERY DAY  . aspirin 81 MG chewable tablet Chew 81 mg by mouth daily.  Marland Kitchen atorvastatin (LIPITOR) 80 MG tablet TAKE 1 TABLET EVERY DAY  . fluticasone (FLONASE) 50 MCG/ACT nasal spray Place 2 sprays into both nostrils daily.  Derrill Memo ON 09/30/2017]  hydrochlorothiazide (HYDRODIURIL) 25 MG tablet Take 1 tablet (25 mg total) by mouth daily.  . metoprolol succinate (TOPROL-XL) 50 MG 24 hr tablet TAKE 1 TABLET EVERY DAY WITH OR IMMEDIATELY FOLLOWING A MEAL.   Allergies: Allergies as of 09/04/2017 - Review Complete 09/04/2017  Allergen Reaction Noted  . Sulfamethoxazole-trimethoprim Rash 10/26/2008   Past Medical History:  Diagnosis Date  . ANXIETY 03/10/2004   Qualifier: Diagnosis of  By: Radene Ou MD, [place]    . Heart murmur   . Hypercholesteremia   . Hypertension   . Insomnia   . PE (pulmonary embolism)   . Pulmonary embolism ([place]) 09/23/2011   Acute segmental bilateral segmental PE, Dx 09/23/11 with Pos lupus anticoagulant, neg venous dopplers, echo s R Ht strain     Family History:  Father: + CAD Brother: + Colon cancer  Sister: + Colon and breast cancer  Social History:  Former smoker  Drinks 1 glass of wine occasionally  Denies the use of illicit substances.   Review of Systems: A complete ROS was negative except as per HPI.   Physical Exam: Blood pressure (!) 147/87, pulse 86, temperature 98.3 F (36.8 C), temperature source Oral, resp. rate 19, SpO2 97 %.  General: Well nourished female in no acute distress HENT: Normocephalic, atraumatic, moist mucus  membranes Pulm: Good air movement with no wheezing or crackles  CV: RRR, crescendo-decrescendo murmur that radiates to the carotids. Abdomen: Active bowel sounds, soft, non-distended, no tenderness to palpation  Extremities: Pulses palpable in all extremities, no LE edema  Skin: Warm and dry  Neuro: Alert and oriented x 3  EKG: personally reviewed: my interpretation is sinus rhythm with normal axis and intervals. No ST elevation, new TWI inversion, or conduction abnormalities.   CXR: personally reviewed: my interpretation is ? Stiches on the right humerus. No other bony or soft tissue abnormalities. No cardiomegaly. No pleural effusions. No opacities or  infiltrates.   Assessment & Plan by Problem: Active Problems:   Chest pain  Victoria Campos is a 67 y.o female with HTN, HLD, and h/o PE who presented to the ED with intermittent chest pressure and left arm numbness of 7 days duration.  Atypical Chest Pain. Patient presenting with substernal chest pain associated with nausea and left arm numbness but this pain is not associated with exertion and primarily occurs at rest. She does have risk factors for CAD including HTN, HLD, and family history of early MI. Initial EKG is stable without ST elevation, TWI, or new conduction abnormalities. Troponin x 1 is negative.  - Heart Score 5 - Repeat EKG in AM  - Trend troponin  - Patient is moderate risk and would benefit from Coronary CT vs NM Stress test  - Checking lipid panel and A1c  Probable Aortic Stenosis. PE is consistent with AS. The patient's chest pain and lack of correlation with exertion make AS an unlikely etiology. Would classify this patient as having asymptomatic AS but would consider echocardiogram and outpatient follow-up.   HTN - Continue Amlodipine 5 mg, HCTZ 25 mg, and Metoprolol succinate 50 mg QD  HLD - Continue Atorvastatin 80 mg QD  Diet: NPO VTE ppx: Lovenox  CODE STATUS: Full Code  Dispo: Admit patient to Observation with expected length of stay less than 2 midnights.  Signed: Ina Homes, MD 09/05/2017, 12:48 AM  My Pager: [phone]

## 2017-09-05 NOTE — Care Management Note (Signed)
Case Management Note  Patient Details  Name: Victoria Campos MRN: [id number] Date of Birth: Jan 23, 1951  Subjective/Objective:   Pt presented for Chest Pain. PTA independent from home.                  Action/Plan: No home needs identified at this time. CM will continue to monitor.   Expected Discharge Date:                  Expected Discharge Plan:  Home/Self Care  In-House Referral:  NA  Discharge planning Services  NA  Post Acute Care Choice:  NA Choice offered to:  NA  DME Arranged:  N/A DME Agency:  NA  HH Arranged:  NA HH Agency:  NA  Status of Service:  Completed, signed off  If discussed at [place] of Stay Meetings, dates discussed:    Additional Comments:  Bethena Roys, RN 09/05/2017, 12:02 PM

## 2017-09-06 ENCOUNTER — Telehealth: Payer: Self-pay | Admitting: General Practice

## 2017-09-06 ENCOUNTER — Observation Stay (HOSPITAL_BASED_OUTPATIENT_CLINIC_OR_DEPARTMENT_OTHER): Payer: Medicare HMO

## 2017-09-06 DIAGNOSIS — Z86711 Personal history of pulmonary embolism: Secondary | ICD-10-CM | POA: Diagnosis not present

## 2017-09-06 DIAGNOSIS — Z6379 Other stressful life events affecting family and household: Secondary | ICD-10-CM | POA: Diagnosis not present

## 2017-09-06 DIAGNOSIS — K219 Gastro-esophageal reflux disease without esophagitis: Secondary | ICD-10-CM | POA: Diagnosis not present

## 2017-09-06 DIAGNOSIS — E785 Hyperlipidemia, unspecified: Secondary | ICD-10-CM | POA: Diagnosis not present

## 2017-09-06 DIAGNOSIS — I5030 Unspecified diastolic (congestive) heart failure: Secondary | ICD-10-CM | POA: Diagnosis not present

## 2017-09-06 DIAGNOSIS — R079 Chest pain, unspecified: Secondary | ICD-10-CM

## 2017-09-06 DIAGNOSIS — R0789 Other chest pain: Secondary | ICD-10-CM | POA: Diagnosis not present

## 2017-09-06 DIAGNOSIS — I10 Essential (primary) hypertension: Secondary | ICD-10-CM | POA: Diagnosis not present

## 2017-09-06 DIAGNOSIS — R011 Cardiac murmur, unspecified: Secondary | ICD-10-CM | POA: Diagnosis not present

## 2017-09-06 DIAGNOSIS — Z79899 Other long term (current) drug therapy: Secondary | ICD-10-CM | POA: Diagnosis not present

## 2017-09-06 DIAGNOSIS — R072 Precordial pain: Secondary | ICD-10-CM | POA: Diagnosis not present

## 2017-09-06 DIAGNOSIS — Z882 Allergy status to sulfonamides status: Secondary | ICD-10-CM | POA: Diagnosis not present

## 2017-09-06 LAB — NM MYOCAR MULTI W/SPECT W/WALL MOTION / EF
CHL CUP RESTING HR STRESS: 59 {beats}/min
Estimated workload: 1 METS
Exercise duration (min): 4 min
Exercise duration (sec): 37 s
Peak HR: 95 {beats}/min

## 2017-09-06 LAB — ECHOCARDIOGRAM COMPLETE
HEIGHTINCHES: 64.5 in
WEIGHTICAEL: 2918.4 [oz_av]

## 2017-09-06 LAB — BASIC METABOLIC PANEL
ANION GAP: 9 (ref 5–15)
BUN: 10 mg/dL (ref 8–23)
CHLORIDE: 106 mmol/L (ref 98–111)
CO2: 24 mmol/L (ref 22–32)
Calcium: 9.4 mg/dL (ref 8.9–10.3)
Creatinine, Ser: 0.64 mg/dL (ref 0.44–1.00)
GFR calc non Af Amer: >60 mL/min (ref >60)
Glucose, Bld: 116 mg/dL — ABNORMAL HIGH (ref 70–99)
Potassium: 4 mmol/L (ref 3.5–5.1)
Sodium: 139 mmol/L (ref 135–145)

## 2017-09-06 MED ORDER — AMLODIPINE BESYLATE 10 MG PO TABS: 10.0000 mg | ORAL_TABLET | Freq: Every day | ORAL | Status: DC

## 2017-09-06 MED ORDER — REGADENOSON 0.4 MG/5ML IV SOLN
0.4000 mg | Freq: Once | INTRAVENOUS | Status: AC
  Administered 2017-09-06: 0.4 mg via INTRAVENOUS
  Filled 2017-09-06: qty 5

## 2017-09-06 MED ORDER — TECHNETIUM TC 99M TETROFOSMIN IV KIT
30.0000 | PACK | Freq: Once | INTRAVENOUS | Status: AC | PRN
  Administered 2017-09-06: 30 via INTRAVENOUS

## 2017-09-06 MED ORDER — REGADENOSON 0.4 MG/5ML IV SOLN
INTRAVENOUS | Status: AC
  Administered 2017-09-06: 12:00:00
  Filled 2017-09-06: qty 5

## 2017-09-06 MED ORDER — TECHNETIUM TC 99M TETROFOSMIN IV KIT
10.0000 | PACK | Freq: Once | INTRAVENOUS | Status: AC | PRN
  Administered 2017-09-06: 10 via INTRAVENOUS

## 2017-09-06 MED ORDER — AMLODIPINE BESYLATE 10 MG PO TABS: 10.0000 mg | ORAL_TABLET | Freq: Every day | ORAL | 1 refills | Status: DC

## 2017-09-06 NOTE — Progress Notes (Signed)
I reviewed discharge instructions with patient. She had no further questions. I discharged her in stable condition with her daughter to a private vehicle via wheelchair

## 2017-09-06 NOTE — Progress Notes (Addendum)
Progress Note  Patient Name: Victoria Campos Date of Encounter: 09/06/2017  Primary Cardiologist: [facility]- Dr. Stanford Breed   Subjective   Doing well this morning. No complaints. No CP overnight. Currently symptomatic.   Inpatient Medications    Scheduled Meds: . amLODipine  5 mg Oral Daily  . aspirin  81 mg Oral Daily  . atorvastatin  80 mg Oral Daily  . enoxaparin (LOVENOX) injection  40 mg Subcutaneous Q24H  . hydrochlorothiazide  25 mg Oral Daily  . metoprolol succinate  50 mg Oral Daily   Continuous Infusions:  PRN Meds: acetaminophen **OR** acetaminophen, fluticasone   Vital Signs    Vitals:   09/05/17 0129 09/05/17 1324 09/05/17 2037 09/06/17 0535  BP: (!) 147/78 (!) 150/73 129/71 (!) 146/81  Pulse: 83 70 73 66  Resp:   18 20  Temp: 97.6 F (36.4 C) 98.1 F (36.7 C) 98.6 F (37 C) 98.6 F (37 C)  TempSrc: Oral Oral Oral Oral  SpO2: 97% 94% 93% 93%  Weight: 181 lb 14.4 oz (82.5 kg)     Height: 5' 4.5" (1.638 m)       Intake/Output Summary (Last 24 hours) at 09/06/2017 0701 Last data filed at 09/05/2017 1934 Gross per 24 hour  Intake 350 ml  Output 300 ml  Net 50 ml   Filed Weights   09/05/17 0129  Weight: 181 lb 14.4 oz (82.5 kg)    Telemetry    NSR - Personally Reviewed  ECG    NSR 82 bpm  - Personally Reviewed  Physical Exam   GEN: No acute distress.   Neck: No JVD Cardiac: RRR, slight murmur at RUSB ? Flow murmur.  Respiratory: Clear to auscultation bilaterally. GI: Soft, nontender, non-distended  MS: No edema; No deformity. Neuro:  Nonfocal  Psych: Normal affect   Labs    Chemistry Recent Labs  Lab 09/04/17 2028  NA 141  K 3.4*  CL 105  CO2 28  GLUCOSE 98  BUN 11  CREATININE 1.01*  CALCIUM 9.4  GFRNONAA 56*  GFRAA >60  ANIONGAP 8     Hematology Recent Labs  Lab 09/04/17 2028  WBC 5.8  RBC 4.28  HGB 12.4  HCT 37.2  MCV 86.9  MCH 29.0  MCHC 33.3  RDW 13.0  PLT 261    Cardiac Enzymes Recent Labs  Lab  09/05/17 0241 09/05/17 0745  TROPONINI <0.03 <0.03    Recent Labs  Lab 09/04/17 2040  TROPIPOC 0.00     BNPNo results for input(s): BNP, PROBNP in the last 168 hours.   DDimer No results for input(s): DDIMER in the last 168 hours.   Radiology    Dg Chest 2 View  Result Date: 09/04/2017 CLINICAL DATA:  67 year old female with chest pressure. EXAM: CHEST - 2 VIEW COMPARISON:  Chest radiograph dated 02/27/2013 FINDINGS: The lungs are clear. There is no pleural effusion or pneumothorax. The cardiac silhouette is within normal limits. There is atherosclerotic calcification of the aortic arch. No acute osseous pathology. Right shoulder rotator cuff pins noted. IMPRESSION: No active cardiopulmonary disease. Electronically Signed   By: Anner Crete M.D.   On: 09/04/2017 21:29    Cardiac Studies   2D echo pending  NST pending.   Patient Profile     EVOLET SALMINEN is a 67 y.o. AA female with a hx of pulmonary embolus, hypertension, hyperlipidemia and anxiety who is being evaluated for chest pain at the request of Axel Filler MD.  Assessment & Plan    1. Chest Pain: mostly associated with stress. Symptoms somewhat atypical. EKGs dating back to 2013 suggest prior inferior and anterior infarct, however previous echocardiograms showed normal wall motion. Her cardiac enzymes are negative x 3. Cardiac risk factors include HTN and HLD. Plan is for nuclear stress test today to r/o ischemia. Further recs to be determined based on study results. She is currently pain free.   2. HTN: mildly elevated. Current regimen includes amlodipine 5, HCTZ 25 and metop suc 50. Continue to follow pressures. Can further increase amlodipine to 10 mg if necessary.   3. HLD: for now, continue statin at current dose. LDL is 89 mg/DL. If low risk stress test, then level is satisfactory. However if pt proves to have CAD, then LDL goal would be < 70 mg/dL and further adjustment to lipid lowering therapy  will need to be made.   4. Murmur: noted on exam. Echo pending to evaluate. ? Flow murmur.  For questions or updates, please contact [place] Please consult [email or web address] for contact info under Cardiology/STEMI.      Signed, Lyda Jester, PA-C  09/06/2017, 7:01 AM   As above, patient is not having recurrent chest pain.  Plan to await results of nuclear study and echocardiogram.  If negative no plans for further ischemia evaluation. Kirk Ruths, MD

## 2017-09-06 NOTE — Telephone Encounter (Signed)
Hospital f/u per Dr Berline Lopes; pt appt 07/12 115pm/NW

## 2017-09-06 NOTE — Progress Notes (Signed)
   Subjective: The patient was resting in her bed today upon entering the room. She denied chest pain/pressure since the initial event. She is in good spirits and understand the plan today is to assess her cardiac risk for MI in the near future with a cardiac stress test.   Objective:  Vital signs in last 24 hours: Vitals:   09/05/17 0129 09/05/17 1324 09/05/17 2037 09/06/17 0535  BP: (!) 147/78 (!) 150/73 129/71 (!) 146/81  Pulse: 83 70 73 66  Resp:   18 20  Temp: 97.6 F (36.4 C) 98.1 F (36.7 C) 98.6 F (37 C) 98.6 F (37 C)  TempSrc: Oral Oral Oral Oral  SpO2: 97% 94% 93% 93%  Weight: 181 lb 14.4 oz (82.5 kg)     Height: 5' 4.5" (1.638 m)      Physical Exam  Constitutional: No distress.  Eyes: Conjunctivae are normal.  Cardiovascular: Normal rate and regular rhythm.  Murmur heard.  Systolic murmur is present. Pulmonary/Chest: Effort normal and breath sounds normal. No respiratory distress.  Abdominal: Soft. Bowel sounds are normal. She exhibits no distension.  Musculoskeletal: She exhibits no edema or tenderness.  Skin: Capillary refill takes less than 2 seconds. She is not diaphoretic.  Psychiatric: She has a normal mood and affect. Her mood appears not anxious. She is not agitated.  Vitals reviewed.  Assessment/Plan:  Principal Problem:   Chest pain Active Problems:   Hyperlipidemia   Essential hypertension  Assessment: Victoria Campos is a 67 y.o female with HTN, HLD, and h/o PE who presented to the ED withintermittentchest pressure and left arm numbness of 7 days duration. Her troponin enzymes were negative x 3, EKG was reassuring but given her heart score of 5 positive for age, risk factors and history. Patient underwent assessment by cardiology who preferred inpatient evaluation with cardiac stress testing given her risk factors and history.   Atypical Chest Pain: Patient presenting with substernal chest pain associated with nausea and left arm numbness but this  pain is not associated with exertion and primarily occurs at rest. She does have risk factors for CAD including HTN, HLD, age, BMI >30, and family history of early MI. Initial EKG was stable without ST elevation/depression. Troponin x 3 was negative.  - Heart Score 5 - Consulted cardiology to perform stress test today, disposition and treatment pending read - A1c 5.4% - Lipid panel total 146, HDL 44, LDL 89,  - Continue atorvastatin 80mg  daily - Continue ASA 81 mg Daily - Continue metoprolol sucs  Probable Aortic Stenosis: PE is consistent with AS. The patient's chest pain and lack of correlation with exertion make AS an unlikely etiology. Would classify this patient as having asymptomatic AS but would consider echocardiogram and outpatient follow-up. - Ordered Echocardiogram pending  HTN: - Continue Amlodipine but increased to 10 mg daily,  - Continue HCTZ 25 mg  - Continue Metoprolol succinate 50 mg QD  HLD: - Continue Atorvastatin 80 mg QD  Diet:NPO Code: Full Fluids: n/a GI PPX: n/a VTE ppx: Lovenox  Dispo: Anticipated discharge in approximately 0-1 day(s).   Kathi Ludwig, MD 09/06/2017, 6:50 AM Pager: Pager# [phone]

## 2017-09-06 NOTE — Discharge Summary (Signed)
Name: Victoria Campos MRN: [id number] DOB: 06-25-1950 67 y.o. PCP: Patient, No Pcp Per  Date of Admission: 09/04/2017  8:16 PM Date of Discharge: 09/06/2017 Attending Physician: Axel Filler, *  Discharge Diagnosis: 1. Non cardiac chest pain 2. Aortic Stenosis  Discharge Medications: Allergies as of 09/06/2017      Reactions   Sulfamethoxazole-trimethoprim Rash   Red rashes      Medication List    TAKE these medications   amLODipine 10 MG tablet Commonly known as:  NORVASC Take 1 tablet (10 mg total) by mouth daily. Start taking on:  09/07/2017 What changed:    medication strength  how much to take   aspirin 81 MG chewable tablet Chew 81 mg by mouth daily.   atorvastatin 80 MG tablet Commonly known as:  LIPITOR TAKE 1 TABLET EVERY DAY   fluticasone 50 MCG/ACT nasal spray Commonly known as:  FLONASE Place 2 sprays into both nostrils daily. What changed:    when to take this  reasons to take this   hydrochlorothiazide 25 MG tablet Commonly known as:  HYDRODIURIL Take 1 tablet (25 mg total) by mouth daily. Start taking on:  09/30/2017   ibuprofen 600 MG tablet Commonly known as:  ADVIL,MOTRIN TAKE 1 TABLET EVERY 6 HOURS AS NEEDED What changed:    how much to take  how to take this  when to take this   metoprolol succinate 50 MG 24 hr tablet Commonly known as:  TOPROL-XL TAKE 1 TABLET EVERY DAY WITH OR IMMEDIATELY FOLLOWING A MEAL.       Disposition and follow-up:   [name] was discharged from [facility] in Stable condition.  At the hospital follow up visit please address:  1.  Noncardiac chest pressure: Likely panic attacks vs musculoskeletal chest pain given her workup. Please assess for return of symptoms and continue to evaluate the etiology  2.  Labs / imaging needed at time of follow-up: as indicated  3.  Pending labs/ test needing follow-up: Official Stress test read not delivered to patient prior to  discharge as it was not available at that time.   Follow-up Appointments:   Hospital Course by problem list: Atypical Chest Pain: Victoria Campos is a 67 y.o female with HTN, HLD, and h/o PE who presented to the ED withintermittentchest pressure and left arm numbness of 7 days duration.Her troponin enzymes were negative x 3, EKG was reassuring but given her heart score of 5 positive for age, risk factors and history. Given the presenting with substernal chest pain associated with nausea and left arm numbness this pain was not associated with exertion and primarily occurred at rest. Patient underwent assessment by cardiology who preferred inpatient evaluation with cardiac stress testing given her risk factors and history. This was read as low risk and as such she was determined to be stable for discharge home.   Aortic Stenosis:  Noted on cardio auscultation. Echo confirmed. Monitor  Discharge Vitals:   BP (!) 144/79 (BP Location: Right Arm)   Pulse (!) 58   Temp 98 F (36.7 C) (Oral)   Resp 20   Ht 5' 4.5" (1.638 m)   Wt 182 lb 6.4 oz (82.7 kg)   SpO2 94%   BMI 30.83 kg/m   Pertinent Labs, Studies, and Procedures:  CBC Latest Ref Rng & Units 09/04/2017 01/27/2014 02/27/2013  WBC 4.0 - 10.5 K/uL 5.8 3.6(L) -  Hemoglobin 12.0 - 15.0 g/dL 12.4 12.5 13.6  Hematocrit 36.0 -  46.0 % 37.2 36.3 40.0  Platelets 150 - 400 K/uL 261 235 -   CMP Latest Ref Rng & Units 09/06/2017 09/04/2017 01/18/2017  Glucose 70 - 99 mg/dL 116(H) 98 96  BUN 8 - 23 mg/dL 10 11 12   Creatinine 0.44 - 1.00 mg/dL 0.64 1.01(H) 0.80  Sodium 135 - 145 mmol/L 139 141 144  Potassium 3.5 - 5.1 mmol/L 4.0 3.4(L) 3.5  Chloride 98 - 111 mmol/L 106 105 102  CO2 22 - 32 mmol/L 24 28 27   Calcium 8.9 - 10.3 mg/dL 9.4 9.4 9.7  Total Protein 6.0 - 8.3 g/dL - - -  Total Bilirubin 0.3 - 1.2 mg/dL - - -  Alkaline Phos 39 - 117 U/L - - -  AST 0 - 37 U/L - - -  ALT 0 - 35 U/L - - -   NM Myocar Multi Stress: IMPRESSION: 1. No  reversible ischemia or infarction. 2. Normal left ventricular wall motion. 3. Left ventricular ejection fraction is 65%. 4. Non invasive risk stratification*: Low *2012 Appropriate Use Criteria for Coronary Revascularization Focused Update: J Am Coll Cardiol. 2012;59(9):857-881. Http://content.airportbarriers.com.aspx?articleid=1201161  Echo: Study Conclusions - Left ventricle: The cavity size was normal. Wall thickness was   increased in a pattern of moderate LVH. Systolic function was   normal. The estimated ejection fraction was in the range of 60%   to 65%. Wall motion was normal; there were no regional wall   motion abnormalities. Doppler parameters are consistent with   abnormal left ventricular relaxation (grade 1 diastolic   dysfunction). - Aortic valve: Mildly calcified annulus. Moderately thickened,   moderately calcified leaflets. There was trivial regurgitation. - Left atrium: The atrium was mildly dilated.  Discharge Instructions: Discharge Instructions    Call MD for:  extreme fatigue   Complete by:  As directed    Call MD for:  extreme fatigue   Complete by:  As directed    Call MD for:  persistant dizziness or light-headedness   Complete by:  As directed    Call MD for:  persistant nausea and vomiting   Complete by:  As directed    Call MD for:  persistant nausea and vomiting   Complete by:  As directed    Call MD for:  severe uncontrolled pain   Complete by:  As directed    Call MD for:  temperature >100.4   Complete by:  As directed    Diet - low sodium heart healthy   Complete by:  As directed    Diet - low sodium heart healthy   Complete by:  As directed    Increase activity slowly   Complete by:  As directed    Increase activity slowly   Complete by:  As directed       Signed: Kathi Ludwig, MD 09/06/2017, 1:32 PM   Pager: Pager# [phone]

## 2017-09-06 NOTE — Progress Notes (Signed)
  Echocardiogram 2D Echocardiogram has been performed.  [name] F 09/06/2017, 11:13 AM

## 2017-09-06 NOTE — Discharge Instructions (Signed)
Thank you for your recent visit to the [facility].  If your pain returns please contact our clinic right away and if severe or unable to reach our clinic please visit the ED.  The cardiologist feel that your testing is low risk for a cardiovascular event such as a heart attack in the next 12 months and as such we have made minimal changes to your medications today. We have increased your amlodipine to 10mg  daily. If you notice any side effects such as swelling in your legs, dizziness or lightheadedness on standing please notify us.  If you have any questions or concerns notify us 24/7 at [phone].

## 2017-09-06 NOTE — Progress Notes (Signed)
Internal Medicine Attending:   I saw and examined the patient. I reviewed the resident's note and I agree with the resident's findings and plan as documented in the resident's note.  Hospital day #2 for chest pain evaluation. ACS ruled out, she is chest pain free. [place] cardiology consultation, planning for ischemic evaluation today with stress test. If ok, can continue with primary prevention medical management and outpatient follow up.

## 2017-09-06 NOTE — Progress Notes (Signed)
Patient presented for Lexiscan. Tolerated procedure well. Pending final stress imaging result.  

## 2017-09-12 ENCOUNTER — Other Ambulatory Visit: Payer: Self-pay | Admitting: Internal Medicine

## 2017-09-12 DIAGNOSIS — I10 Essential (primary) hypertension: Secondary | ICD-10-CM

## 2017-09-20 ENCOUNTER — Ambulatory Visit: Payer: Medicare HMO

## 2017-09-23 NOTE — Telephone Encounter (Signed)
Patient rescheduled HFU for 09/25/2017. Hubbard Hartshorn, RN, BSN

## 2017-09-24 ENCOUNTER — Other Ambulatory Visit: Payer: Self-pay | Admitting: Internal Medicine

## 2017-09-24 DIAGNOSIS — I10 Essential (primary) hypertension: Secondary | ICD-10-CM

## 2017-09-25 ENCOUNTER — Encounter: Payer: Self-pay | Admitting: Internal Medicine

## 2017-09-25 ENCOUNTER — Ambulatory Visit (INDEPENDENT_AMBULATORY_CARE_PROVIDER_SITE_OTHER): Payer: Medicare HMO | Admitting: Internal Medicine

## 2017-09-25 ENCOUNTER — Other Ambulatory Visit: Payer: Self-pay

## 2017-09-25 VITALS — BP 132/69 | HR 95 | Temp 98.8°F | Ht 64.5 in | Wt 180.0 lb

## 2017-09-25 DIAGNOSIS — E785 Hyperlipidemia, unspecified: Secondary | ICD-10-CM | POA: Diagnosis not present

## 2017-09-25 DIAGNOSIS — F419 Anxiety disorder, unspecified: Secondary | ICD-10-CM

## 2017-09-25 DIAGNOSIS — Z8739 Personal history of other diseases of the musculoskeletal system and connective tissue: Secondary | ICD-10-CM

## 2017-09-25 DIAGNOSIS — I10 Essential (primary) hypertension: Secondary | ICD-10-CM

## 2017-09-25 DIAGNOSIS — Z79899 Other long term (current) drug therapy: Secondary | ICD-10-CM | POA: Diagnosis not present

## 2017-09-25 DIAGNOSIS — M25512 Pain in left shoulder: Secondary | ICD-10-CM | POA: Diagnosis not present

## 2017-09-25 MED ORDER — METOPROLOL SUCCINATE ER 50 MG PO TB24: ORAL_TABLET | ORAL | 0 refills | Status: DC

## 2017-09-25 MED ORDER — METOPROLOL SUCCINATE ER 50 MG PO TB24: 50.0000 mg | ORAL_TABLET | Freq: Every day | ORAL | 2 refills | Status: DC

## 2017-09-25 NOTE — Patient Instructions (Signed)
Mrs.Toran  You came to [place] with complaints of left shoulder pain. We think it is a rotator cuff injury due to overuse. Please increase activity slowly and keep using ibuprofen for pain control. Let us know if the pain significantly worsens and we can try some steroid injections in the joint. Thank you very much  -Gilberto Better

## 2017-09-25 NOTE — Assessment & Plan Note (Signed)
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

## 2017-09-25 NOTE — Progress Notes (Signed)
   CC: Left Shoulder Pain  HPI: [name] is a 67 y.o. F w/ PMH of Anxiety, HTN, HLD presenting to the clinic after recent ED visit for chest pressure with radiation to left shoulder on 09/04/17. She was discharged after ACS was ruled out. Since discharge she has had no more chest pressure but continuing to endorse pain of her left shoulder/arm. She describes the pain as a sore, nagging pain 7/10 isolated to the lateral aspect of her upper arm. 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 She also has a prior history of tendon repair on her right shoulder 2/2 tear after picking up heavy equipments often. She denies any fevers, chills, nausea, vomiting.  Past Medical History:  Diagnosis Date  . ANXIETY 03/10/2004   Qualifier: Diagnosis of  By: Radene Ou MD, [place]    . Heart murmur   . Hypercholesteremia   . Hypertension   . Insomnia   . Pulmonary embolism ([place]) 09/23/2011   Acute segmental bilateral segmental PE, neg dopplers, neg coag w/u     Review of Systems: Review of Systems  Constitutional: Negative for chills, fever and malaise/fatigue.  Musculoskeletal: Positive for joint pain. Negative for back pain, myalgias and neck pain.  Neurological: Negative for dizziness, tingling, sensory change and weakness.     Physical Exam: Vitals:   09/25/17 1337  BP: 132/69  Pulse: 95  Temp: 98.8 F (37.1 C)  TempSrc: Oral  SpO2: 98%  Weight: 180 lb (81.6 kg)  Height: 5' 4.5" (1.638 m)    Physical Exam  Constitutional: She is oriented to person, place, and time. She appears well-developed and well-nourished. No distress.  Cardiovascular: Normal rate, regular rhythm, normal heart sounds and intact distal pulses.  Musculoskeletal:  Left shoulder exam reveals limited motion on active abduction up to 100 degrees but intact passive range of motion. Denies pain on internal or external rotation. Pain on abduction exertion against resistance. Able to  touch contralateral upper back. No erythema, warmth, edema or effusion  Neurological: She is alert and oriented to person, place, and time. She has normal reflexes. No cranial nerve deficit. Coordination normal.  Skin: Skin is warm and dry. No rash noted. No erythema.  Surgical scar on right shoulder     Assessment & Plan:   See Encounters Tab for problem based charting.  Patient seen with Dr. Evette Doffing   -Gilberto Better, PGY1

## 2017-09-25 NOTE — Assessment & Plan Note (Addendum)
BP Readings from Last 3 Encounters:  09/25/17 132/69  09/06/17 (!) 144/79  09/04/17 (!) 166/93    - Recent ED visit for ACS rule out after complaint of 'chest pressure' - ACS work up all negative EF 65% on echo - BP at goal today - Currently on amlodipine 5mg , HCTZ 25mg , metoprolol succinate 50mg  - Patient need refill on metoprolol, have been out for the last 2 days  - Will send 30 day supply to local [facility] of patient's preference - Will send 6 month supply to her usual mail order pharmacy

## 2017-09-26 NOTE — Progress Notes (Signed)
Internal Medicine Clinic Attending  I saw and evaluated the patient.  I personally confirmed the key portions of the history and exam documented by Dr. Lee and I reviewed pertinent patient test results.  The assessment, diagnosis, and plan were formulated together and I agree with the documentation in the resident's note.  

## 2017-11-19 ENCOUNTER — Other Ambulatory Visit: Payer: Self-pay | Admitting: Internal Medicine

## 2017-11-19 DIAGNOSIS — I10 Essential (primary) hypertension: Secondary | ICD-10-CM

## 2017-11-20 NOTE — Telephone Encounter (Signed)
Next appt scheduled 10/10 with PCP. 

## 2017-12-19 ENCOUNTER — Ambulatory Visit (INDEPENDENT_AMBULATORY_CARE_PROVIDER_SITE_OTHER): Payer: Medicare HMO | Admitting: Internal Medicine

## 2017-12-19 ENCOUNTER — Other Ambulatory Visit: Payer: Self-pay

## 2017-12-19 VITALS — BP 140/63 | HR 66 | Temp 98.7°F | Ht 64.5 in | Wt 181.2 lb

## 2017-12-19 DIAGNOSIS — Z23 Encounter for immunization: Secondary | ICD-10-CM

## 2017-12-19 DIAGNOSIS — F4321 Adjustment disorder with depressed mood: Secondary | ICD-10-CM | POA: Diagnosis not present

## 2017-12-19 NOTE — Patient Instructions (Signed)
Victoria Campos, It was a pleasure meeting you, and  I look forward to serving as your primary care doctor for the next few years!   I am so sorry to hear about your grandson. I am going to ask Chilon to provide you with a list of counselors that can help you through this grieving process.   I will plan to see you back in 4 weeks to check in and see how you are doing.   Take care, and enjoy your birthday trip!

## 2017-12-20 ENCOUNTER — Encounter: Payer: Self-pay | Admitting: Internal Medicine

## 2017-12-22 ENCOUNTER — Encounter: Payer: Self-pay | Admitting: Internal Medicine

## 2017-12-22 DIAGNOSIS — F4321 Adjustment disorder with depressed mood: Secondary | ICD-10-CM | POA: Insufficient documentation

## 2017-12-22 NOTE — Assessment & Plan Note (Signed)
Spent most of visit offering support to patient as she is struggling through recent loss of her grandson. Discussed that what she is experiencing is a normal part of the grieving process. If she continues to struggle beyond normal grieving period, will consider starting SSRI. For now, have referred her to counseling services at her request. Will follow-up in 4 weeks to re-evaluate and address other chronic conditions.

## 2017-12-22 NOTE — Progress Notes (Signed)
   CC: acute grief   HPI:  [name] is a 67 y.o. female with PMHx listed below who presents today struggling with grief from the loss of her grandson a couple of months ago. She endorses support from family and friends. She is requesting to be referred to a counselor so she can talk about things to someone without a personal connection to the situation.  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   Past Medical History:  Diagnosis Date  . ANXIETY 03/10/2004   Qualifier: Diagnosis of  By: Victoria Ou MD, [place]    . Heart murmur   . Hypercholesteremia   . Hypertension   . Insomnia   . Pulmonary embolism ([place]) 09/23/2011   Acute segmental bilateral segmental PE, neg dopplers, neg coag w/u    Review of Systems:   Constitutional: negative for fevers, chills, headaches CV: negative for chest pain, palpitations, lower extremity edema  Resp: negative for cough, shortness of breath GI: negative for abdominal pain, n/v, changes in bowel habits  Physical Exam:  Vitals:   12/19/17 1503  BP: 140/63  Pulse: 66  Temp: 98.7 F (37.1 C)  TempSrc: Oral  SpO2: 99%  Weight: 181 lb 3.2 oz (82.2 kg)  Height: 5' 4.5" (1.638 m)   General: alert, appears stated age, very pleasant  CV: RRR; no murmurs, rubs or gallops Resp: normal respiratory effort; lungs CTA bilaterally Ext: no edema Psych: appropriate mood and affect; becomes tearful discussing grandson    Assessment & Plan:   See Encounters Tab for problem based charting.  Patient seen with Dr. Daryll Drown

## 2017-12-23 NOTE — Addendum Note (Signed)
Addended by: Modena Nunnery D on: 12/23/2017 08:29 AM   Modules accepted: Level of Service

## 2017-12-23 NOTE — Progress Notes (Signed)
Internal Medicine Clinic Attending  I saw and evaluated the patient.  I personally confirmed the key portions of the history and exam documented by Dr. Bloomfield and I reviewed pertinent patient test results.  The assessment, diagnosis, and plan were formulated together and I agree with the documentation in the resident's note.  

## 2017-12-27 ENCOUNTER — Other Ambulatory Visit: Payer: Self-pay | Admitting: Internal Medicine

## 2017-12-27 DIAGNOSIS — M170 Bilateral primary osteoarthritis of knee: Secondary | ICD-10-CM

## 2017-12-27 DIAGNOSIS — M47895 Other spondylosis, thoracolumbar region: Secondary | ICD-10-CM

## 2017-12-31 ENCOUNTER — Other Ambulatory Visit: Payer: Self-pay | Admitting: Internal Medicine

## 2017-12-31 DIAGNOSIS — M47895 Other spondylosis, thoracolumbar region: Secondary | ICD-10-CM

## 2017-12-31 DIAGNOSIS — M170 Bilateral primary osteoarthritis of knee: Secondary | ICD-10-CM

## 2017-12-31 NOTE — Telephone Encounter (Signed)
Needs refill on ibuprofen (ADVIL,MOTRIN) 600 MG tablet at patient wants some to be sent to [facility] then through [facility] because it takes 10days to mail it her

## 2018-01-01 MED ORDER — IBUPROFEN 600 MG PO TABS: 600.0000 mg | ORAL_TABLET | Freq: Four times a day (QID) | ORAL | 0 refills | Status: DC | PRN

## 2018-01-01 MED ORDER — IBUPROFEN 600 MG PO TABS: 600.0000 mg | ORAL_TABLET | Freq: Four times a day (QID) | ORAL | 0 refills | Status: AC | PRN

## 2018-01-11 ENCOUNTER — Encounter (HOSPITAL_COMMUNITY): Payer: Self-pay | Admitting: Neurology

## 2018-01-11 ENCOUNTER — Emergency Department (HOSPITAL_COMMUNITY)
Admission: EM | Admit: 2018-01-11 | Discharge: 2018-01-11 | Disposition: A | Discharge status: Home / Self Care | Payer: Medicare HMO | Attending: Emergency Medicine | Admitting: Emergency Medicine

## 2018-01-11 DIAGNOSIS — R040 Epistaxis: Secondary | ICD-10-CM | POA: Diagnosis not present

## 2018-01-11 DIAGNOSIS — Z87891 Personal history of nicotine dependence: Secondary | ICD-10-CM | POA: Diagnosis not present

## 2018-01-11 DIAGNOSIS — Z79899 Other long term (current) drug therapy: Secondary | ICD-10-CM | POA: Insufficient documentation

## 2018-01-11 DIAGNOSIS — R58 Hemorrhage, not elsewhere classified: Secondary | ICD-10-CM | POA: Diagnosis not present

## 2018-01-11 DIAGNOSIS — I10 Essential (primary) hypertension: Secondary | ICD-10-CM | POA: Diagnosis not present

## 2018-01-11 DIAGNOSIS — Z7982 Long term (current) use of aspirin: Secondary | ICD-10-CM | POA: Insufficient documentation

## 2018-01-11 DIAGNOSIS — R42 Dizziness and giddiness: Secondary | ICD-10-CM | POA: Diagnosis not present

## 2018-01-11 DIAGNOSIS — R000 Tachycardia, unspecified: Secondary | ICD-10-CM | POA: Diagnosis not present

## 2018-01-11 MED ORDER — OXYMETAZOLINE HCL 0.05 % NA SOLN
1.0000 | Freq: Once | NASAL | Status: AC
  Administered 2018-01-11: 1 via NASAL

## 2018-01-11 MED ORDER — OXYMETAZOLINE HCL 0.05 % NA SOLN
1.0000 | Freq: Once | NASAL | Status: DC
  Filled 2018-01-11: qty 15

## 2018-01-11 NOTE — ED Notes (Signed)
Patient verbalizes understanding of discharge instructions. Opportunity for questioning and answers were provided. Ambulatory at discharge in NAD.  

## 2018-01-11 NOTE — ED Notes (Signed)
Dr. Maryan Rued bedside attempting to help patient remove clot from nose. Patient able to blow her nose to remove clot. Denies dizziness/lightheadedness. Bleeding is controlled at this time. Pt given water to wash out her mouth. Will continue to monitor for bleeding.

## 2018-01-11 NOTE — Discharge Instructions (Signed)
Put the nasal spray in your nose once tonight on both sides and then twice a day for the next 2 days.  Also use Vaseline to keep your nose moist

## 2018-01-11 NOTE — ED Notes (Signed)
Patient states nose bleed lasted approx 1 hour last night and began again today at 9am. States bp is normally around 140 when she takes her bp meds, did not have them this am.

## 2018-01-11 NOTE — ED Triage Notes (Signed)
Pt arrived via [facility]; pt from [facility] with c/o nosebleed x 2 from last night and again this am; initial was 230/112; EMS report patient had bloody tears upon arrival; current BP 150/85; pt is three (3) BP meds; no blood thinners; 20L hand; 120 HR; 99% on RA; 20R  pt states that she feels blood running down back of throat; pt is light headed, but denies nausea or vision issues

## 2018-01-11 NOTE — ED Notes (Signed)
Patient provided ice pack to place on bridge of nose, bleeding slowed at this time with minimal amount of blood coming from nares

## 2018-01-11 NOTE — ED Notes (Signed)
Dr. Plunkett at bedside.  

## 2018-01-11 NOTE — ED Provider Notes (Signed)
[facility] DEPARTMENT Provider Note   CSN: [id number] Arrival date & time: 01/11/18  1025     History   Chief Complaint Chief Complaint  Patient presents with  . Hypertension    Nosebleed    HPI Victoria Campos is a 66 y.o. female.  Patient is a 68 year old female with a history of hypertension, prior pulmonary embolism but no longer on anticoagulation presenting today with nosebleeds.  Patient states nosebleed started last night around 7 PM.  She called EMS but they were able to get the nosebleed stopped.  This morning when she woke up she was fine but nosebleed restarted about 9:00.  She called EMS once again after trying to apply pressure at home with no improvement.  More pressure was applied and she was given ice.  Currently patient states she does not feel any ongoing bleeding at this time.  She states her nose feels clogged but she feels no blood running down the back of her throat.  She denies any shortness of breath, chest pain.  She has never had nosebleeds before she has not had any recent URI symptoms or frequent nose blowing.  She does not take any anticoagulation other than 81 mg of aspirin.  Patient was reported to be hypertensive at the scene.  She did take her blood pressure medications yesterday but not today.  The history is provided by the patient.  Epistaxis   This is a new problem. The current episode started yesterday. Episode frequency: intermittent. The problem has been resolved. The problem is associated with an unknown factor. The bleeding has been from both nares. She has tried ice and applying pressure for the symptoms. The treatment provided significant relief. Her past medical history does not include bleeding disorder, colds, sinus problems, allergies or frequent nosebleeds.    Past Medical History:  Diagnosis Date  . ANXIETY 03/10/2004   Qualifier: Diagnosis of  By: Radene Ou MD, [place]    . Heart murmur   .  Hypercholesteremia   . Hypertension   . Insomnia   . Pulmonary embolism ([place]) 09/23/2011   Acute segmental bilateral segmental PE, neg dopplers, neg coag w/u     Patient Active Problem List   Diagnosis Date Noted  . Grief 12/22/2017  . Chest pain 09/05/2017  . Cerumen impaction 02/16/2015  . Essential hypertension 10/15/2014  . Osteoarthritis of both knees 10/15/2014  . Osteoarthritis of back 01/27/2014  . Healthcare maintenance 09/17/2013  . Myopia of left eye 03/20/2013  . HEARING LOSS, SENSORINEURAL, BILATERAL 12/27/2009  . TINNITUS 03/14/2009  . Left shoulder pain 04/20/2008  . BENIGN PAROXYSMAL POSITIONAL VERTIGO 10/21/2007  . ALLERGIC RHINITIS, SEASONAL 08/24/2004  . Hyperlipidemia 04/21/2004  . DEPRESSION 03/10/2004    Past Surgical History:  Procedure Laterality Date  . arm surgery     torn ligamint  . bunyon     removed  . tubal ligation       OB History   None      Home Medications    Prior to Admission medications   Medication Sig Start Date End Date Taking? Authorizing Provider  amLODipine (NORVASC) 10 MG tablet Take 1 tablet (10 mg total) by mouth daily. 09/07/17   Kathi Ludwig, MD  aspirin 81 MG chewable tablet Chew 81 mg by mouth daily.    [provider]  atorvastatin (LIPITOR) 80 MG tablet TAKE 1 TABLET EVERY DAY 05/30/17   Axel Filler, MD  fluticasone [facility]) 50 MCG/ACT nasal spray  Place 2 sprays into both nostrils daily. Patient taking differently: Place 2 sprays into both nostrils daily as needed for allergies or rhinitis.  10/05/16   Collier Salina, MD  hydrochlorothiazide (HYDRODIURIL) 25 MG tablet TAKE 1 TABLET EVERY DAY 11/20/17   Aldine Contes, MD  ibuprofen (ADVIL,MOTRIN) 600 MG tablet Take 1 tablet (600 mg total) by mouth every 6 (six) hours as needed. 01/01/18   Bloomfield, Carley D, DO  ibuprofen (ADVIL,MOTRIN) 600 MG tablet Take 1 tablet (600 mg total) by mouth every 6 (six) hours as needed for up to  10 days. 01/01/18 01/11/18  Bloomfield, Carley D, DO  metoprolol succinate (TOPROL-XL) 50 MG 24 hr tablet TAKE 1 TABLET EVERY DAY WITH OR IMMEDIATELY FOLLOWING A MEAL. 09/25/17   Mosetta Anis, MD  metoprolol succinate (TOPROL-XL) 50 MG 24 hr tablet Take 1 tablet (50 mg total) by mouth daily. Take with or immediately following a meal. 10/26/17   Mosetta Anis, MD    Family History Family History  Problem Relation Age of Onset  . Cancer Brother        colon  . Cancer Sister        colon  . Cancer Sister        breast  . Heart attack Father        died at age 78  . Heart disease Father     Social History Social History   Tobacco Use  . Smoking status: Former Smoker    Types: Cigarettes    Last attempt to quit: 03/12/1968    Years since quitting: 49.8  . Smokeless tobacco: Never Used  Substance Use Topics  . Alcohol use: Yes    Alcohol/week: 1.0 standard drinks    Types: 1 Glasses of wine per week    Comment: occasional  . Drug use: No     Allergies   Sulfamethoxazole-trimethoprim   Review of Systems Review of Systems  HENT: Positive for nosebleeds.   All other systems reviewed and are negative.    Physical Exam Updated Vital Signs BP (!) 161/73 (BP Location: Left Arm)   Pulse (!) 104   Temp 98.3 F (36.8 C) (Oral)   Resp 20   SpO2 100%   Physical Exam  Constitutional: She is oriented to person, place, and time. She appears well-developed and well-nourished. No distress.  HENT:  Head: Normocephalic.  Nose: Epistaxis is observed.  Fresh clot noted in bilateral nares  Eyes: Pupils are equal, round, and reactive to light.  Cardiovascular: Normal rate, regular rhythm, normal heart sounds and intact distal pulses.  Pulmonary/Chest: Effort normal and breath sounds normal.  Musculoskeletal: She exhibits no edema or tenderness.  Neurological: She is alert and oriented to person, place, and time.  Skin: Skin is warm and dry.  Psychiatric: She has a normal mood and  affect. Her behavior is normal.     ED Treatments / Results  Labs (all labs ordered are listed, but only abnormal results are displayed) Labs Reviewed - No data to display  EKG None  Radiology No results found.  Procedures Procedures (including critical care time)  Medications Ordered in ED Medications - No data to display   Initial Impression / Assessment and Plan / ED Course  I have reviewed the triage vital signs and the nursing notes.  Pertinent labs & imaging results that were available during my care of the patient were reviewed by me and considered in my medical decision making (see chart for details).  Patient presenting today with epistaxis.  Currently no bleeding and fresh clot present in both naris.  Unclear if it is anterior posterior at this time.  Patient does take aspirin but no other anticoagulation.  Patient was extremely hypertensive at the scene but currently is 161/73.  12:22 PM Clot was evacuated and Afrin was placed in bilateral nares.  Patient has been monitored and she has had no further bleeding.  Patient will be sent home with Afrin and given return precautions  Final Clinical Impressions(s) / ED Diagnoses   Final diagnoses:  Acute anterior epistaxis    ED Discharge Orders    None       Blanchie Dessert, MD 01/11/18 1222

## 2018-01-17 ENCOUNTER — Other Ambulatory Visit: Payer: Self-pay

## 2018-01-17 ENCOUNTER — Encounter: Payer: Self-pay | Admitting: Internal Medicine

## 2018-01-17 ENCOUNTER — Ambulatory Visit (INDEPENDENT_AMBULATORY_CARE_PROVIDER_SITE_OTHER): Payer: 59 | Admitting: Internal Medicine

## 2018-01-17 VITALS — BP 125/63 | HR 65 | Temp 98.7°F | Ht 64.5 in | Wt 182.2 lb

## 2018-01-17 DIAGNOSIS — Z86711 Personal history of pulmonary embolism: Secondary | ICD-10-CM

## 2018-01-17 DIAGNOSIS — R011 Cardiac murmur, unspecified: Secondary | ICD-10-CM | POA: Diagnosis not present

## 2018-01-17 DIAGNOSIS — F4321 Adjustment disorder with depressed mood: Secondary | ICD-10-CM

## 2018-01-17 DIAGNOSIS — Z7982 Long term (current) use of aspirin: Secondary | ICD-10-CM | POA: Diagnosis not present

## 2018-01-17 DIAGNOSIS — Z87891 Personal history of nicotine dependence: Secondary | ICD-10-CM

## 2018-01-17 DIAGNOSIS — R040 Epistaxis: Secondary | ICD-10-CM | POA: Diagnosis not present

## 2018-01-17 MED ORDER — OXYMETAZOLINE HCL 0.05 % NA SOLN: 1.0000 | Freq: Two times a day (BID) | NASAL | 0 refills | Status: DC | PRN

## 2018-01-17 NOTE — Progress Notes (Signed)
CC: Nose bleeding and grief  HPI: Victoria Campos is a 67 y.o.  with a PMH listed below presenting for follow up of her epistaxis and grief.   Patient has had 3 episodes of epistaxis since last Friday. The initial episode started in the evening on Friday and lasted for about 1.5 hours. She called EMS that night however patient was able to get the nosebleed to stop and she was told to come to the ER if it restarted.  On Saturday she developed a nosebleed and went to the emergency room, she was able to stop the the bleeding with compression.  In the ER where the clot was removed and she was given Afrin.  Discharged home with Afrin and advised to apply Vaseline around the nose. She had another episode of epistaxis on Tuesday night that lasted for about 1.5 hours. She denies any trauma to the area, picking at nose, dryness, runny nose, sinus pressure. Denies any other areas of bleeding. She has not been using her fluticasone. She does report that she started using her heater last week but denies any excessive dryness. 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 She has a history of pulmonary embolism and had been on anticoagulation in the past however she is now only taking aspirin.   Please see A&P for status of the patient's chronic medical conditions  Past Medical History:  Diagnosis Date  . ANXIETY 03/10/2004   Qualifier: Diagnosis of  By: Radene Ou MD, [place]    . Heart murmur   . Hypercholesteremia   . Hypertension   . Insomnia   . Pulmonary embolism ([place]) 09/23/2011   Acute segmental bilateral segmental PE, neg dopplers, neg coag w/u    Review of Systems: Refer to history of present illness and assessment and plans for pertinent review of systems, all others reviewed and negative.  Physical Exam:  Vitals:   01/17/18 0941  BP: 125/63  Pulse: 65  Temp: 98.7 F (37.1 C)  TempSrc: Oral  SpO2: 98%  Weight: 182 lb 3.2 oz (82.6 kg)    Height: 5' 4.5" (1.638 m)    Physical Exam  Constitutional: She is oriented to person, place, and time and well-developed, well-nourished, and in no distress.  HENT:  Head: Normocephalic and atraumatic.  Nares: no epistaxis or clots seen, no ulcers or lesions, no erythema. No nasal polyps.   Eyes: Pupils are equal, round, and reactive to light. Conjunctivae and EOM are normal.  Neck: Normal range of motion. Neck supple. No thyromegaly present.  Cardiovascular: Normal rate and regular rhythm. Exam reveals no gallop and no friction rub.  Murmur (systolic) heard. Pulmonary/Chest: Effort normal and breath sounds normal. No respiratory distress. She has no wheezes.  Abdominal: Soft. Bowel sounds are normal. She exhibits no distension.  Musculoskeletal: Normal range of motion.  Neurological: She is alert and oriented to person, place, and time. Gait normal.  Skin: Skin is warm and dry. No erythema.  Psychiatric: Mood and affect normal.    Social History   Socioeconomic History  . Marital status: Single    Spouse name: Not on file  . Number of children: Not on file  . Years of education: Not on file  . Highest education level: Not on file  Occupational History  . Not on file  Social Needs  . Financial resource strain: Not on file  . Food insecurity:    Worry: Not on file  Inability: Not on file  . Transportation needs:    Medical: Not on file    Non-medical: Not on file  Tobacco Use  . Smoking status: Former Smoker    Types: Cigarettes    Last attempt to quit: 03/12/1968    Years since quitting: 49.8  . Smokeless tobacco: Never Used  Substance and Sexual Activity  . Alcohol use: Yes    Alcohol/week: 1.0 standard drinks    Types: 1 Glasses of wine per week    Comment: occasional  . Drug use: No  . Sexual activity: Not on file  Lifestyle  . Physical activity:    Days per week: Not on file    Minutes per session: Not on file  . Stress: Not on file  Relationships  .  Social connections:    Talks on phone: Not on file    Gets together: Not on file    Attends religious service: Not on file    Active member of club or organization: Not on file    Attends meetings of clubs or organizations: Not on file    Relationship status: Not on file  . Intimate partner violence:    Fear of current or ex partner: Not on file    Emotionally abused: Not on file    Physically abused: Not on file    Forced sexual activity: Not on file  Other Topics Concern  . Not on file  Social History Narrative  . Not on file    Family History  Problem Relation Age of Onset  . Cancer Brother        colon  . Cancer Sister        colon  . Cancer Sister        breast  . Heart attack Father        died at age 59  . Heart disease Father     Assessment & Plan:   See Encounters Tab for problem based charting.  Patient seen with Dr. Angelia Mould

## 2018-01-17 NOTE — Patient Instructions (Addendum)
Thank you for allowing [place] to provide your care today. Today we discussed your nose bleeds. This may be related to the dryness, stress, and blood pressure.  Today we made some changes to your medications.   Please STOP using aspirin You can use the Afrin when you start getting a nose bleed Please continue to apply Vaseline to the nasal mucosa to keep the area moisturized  Make sure to sleep in a humidified environment  If you start having a nose bleed and it continues despite the Afrin you can return to clinic where we can cauterize the blood vessels at that time.   We have made a referral to the psychologist here.   Please follow-up as needed.   Should you have any questions or concerns please call the internal medicine clinic at [phone].     Nosebleed, Adult A nosebleed is when blood comes out of the nose. Nosebleeds are common. Usually, they are not a sign of a serious condition. Nosebleeds can happen if a small blood vessel in your nose starts to bleed or if the lining of your nose (mucous membrane) cracks. They are commonly caused by:  Allergies.  Colds.  Picking your nose.  Blowing your nose too hard.  An injury from sticking an object into your nose or getting hit in the nose.  Dry or cold air.  Less common causes of nosebleeds include:  Toxic fumes.  Something abnormal in the nose or in the air-filled spaces in the bones of the face (sinuses).  Growths in the nose, such as polyps.  Medicines or conditions that cause blood to clot slowly.  Certain illnesses or procedures that irritate or dry out the nasal passages.  Follow these instructions at home: When you have a nosebleed:  Sit down and tilt your head slightly forward.  Use a clean towel or tissue to pinch your nostrils under the bony part of your nose. After 10 minutes, let go of your nose and see if bleeding starts again. Do not release pressure before that time. If there is still bleeding, repeat the  pinching and holding for 10 minutes until the bleeding stops.  Do not place tissues or gauze in the nose to stop bleeding.  Avoid lying down and avoid tilting your head backward. That may make blood collect in the throat and cause gagging or coughing.  Use a nasal spray decongestant to help with a nosebleed as told by your health care provider.  Do not use petroleum jelly or mineral oil in your nose. It can drip into your lungs. After a nosebleed:  Avoid blowing your nose or sniffing for a number of hours.  Avoid straining, lifting, or bending at the waist for several days. You may resume other normal activities as you are able.  Use saline spray or a humidifier as told by your health care provider.  Aspirinand blood thinners make bleeding more likely. If you are prescribed these medicines and you suffer from nosebleeds: ? Ask your health care provider if you should stop taking the medicines or if you should adjust the dose. ? Do not stop taking medicines that your health care provider has recommended unless told by your health care provider.  If your nosebleed was caused by dry mucous membranes, use over-the-counter saline nasal spray or gel. This will keep the mucous membranes moist and allow them to heal. If you must use a lubricant: ? Choose one that is water-soluble. ? Use only as much as you need  and use it only as often as needed. ? Do not lie down until several hours after you use it. Contact a health care provider if:  You have a fever.  You get nosebleeds often or more often than usual.  You bruise very easily.  You have a nosebleed from having something stuck in your nose.  You have bleeding in your mouth.  You vomit or cough up brown material.  You have a nosebleed after you start a new medicine. Get help right away if:  You have a nosebleed after a fall or a head injury.  Your nosebleed does not go away after 20 minutes.  You feel dizzy or weak.  You have  unusual bleeding from other parts of your body.  You have unusual bruising on other parts of your body.  You become sweaty.  You vomit blood. This information is not intended to replace advice given to you by your health care provider. Make sure you discuss any questions you have with your health care provider. Document Released: 12/06/2004 Document Revised: 10/27/2015 Document Reviewed: 09/13/2015 Elsevier Interactive Patient Education  [employer].

## 2018-01-17 NOTE — Assessment & Plan Note (Signed)
Assessment: Patient has had 3 episodes of epistaxis since last Friday. The initial episode started in the evening on Friday and lasted for about 1.5 hours. She called EMS that night however patient was able to get the nosebleed to stop and she was told to come to the ER if it restarted.  On Saturday she developed a nosebleed and went to the emergency room, she was able to stop the the bleeding with compression.  In the ER where the clot was removed and she was given Afrin.  Discharged home with Afrin and advised to apply Vaseline around the nose. She had another episode of epistaxis on Tuesday night that lasted for about 1.5 hours. She denies any trauma to the area, picking at nose, dryness, runny nose, sinus pressure. Denies any other areas of bleeding. She has not been using her fluticasone. She does report that she started using her heater last week but denies any excessive dryness. 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 She has a history of pulmonary embolism and had been on anticoagulation in the past however she is now only taking aspirin.   On exam she is hemodynamically stable, well appearing, no blood clots or active bleeding in the nares, no nasal polyps, no ulcers, and no lesions seen.   Assessment: It appears that this is an uncomplicated epistaxis, likely an anterior bleed.  She does report that she recently started using her heater which many contributed to a dryer environment.  She is anticoagulation, only taking aspirin, denies any other sources of bleeding so it does not appear to be a systemic cause.  She has not had any trauma or other types of irritation.  She is currently not having any nosebleeding and there are no signs of any polyps or abnormalities on exam.  We will treat her symptomatically for now and advised her if she continues to have nosebleeds that do not improve with symptomatic management to return to be evaluated for  cauterization.  -Prescription for Afrin, advised her to take with nosebleeds occur -CBC  -Provided patient with information on nosebleeds

## 2018-01-17 NOTE — Assessment & Plan Note (Signed)
Assessment: Patient had a recent loss recurrence on and reports to be very depressed.  She was referred to counseling service on her last visit last month however she has not been able to get in contact with them.  Informed her that we have been have behavioral health service available here and offer this service, she reports that she would like to see someone here.  Plan: -Referral for integrative behavioral health

## 2018-01-18 LAB — CBC
Hematocrit: 33.8 % — ABNORMAL LOW (ref 34.0–46.6)
Hemoglobin: 11.5 g/dL (ref 11.1–15.9)
MCH: 29 pg (ref 26.6–33.0)
MCHC: 34 g/dL (ref 31.5–35.7)
MCV: 85 fL (ref 79–97)
Platelets: 275 10*3/uL (ref 150–450)
RBC: 3.96 x10E6/uL (ref 3.77–5.28)
RDW: 12.6 % (ref 12.3–15.4)
WBC: 4.1 10*3/uL (ref 3.4–10.8)

## 2018-01-20 NOTE — Progress Notes (Signed)
Internal Medicine Clinic Attending  I saw and evaluated the patient.  I personally confirmed the key portions of the history and exam documented by Dr. Sherry Ruffing and I reviewed pertinent patient test results.  The assessment, diagnosis, and plan were formulated together and I agree with the documentation in the resident's note.    Recurrent nose bleeds in setting of recent use of home heater.  I reviewed with her that she does take aspirin 81mg  daily, she takes this for Hx of heart murmur, she has never had a MI, TIA or CVA, I discussed in this case that aspirin is for primary ppx and thus can safely be discontinued if she is having nose bleeds.

## 2018-01-23 ENCOUNTER — Ambulatory Visit: Payer: 59 | Admitting: Licensed Clinical Social Worker

## 2018-02-17 NOTE — Addendum Note (Signed)
Addended by: Hulan Fray on: 02/17/2018 05:42 PM   Modules accepted: Orders

## 2018-03-03 ENCOUNTER — Encounter: Payer: Self-pay | Admitting: Licensed Clinical Social Worker

## 2018-03-03 ENCOUNTER — Telehealth: Payer: Self-pay | Admitting: Licensed Clinical Social Worker

## 2018-03-03 NOTE — Telephone Encounter (Signed)
Patient was contacted to follow up on a referral. Patient did not answer, and her voicemail box was full.

## 2018-03-19 ENCOUNTER — Other Ambulatory Visit: Payer: Self-pay | Admitting: Student in an Organized Health Care Education/Training Program

## 2018-03-19 DIAGNOSIS — I10 Essential (primary) hypertension: Secondary | ICD-10-CM

## 2018-06-10 ENCOUNTER — Other Ambulatory Visit: Payer: Self-pay | Admitting: *Deleted

## 2018-06-10 MED ORDER — AMLODIPINE BESYLATE 10 MG PO TABS: 10.0000 mg | ORAL_TABLET | Freq: Every day | ORAL | 1 refills | Status: DC

## 2018-06-12 ENCOUNTER — Other Ambulatory Visit: Payer: Self-pay | Admitting: Internal Medicine

## 2018-06-12 ENCOUNTER — Other Ambulatory Visit: Payer: Self-pay | Admitting: *Deleted

## 2018-06-12 DIAGNOSIS — I10 Essential (primary) hypertension: Secondary | ICD-10-CM

## 2018-06-13 ENCOUNTER — Other Ambulatory Visit: Payer: Self-pay | Admitting: Internal Medicine

## 2018-06-13 MED ORDER — HYDROCHLOROTHIAZIDE 25 MG PO TABS: 25.0000 mg | ORAL_TABLET | Freq: Every day | ORAL | 0 refills | Status: DC

## 2018-06-13 NOTE — Telephone Encounter (Signed)
Refill Request  hydrochlorothiazide (HYDRODIURIL) 25 MG tablet  [facility] - [place], [place] - [address]   The patient also wants to use the same pharmacy below instead of the [facility]  metoprolol succinate (TOPROL-XL) 50 MG 24 hr tablet  [place], [place]

## 2018-06-13 NOTE — Telephone Encounter (Signed)
done

## 2018-09-29 ENCOUNTER — Other Ambulatory Visit: Payer: Self-pay

## 2018-09-29 ENCOUNTER — Ambulatory Visit (INDEPENDENT_AMBULATORY_CARE_PROVIDER_SITE_OTHER): Payer: Medicare HMO | Admitting: Internal Medicine

## 2018-09-29 ENCOUNTER — Encounter: Payer: Self-pay | Admitting: Internal Medicine

## 2018-09-29 VITALS — BP 123/66 | HR 97 | Temp 99.1°F | Ht 64.5 in | Wt 183.2 lb

## 2018-09-29 DIAGNOSIS — Z79899 Other long term (current) drug therapy: Secondary | ICD-10-CM | POA: Diagnosis not present

## 2018-09-29 DIAGNOSIS — R600 Localized edema: Secondary | ICD-10-CM | POA: Diagnosis not present

## 2018-09-29 DIAGNOSIS — I10 Essential (primary) hypertension: Secondary | ICD-10-CM | POA: Diagnosis not present

## 2018-09-29 DIAGNOSIS — I5032 Chronic diastolic (congestive) heart failure: Secondary | ICD-10-CM

## 2018-09-29 DIAGNOSIS — R011 Cardiac murmur, unspecified: Secondary | ICD-10-CM | POA: Diagnosis not present

## 2018-09-29 DIAGNOSIS — Z1231 Encounter for screening mammogram for malignant neoplasm of breast: Secondary | ICD-10-CM

## 2018-09-29 MED ORDER — FUROSEMIDE 20 MG PO TABS: 20.0000 mg | ORAL_TABLET | Freq: Every day | ORAL | 11 refills | Status: DC

## 2018-09-29 NOTE — Patient Instructions (Addendum)
Victoria Campos, It was as pleasure seeing you.   Today we discussed your swelling in the legs.  I am getting some labs done and will let you know if there are any abnormalities. I'd also like to get a repeat ultrasound of your heart in the next few weeks. Please start taking lasix 20 mg daily to help get fluid off. In addition, I'd recommend compression stockings to wear during the day and elevated your legs at night and whenever you can throughout the day.    Your blood pressure looks great today!    We'll see you back in 2-3 months to see how you are doing.   Take care, Dr. Koleen Distance

## 2018-09-29 NOTE — Progress Notes (Signed)
CMA successfully irrigated both ears with peroxide/water solution. Pt tolerated procedure well. CMA able to visualize both eardrums after irrigation. Regenia Skeeter, Darlene Cassady7/20/20202:36 PM

## 2018-09-29 NOTE — Assessment & Plan Note (Signed)
Patient presents with 4 month history of bilateral lower extremity edema that improves with elevation. Calves are fairly symmetric on exam with only 1 cm difference in circumference. Therefore overall suspicion for DVT is low. She had an echo 1 year ago which showed grade 1 diastolic dysfunction. No symptoms indicated of cardiac ischemia, but will obtain repeat echo to rule out new HFrEF.  - CMP and U/A today.   - Will manage as HFpEF with likely chronic venous insufficiency. Start Lasix 20 mg.  - Educated on compression stockings throughout the day and to continue elevated legs whenever possible during the day and at night

## 2018-09-29 NOTE — Assessment & Plan Note (Addendum)
Patient is due for mammogram screening. Order placed today.

## 2018-09-29 NOTE — Assessment & Plan Note (Signed)
Blood pressure at goal today on current therapy. Continue Amlodipine 10 mg, HCTZ 25 mg, Metoprolol 50 mg. If lower extremity swelling continues to be problematic, may need to consider decreasing or discontinuing amlodipine.

## 2018-09-29 NOTE — Progress Notes (Signed)
   CC: HTN, lower extremity swelling  HPI:  [name] is a 68 y.o. female with PMHx listed below who presents for follow-up of HTN and lower extremity swelling for the last 4 months. She denies any leg pain, skin changes, or one leg appearing larger than the other. Her symptoms improve when she elevates her legs. Denies chest pain or dyspnea with exertion, orthopnea, abdominal swelling, changes in urination.   Past Medical History:  Diagnosis Date  . ANXIETY 03/10/2004   Qualifier: Diagnosis of  By: Radene Ou MD, [place]    . Heart murmur   . Hypercholesteremia   . Hypertension   . Insomnia   . Pulmonary embolism ([place]) 09/23/2011   Acute segmental bilateral segmental PE, neg dopplers, neg coag w/u    Review of Systems:  Review of Systems  Constitutional: Negative for chills and fever.  Respiratory: Negative for cough and shortness of breath.   Cardiovascular: Negative for chest pain, palpitations, orthopnea and PND.  Gastrointestinal: Negative for abdominal pain, constipation and diarrhea.  Musculoskeletal: Negative for back pain and falls.  Neurological: Negative for weakness and headaches.     Physical Exam:  Vitals:   09/29/18 1327  BP: 123/66  Pulse: 97  Temp: 99.1 F (37.3 C)  TempSrc: Oral  SpO2: 100%  Weight: 183 lb 3.2 oz (83.1 kg)  Height: 5' 4.5" (1.638 m)   General: alert, pleasant female, appears stated age, NAD HEENT: conjunctiva normal Neck: supple; no thyromegaly or cervical LAD CV: RRR; SEM at RUSB Pulm: normal work of breathing; lungs CTAB Abd: BS+; abdomen is soft, non-distended, non-tender Ext: R calf circumference 42 cm, left calf 41. 1+ pitting edema to knees bilaterally. No pain, increased warm or erythema Psych: pleasant mood and affect   Assessment & Plan:   See Encounters Tab for problem based charting.  Patient discussed with Dr. Evette Doffing

## 2018-09-29 NOTE — Progress Notes (Deleted)
   CC: ***  HPI:  [name] is a 68 y.o.   Past Medical History:  Diagnosis Date  . ANXIETY 03/10/2004   Qualifier: Diagnosis of  By: Radene Ou MD, [place]    . Heart murmur   . Hypercholesteremia   . Hypertension   . Insomnia   . Pulmonary embolism ([place]) 09/23/2011   Acute segmental bilateral segmental PE, neg dopplers, neg coag w/u    Review of Systems:  ***  Physical Exam:  There were no vitals filed for this visit. ***  Assessment & Plan:   See Encounters Tab for problem based charting.  Patient {GC/GE:[id number]::"discussed with","seen with"} Dr. {NAMES:[id number]::"Butcher","Granfortuna","E. Hoffman","Mullen","Narendra","Raines","Vincent"}

## 2018-09-30 LAB — URINALYSIS, COMPLETE
Bilirubin, UA: NEGATIVE
Glucose, UA: NEGATIVE
Ketones, UA: NEGATIVE
Nitrite, UA: NEGATIVE
Protein,UA: NEGATIVE
RBC, UA: NEGATIVE
Specific Gravity, UA: 1.016 (ref 1.005–1.030)
Urobilinogen, Ur: 1 mg/dL (ref 0.2–1.0)
pH, UA: 6.5 (ref 5.0–7.5)

## 2018-09-30 LAB — CMP14 + ANION GAP
ALT: 18 IU/L (ref 0–32)
AST: 15 IU/L (ref 0–40)
Albumin/Globulin Ratio: 1.8 (ref 1.2–2.2)
Albumin: 4.6 g/dL (ref 3.8–4.8)
Alkaline Phosphatase: 98 IU/L (ref 39–117)
Anion Gap: 15 mmol/L (ref 10.0–18.0)
BUN/Creatinine Ratio: 18 (ref 12–28)
BUN: 14 mg/dL (ref 8–27)
Bilirubin Total: 0.8 mg/dL (ref 0.0–1.2)
CO2: 25 mmol/L (ref 20–29)
Calcium: 9.6 mg/dL (ref 8.7–10.3)
Chloride: 104 mmol/L (ref 96–106)
Creatinine, Ser: 0.79 mg/dL (ref 0.57–1.00)
GFR calc Af Amer: 88 mL/min/{1.73_m2} (ref >59)
GFR calc non Af Amer: 77 mL/min/{1.73_m2} (ref >59)
Globulin, Total: 2.6 g/dL (ref 1.5–4.5)
Glucose: 100 mg/dL — ABNORMAL HIGH (ref 65–99)
Potassium: 3.7 mmol/L (ref 3.5–5.2)
Sodium: 144 mmol/L (ref 134–144)
Total Protein: 7.2 g/dL (ref 6.0–8.5)

## 2018-09-30 LAB — MICROSCOPIC EXAMINATION
Bacteria, UA: NONE SEEN
Casts: NONE SEEN /lpf
Epithelial Cells (non renal): >10 /hpf — AB (ref 0–10)

## 2018-09-30 NOTE — Progress Notes (Signed)
Internal Medicine Clinic Attending  Case discussed with Dr. Bloomfield at the time of the visit.  We reviewed the resident's history and exam and pertinent patient test results.  I agree with the assessment, diagnosis, and plan of care documented in the resident's note.  

## 2018-10-13 ENCOUNTER — Other Ambulatory Visit (HOSPITAL_COMMUNITY): Payer: Medicare HMO

## 2018-10-24 ENCOUNTER — Ambulatory Visit (HOSPITAL_COMMUNITY)
Admission: RE | Admit: 2018-10-24 | Discharge: 2018-10-24 | Disposition: A | Discharge status: Home / Self Care | Payer: Medicare HMO | Source: Ambulatory Visit | Attending: Student in an Organized Health Care Education/Training Program | Admitting: Student in an Organized Health Care Education/Training Program

## 2018-10-24 ENCOUNTER — Other Ambulatory Visit: Payer: Self-pay

## 2018-10-24 ENCOUNTER — Other Ambulatory Visit (HOSPITAL_COMMUNITY): Payer: Medicare HMO

## 2018-10-24 DIAGNOSIS — I5032 Chronic diastolic (congestive) heart failure: Secondary | ICD-10-CM | POA: Diagnosis not present

## 2018-10-24 DIAGNOSIS — I110 Hypertensive heart disease with heart failure: Secondary | ICD-10-CM | POA: Diagnosis not present

## 2018-10-24 DIAGNOSIS — E785 Hyperlipidemia, unspecified: Secondary | ICD-10-CM | POA: Diagnosis not present

## 2018-10-24 NOTE — Progress Notes (Signed)
  Echocardiogram 2D Echocardiogram has been performed.  Bobbye Charleston 10/24/2018, 10:31 AM

## 2018-12-05 ENCOUNTER — Ambulatory Visit: Payer: Medicare HMO

## 2018-12-29 ENCOUNTER — Encounter: Payer: Medicare HMO | Admitting: Internal Medicine

## 2019-01-01 ENCOUNTER — Other Ambulatory Visit: Payer: Self-pay

## 2019-01-01 ENCOUNTER — Other Ambulatory Visit: Payer: Self-pay | Admitting: Internal Medicine

## 2019-01-01 DIAGNOSIS — I10 Essential (primary) hypertension: Secondary | ICD-10-CM

## 2019-01-01 MED ORDER — HYDROCHLOROTHIAZIDE 25 MG PO TABS: 25.0000 mg | ORAL_TABLET | Freq: Every day | ORAL | 1 refills | Status: DC

## 2019-01-01 MED ORDER — AMLODIPINE BESYLATE 10 MG PO TABS: 10.0000 mg | ORAL_TABLET | Freq: Every day | ORAL | 1 refills | Status: DC

## 2019-01-01 MED ORDER — METOPROLOL SUCCINATE ER 50 MG PO TB24: 50.0000 mg | ORAL_TABLET | Freq: Every day | ORAL | 1 refills | Status: DC

## 2019-01-01 NOTE — Telephone Encounter (Signed)
amLODipine (NORVASC) 10 MG tablet   hydrochlorothiazide (HYDRODIURIL) 25 MG tablet  metoprolol succinate (TOPROL-XL) 50 MG 24 hr tablet, REFILL REQUEST @ [facility] ON [address].

## 2019-01-06 ENCOUNTER — Telehealth: Payer: Self-pay | Admitting: *Deleted

## 2019-01-06 MED ORDER — HYDROCHLOROTHIAZIDE 25 MG PO TABS: 25.0000 mg | ORAL_TABLET | Freq: Every day | ORAL | 0 refills | Status: DC

## 2019-01-06 MED ORDER — METOPROLOL SUCCINATE ER 50 MG PO TB24: 50.0000 mg | ORAL_TABLET | Freq: Every day | ORAL | 0 refills | Status: DC

## 2019-01-06 MED ORDER — AMLODIPINE BESYLATE 10 MG PO TABS: 10.0000 mg | ORAL_TABLET | Freq: Every day | ORAL | 0 refills | Status: DC

## 2019-01-06 NOTE — Telephone Encounter (Signed)
Pt calls and states she is out completely of metoprolol, hctz and amlodipine, it will be 10 days before she gets her shipment from [facility], could we send 7 days worth to [facility] at [address]?

## 2019-01-06 NOTE — Telephone Encounter (Signed)
Scripts for 7 days sent to [facility].   Thanks!

## 2019-01-12 ENCOUNTER — Ambulatory Visit (INDEPENDENT_AMBULATORY_CARE_PROVIDER_SITE_OTHER): Payer: Medicare HMO | Admitting: Internal Medicine

## 2019-01-12 ENCOUNTER — Other Ambulatory Visit: Payer: Self-pay

## 2019-01-12 ENCOUNTER — Encounter: Payer: Self-pay | Admitting: Internal Medicine

## 2019-01-12 DIAGNOSIS — Z23 Encounter for immunization: Secondary | ICD-10-CM

## 2019-01-12 DIAGNOSIS — E785 Hyperlipidemia, unspecified: Secondary | ICD-10-CM | POA: Diagnosis not present

## 2019-01-12 DIAGNOSIS — R600 Localized edema: Secondary | ICD-10-CM

## 2019-01-12 DIAGNOSIS — I10 Essential (primary) hypertension: Secondary | ICD-10-CM | POA: Diagnosis not present

## 2019-01-12 DIAGNOSIS — Z79899 Other long term (current) drug therapy: Secondary | ICD-10-CM

## 2019-01-12 DIAGNOSIS — E7800 Pure hypercholesterolemia, unspecified: Secondary | ICD-10-CM

## 2019-01-12 DIAGNOSIS — Z0000 Encounter for general adult medical examination without abnormal findings: Secondary | ICD-10-CM

## 2019-01-12 NOTE — Patient Instructions (Addendum)
Ms. Macnamara, It was great seeing you! Glad things are going well, and happy belated 68th birthday!   Your work-up for the leg swelling was all normal which is great news. You can continue the low dose of Lasix and continue elevate your legs as needed.   We will not make any medication changes today.   I will be sure to follow-up your mammogram results in a couple of weeks.   Please feel free to call sooner, but otherwise I don't need to see you until after the new year.   Take care,  Dr. Koleen Distance

## 2019-01-18 ENCOUNTER — Encounter: Payer: Self-pay | Admitting: Internal Medicine

## 2019-01-18 MED ORDER — ATORVASTATIN CALCIUM 80 MG PO TABS: 80.0000 mg | ORAL_TABLET | Freq: Every day | ORAL | 3 refills | Status: DC

## 2019-01-18 NOTE — Assessment & Plan Note (Addendum)
Patient is due for mammogram which she is scheduled for in a couple of weeks, but is otherwise UTD on health maintenance screenings.   Influenza vaccine administered at today's visit.

## 2019-01-18 NOTE — Assessment & Plan Note (Signed)
Improved since initiating Lasix 20 mg daily and elevated her legs whenever possible. Recent echo unchanged from prior exam which showed preserved EF and mild diastolic dysfunction. CMP and U/A without evidence of hepatic or renal etiologies.

## 2019-01-18 NOTE — Assessment & Plan Note (Signed)
This problem is chronic and stable. Will refill Lipitor 80 mg daily.

## 2019-01-18 NOTE — Progress Notes (Signed)
   CC: HTN, lower extremity edema   HPI:  [name] is a 68 y.o. female with PMHx listed below who presents for follow-up on HTN and lower extremity edema. Please see problem based charting for further details.   Past Medical History:  Diagnosis Date  . ANXIETY 03/10/2004   Qualifier: Diagnosis of  By: Radene Ou MD, [place]    . Heart murmur   . Hypercholesteremia   . Hypertension   . Insomnia   . Pulmonary embolism ([place]) 09/23/2011   Acute segmental bilateral segmental PE, neg dopplers, neg coag w/u    Review of Systems:  Review of Systems  Constitutional: Negative for chills, fever and weight loss.  HENT: Negative for hearing loss.   Eyes: Negative for blurred vision.  Respiratory: Negative for cough and shortness of breath.   Cardiovascular: Negative for chest pain, palpitations and PND.  Gastrointestinal: Negative for abdominal pain, constipation, diarrhea, nausea and vomiting.  Genitourinary: Negative for dysuria and urgency.  Musculoskeletal: Negative for falls.  Neurological: Negative for tingling, sensory change, focal weakness and headaches.  Psychiatric/Behavioral: Negative for depression.    Physical Exam:  Vitals:   01/12/19 1602  BP: 130/68  Pulse: 63  Temp: 98.4 F (36.9 C)  TempSrc: Oral  SpO2: 98%  Weight: 177 lb 9.6 oz (80.6 kg)   Physical Exam Constitutional:      General: She is not in acute distress.    Appearance: Normal appearance.  HENT:     Mouth/Throat:     Mouth: Mucous membranes are moist.  Eyes:     Extraocular Movements: Extraocular movements intact.     Conjunctiva/sclera: Conjunctivae normal.  Neck:     Musculoskeletal: Normal range of motion and neck supple.  Cardiovascular:     Rate and Rhythm: Normal rate and regular rhythm.  Pulmonary:     Effort: Pulmonary effort is normal.     Breath sounds: Normal breath sounds.  Abdominal:     General: Bowel sounds are normal. There is no distension.     Palpations: Abdomen is  soft.     Tenderness: There is no abdominal tenderness.  Musculoskeletal: Normal range of motion.        General: No swelling.  Neurological:     General: No focal deficit present.     Mental Status: She is alert and oriented to person, place, and time.  Psychiatric:        Mood and Affect: Mood normal.        Behavior: Behavior normal.     Assessment & Plan:   See Encounters Tab for problem based charting.  Patient discussed with Dr. Philipp Ovens

## 2019-01-18 NOTE — Assessment & Plan Note (Signed)
Ms. Nemeroff has good blood pressure control on current regimen of Amlodipine, HCTZ and Metoprolol. No symptoms concerning for orthostatic hypotension. Renal function on most recent labs is stable. Will follow-up in 6 months.

## 2019-01-19 ENCOUNTER — Other Ambulatory Visit: Payer: Self-pay | Admitting: Internal Medicine

## 2019-01-19 DIAGNOSIS — M47895 Other spondylosis, thoracolumbar region: Secondary | ICD-10-CM

## 2019-01-19 DIAGNOSIS — M170 Bilateral primary osteoarthritis of knee: Secondary | ICD-10-CM

## 2019-01-19 NOTE — Progress Notes (Signed)
Internal Medicine Clinic Attending  Case discussed with Dr. Bloomfield at the time of the visit.  We reviewed the resident's history and exam and pertinent patient test results.  I agree with the assessment, diagnosis, and plan of care documented in the resident's note.  

## 2019-01-22 ENCOUNTER — Ambulatory Visit: Payer: Medicare HMO

## 2019-01-26 DIAGNOSIS — Z20828 Contact with and (suspected) exposure to other viral communicable diseases: Secondary | ICD-10-CM | POA: Diagnosis not present

## 2019-01-26 DIAGNOSIS — Z1159 Encounter for screening for other viral diseases: Secondary | ICD-10-CM | POA: Diagnosis not present

## 2019-03-11 ENCOUNTER — Ambulatory Visit: Payer: Medicare HMO

## 2019-03-16 ENCOUNTER — Other Ambulatory Visit: Payer: Self-pay

## 2019-03-16 ENCOUNTER — Encounter: Payer: Self-pay | Admitting: Internal Medicine

## 2019-03-16 ENCOUNTER — Ambulatory Visit: Payer: Medicare HMO | Admitting: Internal Medicine

## 2019-03-16 VITALS — BP 134/65 | HR 68 | Wt 176.5 lb

## 2019-03-16 DIAGNOSIS — I10 Essential (primary) hypertension: Secondary | ICD-10-CM

## 2019-03-16 NOTE — Patient Instructions (Signed)
Ms. Victoria Campos, It was great seeing you! Glad to hear you had a wonderful holiday and are staying safe! I appreciate you coming in for the [facility] paperwork, I will fill this out and have it ready before the end of this week.   Take care, and I'll see you for your scheduled visit in May! Dr. Koleen Distance

## 2019-03-16 NOTE — Progress Notes (Signed)
   CC: DMV paperwork   HPI:  [name] is a 69 y.o. 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 She has no other acute concerns today. Recently seen by me for complete physical in November.    Past Medical History:  Diagnosis Date  . ANXIETY 03/10/2004   Qualifier: Diagnosis of  By: Victoria Ou MD, Victoria Campos    . Heart murmur   . Hypercholesteremia   . Hypertension   . Insomnia   . Pulmonary embolism ([place]) 09/23/2011   Acute segmental bilateral segmental PE, neg dopplers, neg coag w/u     Physical Exam:  Vitals:   03/16/19 1444  BP: 134/65  Pulse: 68  SpO2: 99%  Weight: 176 lb 8 oz (80.1 kg)   Exam deferred.   Assessment & Plan:   See Encounters Tab for problem based charting.  Patient discussed with Dr. Evette Campos

## 2019-03-17 NOTE — Progress Notes (Signed)
I discussed the case with Dr. Koleen Distance.

## 2019-04-15 ENCOUNTER — Telehealth: Payer: Self-pay | Admitting: Internal Medicine

## 2019-04-15 NOTE — Telephone Encounter (Signed)
Pt wants to know if she can takes the covid vaccine [phone]

## 2019-04-15 NOTE — Telephone Encounter (Signed)
Dr bloomfield please advise pt on this, thanks

## 2019-04-17 NOTE — Telephone Encounter (Signed)
Pt returned call to RN, she states she has never had any type of allergic reaction to a vaccine in the past.  Pt informed it is safe and recommended for her to receive the Covid vaccine.  Pt states she has an appt to get the vaccine tomorrow. SChaplin, RN,BSN

## 2019-04-17 NOTE — Telephone Encounter (Signed)
RTC, no answer, VM obtained and HIPPA compliant message left to call RN back. SChaplin, RN,BSN

## 2019-04-17 NOTE — Telephone Encounter (Signed)
Pls contact pt [phone]; pt is calling back

## 2019-04-20 ENCOUNTER — Ambulatory Visit: Payer: Medicare HMO

## 2019-05-26 ENCOUNTER — Ambulatory Visit: Payer: Medicare HMO

## 2019-06-01 ENCOUNTER — Ambulatory Visit (INDEPENDENT_AMBULATORY_CARE_PROVIDER_SITE_OTHER): Payer: Medicare HMO | Admitting: *Deleted

## 2019-06-01 DIAGNOSIS — Z111 Encounter for screening for respiratory tuberculosis: Secondary | ICD-10-CM | POA: Diagnosis not present

## 2019-06-04 LAB — TB SKIN TEST
Induration: 0 mm
TB Skin Test: NEGATIVE

## 2019-06-23 ENCOUNTER — Other Ambulatory Visit: Payer: Self-pay | Admitting: Internal Medicine

## 2019-06-23 ENCOUNTER — Other Ambulatory Visit: Payer: Self-pay

## 2019-06-23 ENCOUNTER — Ambulatory Visit (INDEPENDENT_AMBULATORY_CARE_PROVIDER_SITE_OTHER): Payer: Medicare HMO | Admitting: Internal Medicine

## 2019-06-23 ENCOUNTER — Encounter: Payer: Self-pay | Admitting: Internal Medicine

## 2019-06-23 DIAGNOSIS — M170 Bilateral primary osteoarthritis of knee: Secondary | ICD-10-CM

## 2019-06-23 DIAGNOSIS — M47895 Other spondylosis, thoracolumbar region: Secondary | ICD-10-CM

## 2019-06-23 MED ORDER — MELOXICAM 15 MG PO TABS: 15.0000 mg | ORAL_TABLET | Freq: Every day | ORAL | 0 refills | Status: AC

## 2019-06-23 MED ORDER — DICLOFENAC SODIUM 1 % EX GEL: 4.0000 g | Freq: Four times a day (QID) | CUTANEOUS | 1 refills | Status: DC

## 2019-06-23 NOTE — Patient Instructions (Signed)
Ms. Magstadt, It was nice seeing you as always! Sorry to hear about your knee pain. I am sending in a week's worth of a different anti-inflammatory to take once a day for the next week. You will take this instead of the ibuprofen. Continue elevating and using heat as needed.  I have included a work note for you through next Monday.   To reschedule your mammogram, please call [phone].   Take care! Dr. Koleen Distance

## 2019-06-24 ENCOUNTER — Encounter: Payer: Self-pay | Admitting: Internal Medicine

## 2019-06-24 NOTE — Progress Notes (Signed)
Acute Office Visit  Subjective:    Patient ID: Victoria Campos, female    DOB: 1950-11-22, 69 y.o.   MRN: [id number]  Chief Complaint  Patient presents with  . Knee Pain    left knee  . Medication Refill    pain med     HPI Patient is in today for left knee pain. Please see problem based charting for further details.   Past Medical History:  Diagnosis Date  . ANXIETY 03/10/2004   Qualifier: Diagnosis of  By: Radene Ou MD, [place]    . Heart murmur   . Hypercholesteremia   . Hypertension   . Insomnia   . Pulmonary embolism ([place]) 09/23/2011   Acute segmental bilateral segmental PE, neg dopplers, neg coag w/u     Past Surgical History:  Procedure Laterality Date  . arm surgery     torn ligamint  . bunyon     removed  . tubal ligation      Family History  Problem Relation Age of Onset  . Cancer Brother        colon  . Cancer Sister        colon  . Cancer Sister        breast  . Heart attack Father        died at age 33  . Heart disease Father     Social History   Socioeconomic History  . Marital status: Single    Spouse name: Not on file  . Number of children: Not on file  . Years of education: Not on file  . Highest education level: Not on file  Occupational History  . Not on file  Tobacco Use  . Smoking status: Former Smoker    Types: Cigarettes    Quit date: 03/12/1968    Years since quitting: 51.3  . Smokeless tobacco: Never Used  Substance and Sexual Activity  . Alcohol use: Yes    Alcohol/week: 1.0 standard drinks    Types: 1 Glasses of wine per week    Comment: occasional  . Drug use: No  . Sexual activity: Not on file  Other Topics Concern  . Not on file  Social History Narrative  . Not on file   Social Determinants of Health   Financial Resource Strain:   . Difficulty of Paying Living Expenses:   Food Insecurity:   . Worried About Charity fundraiser in the Last Year:   . Arboriculturist in the Last Year:   Transportation Needs:    . Film/video editor (Medical):   Marland Kitchen Lack of Transportation (Non-Medical):   Physical Activity:   . Days of Exercise per Week:   . Minutes of Exercise per Session:   Stress:   . Feeling of Stress :   Social Connections:   . Frequency of Communication with Friends and Family:   . Frequency of Social Gatherings with Friends and Family:   . Attends Religious Services:   . Active Member of Clubs or Organizations:   . Attends Archivist Meetings:   Marland Kitchen Marital Status:   Intimate Partner Violence:   . Fear of Current or Ex-Partner:   . Emotionally Abused:   Marland Kitchen Physically Abused:   . Sexually Abused:     Outpatient Medications Prior to Visit  Medication Sig Dispense Refill  . amLODipine (NORVASC) 10 MG tablet Take 1 tablet (10 mg total) by mouth daily. 90 tablet 1  . aspirin 81 MG chewable  tablet Chew 81 mg by mouth daily.    Marland Kitchen atorvastatin (LIPITOR) 80 MG tablet Take 1 tablet (80 mg total) by mouth daily. 90 tablet 3  . fluticasone (FLONASE) 50 MCG/ACT nasal spray Place 2 sprays into both nostrils daily. (Patient taking differently: Place 2 sprays into both nostrils daily as needed for allergies or rhinitis. ) 16 g 2  . furosemide (LASIX) 20 MG tablet Take 1 tablet (20 mg total) by mouth daily. 30 tablet 11  . hydrochlorothiazide (HYDRODIURIL) 25 MG tablet Take 1 tablet (25 mg total) by mouth daily. 90 tablet 1  . IBU 600 MG tablet TAKE 1 TABLET EVERY 6 HOURS AS NEEDED 90 tablet 0  . metoprolol succinate (TOPROL-XL) 50 MG 24 hr tablet Take 1 tablet (50 mg total) by mouth daily. Take with or immediately following a meal. 90 tablet 1  . oxymetazoline (AFRIN NASAL SPRAY) 0.05 % nasal spray Place 1 spray into both nostrils 2 (two) times daily as needed (Nose bleeds). 30 mL 0   No facility-administered medications prior to visit.    Allergies  Allergen Reactions  . Sulfamethoxazole-Trimethoprim Rash    Red rashes    Review of Systems  Constitutional: Positive for activity  change.  Musculoskeletal: Positive for gait problem and joint swelling. Negative for back pain and myalgias.  Neurological: Negative for weakness and numbness.       Objective:    Physical Exam Constitutional:      General: She is not in acute distress.    Appearance: Normal appearance.  Musculoskeletal:     Left knee: Swelling and crepitus present. No effusion, erythema or ecchymosis. Decreased range of motion. Tenderness present over the medial joint line and lateral joint line. No LCL laxity, MCL laxity, ACL laxity or PCL laxity. Neurological:     Mental Status: She is alert.     BP 112/76 (BP Location: Left Arm, Patient Position: Sitting, Cuff Size: Normal)   Pulse 71   Temp 98.1 F (36.7 C) (Oral)   Ht 5' 4.5" (1.638 m)   Wt 174 lb 4.8 oz (79.1 kg)   SpO2 99%   BMI 29.46 kg/m  Wt Readings from Last 3 Encounters:  06/23/19 174 lb 4.8 oz (79.1 kg)  03/16/19 176 lb 8 oz (80.1 kg)  01/12/19 177 lb 9.6 oz (80.6 kg)    Health Maintenance Due  Topic Date Due  . Hepatitis C Screening  Never done  . MAMMOGRAM  09/22/2017    There are no preventive care reminders to display for this patient.   Lab Results  Component Value Date   TSH 1.099 08/02/2014   Lab Results  Component Value Date   WBC 4.1 01/17/2018   HGB 11.5 01/17/2018   HCT 33.8 (L) 01/17/2018   MCV 85 01/17/2018   PLT 275 01/17/2018   Lab Results  Component Value Date   NA 144 09/29/2018   K 3.7 09/29/2018   CO2 25 09/29/2018   GLUCOSE 100 (H) 09/29/2018   BUN 14 09/29/2018   CREATININE 0.79 09/29/2018   BILITOT 0.8 09/29/2018   ALKPHOS 98 09/29/2018   AST 15 09/29/2018   ALT 18 09/29/2018   PROT 7.2 09/29/2018   ALBUMIN 4.6 09/29/2018   CALCIUM 9.6 09/29/2018   ANIONGAP 9 09/06/2017   Lab Results  Component Value Date   CHOL 146 09/05/2017   Lab Results  Component Value Date   HDL 44 09/05/2017   Lab Results  Component Value Date   LDLCALC  89 09/05/2017   Lab Results    Component Value Date   TRIG 67 09/05/2017   Lab Results  Component Value Date   CHOLHDL 3.3 09/05/2017   Lab Results  Component Value Date   HGBA1C 5.4 09/05/2017       Assessment & Plan:   Problem List Items Addressed This Visit    None       Meds ordered this encounter  Medications  . meloxicam (MOBIC) 15 MG tablet    Sig: Take 1 tablet (15 mg total) by mouth daily for 7 days.    Dispense:  7 tablet    Refill:  0  . diclofenac Sodium (VOLTAREN) 1 % GEL    Sig: Apply 4 g topically 4 (four) times daily.    Dispense:  150 g    Refill:  1     Thi Sisemore D Mckaylee Dimalanta, DO

## 2019-06-24 NOTE — Progress Notes (Signed)
Internal Medicine Clinic Attending  Case discussed with Dr. Bloomfield at the time of the visit.  We reviewed the resident's history and exam and pertinent patient test results.  I agree with the assessment, diagnosis, and plan of care documented in the resident's note.  Audrick Lamoureaux, M.D., Ph.D.  

## 2019-06-24 NOTE — Assessment & Plan Note (Signed)
Patient presents with acute flare-up of left knee pain after standing from low-seated position a week ago. 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 She has been using prescription-strength Ibuprofen as needed and elevating the knee when she gets home.  Steroid injection offered today, but she was not interested. Will try a week of Meloxicam, as well as staying home from work the rest of the week to see if this helps with the acute inflammation.

## 2019-07-15 ENCOUNTER — Ambulatory Visit (INDEPENDENT_AMBULATORY_CARE_PROVIDER_SITE_OTHER): Payer: Medicare HMO | Admitting: Internal Medicine

## 2019-07-15 ENCOUNTER — Encounter: Payer: Self-pay | Admitting: Internal Medicine

## 2019-07-15 DIAGNOSIS — M170 Bilateral primary osteoarthritis of knee: Secondary | ICD-10-CM

## 2019-07-15 DIAGNOSIS — I10 Essential (primary) hypertension: Secondary | ICD-10-CM

## 2019-07-15 NOTE — Assessment & Plan Note (Signed)
Patient recently evaluated for flare up of knee pain. She was prescribed voltaren gel, in addition to continue Ibuprofen as needed. Pain is much improved with topical NSAID. Denies any further issues.

## 2019-07-15 NOTE — Assessment & Plan Note (Signed)
This problem is chronic and stable. Denies issues with orthostatic hypotension, headaches, vision changes, chest pain, or shortness of breath. Will continue current regimen.

## 2019-07-15 NOTE — Progress Notes (Signed)
  [facility] Internal Medicine Residency Telephone Encounter Continuity Care Appointment  HPI:   This telephone encounter was created for Ms. Victoria Campos on 07/15/2019 for the following purpose/cc chronic HTN, knee pain.   Past Medical History:  Past Medical History:  Diagnosis Date  . ANXIETY 03/10/2004   Qualifier: Diagnosis of  By: Radene Ou MD, [place]    . Heart murmur   . Hypercholesteremia   . Hypertension   . Insomnia   . Pulmonary embolism ([place]) 09/23/2011   Acute segmental bilateral segmental PE, neg dopplers, neg coag w/u      ROS:   No headaches, light headedness with standing, chest pain.    Assessment / Plan / Recommendations:   Please see A&P under problem oriented charting for assessment of the patient's acute and chronic medical conditions.   As always, pt is advised that if symptoms worsen or new symptoms arise, they should go to an urgent care facility or to to ER for further evaluation.   Consent and Medical Decision Making:   Patient discussed with Dr. Dareen Piano  This is a telephone encounter between Victoria Campos and Delice Bison on 07/15/2019 for chronic HTN, OA. The visit was conducted with the patient located at home and Delice Bison at [facility]. The patient's identity was confirmed using their DOB and current address. The patient has consented to being evaluated through a telephone encounter and understands the associated risks (an examination cannot be done and the patient may need to come in for an appointment) / benefits (allows the patient to remain at home, decreasing exposure to coronavirus). I personally spent 15 minutes on medical discussion.

## 2019-07-16 NOTE — Progress Notes (Signed)
Internal Medicine Clinic Attending  Case discussed with Dr. Bloomfield at the time of the visit.  We reviewed the resident's history and exam and pertinent patient test results.  I agree with the assessment, diagnosis, and plan of care documented in the resident's note.  

## 2019-07-20 ENCOUNTER — Other Ambulatory Visit: Payer: Self-pay | Admitting: Internal Medicine

## 2019-07-20 DIAGNOSIS — Z1231 Encounter for screening mammogram for malignant neoplasm of breast: Secondary | ICD-10-CM

## 2019-07-28 ENCOUNTER — Other Ambulatory Visit: Payer: Self-pay

## 2019-07-28 ENCOUNTER — Ambulatory Visit
Admission: RE | Admit: 2019-07-28 | Discharge: 2019-07-28 | Disposition: A | Discharge status: Home / Self Care | Payer: Medicare HMO | Source: Ambulatory Visit | Attending: Student in an Organized Health Care Education/Training Program | Admitting: Student in an Organized Health Care Education/Training Program

## 2019-07-28 DIAGNOSIS — Z1231 Encounter for screening mammogram for malignant neoplasm of breast: Secondary | ICD-10-CM

## 2019-08-20 ENCOUNTER — Other Ambulatory Visit: Payer: Self-pay | Admitting: *Deleted

## 2019-08-20 DIAGNOSIS — I10 Essential (primary) hypertension: Secondary | ICD-10-CM

## 2019-08-23 MED ORDER — AMLODIPINE BESYLATE 10 MG PO TABS: 10.0000 mg | ORAL_TABLET | Freq: Every day | ORAL | 1 refills | Status: DC

## 2019-08-23 MED ORDER — HYDROCHLOROTHIAZIDE 25 MG PO TABS: 25.0000 mg | ORAL_TABLET | Freq: Every day | ORAL | 1 refills | Status: DC

## 2019-08-23 MED ORDER — METOPROLOL SUCCINATE ER 50 MG PO TB24: 50.0000 mg | ORAL_TABLET | Freq: Every day | ORAL | 1 refills | Status: DC

## 2019-09-21 ENCOUNTER — Other Ambulatory Visit: Payer: Self-pay | Admitting: Internal Medicine

## 2019-09-21 DIAGNOSIS — M47895 Other spondylosis, thoracolumbar region: Secondary | ICD-10-CM

## 2019-09-21 DIAGNOSIS — M170 Bilateral primary osteoarthritis of knee: Secondary | ICD-10-CM

## 2019-09-23 ENCOUNTER — Ambulatory Visit (INDEPENDENT_AMBULATORY_CARE_PROVIDER_SITE_OTHER): Payer: Medicare HMO | Admitting: Internal Medicine

## 2019-09-23 ENCOUNTER — Encounter: Payer: Self-pay | Admitting: Internal Medicine

## 2019-09-23 ENCOUNTER — Other Ambulatory Visit: Payer: Self-pay

## 2019-09-23 DIAGNOSIS — H6123 Impacted cerumen, bilateral: Secondary | ICD-10-CM | POA: Diagnosis not present

## 2019-09-23 NOTE — Patient Instructions (Addendum)
Victoria Campos, It was nice seeing you!  Today we discussed your decreased hearing which is due to ear wax blocking your canal. You should notice immediate improvement once this is removed.   Please let us know if you continue have difficulty afterwards.   Take care, Dr. Gwenlyn Saran Buildup, Adult The ears produce a substance called earwax that helps keep bacteria out of the ear and protects the skin in the ear canal. Occasionally, earwax can build up in the ear and cause discomfort or hearing loss. What increases the risk? This condition is more likely to develop in people who:  Are female.  Are elderly.  Naturally produce more earwax.  Clean their ears often with cotton swabs.  Use earplugs often.  Use in-ear headphones often.  Wear hearing aids.  Have narrow ear canals.  Have earwax that is overly thick or sticky.  Have eczema.  Are dehydrated.  Have excess hair in the ear canal. What are the signs or symptoms? Symptoms of this condition include:  Reduced or muffled hearing.  A feeling of fullness in the ear or feeling that the ear is plugged.  Fluid coming from the ear.  Ear pain.  Ear itch.  Ringing in the ear.  Coughing.  An obvious piece of earwax that can be seen inside the ear canal. How is this diagnosed? This condition may be diagnosed based on:  Your symptoms.  Your medical history.  An ear exam. During the exam, your health care provider will look into your ear with an instrument called an otoscope. You may have tests, including a hearing test. How is this treated? This condition may be treated by:  Using ear drops to soften the earwax.  Having the earwax removed by a health care provider. The health care provider may: ? Flush the ear with water. ? Use an instrument that has a loop on the end (curette). ? Use a suction device.  Surgery to remove the wax buildup. This may be done in severe cases. Follow these instructions at  home:   Take over-the-counter and prescription medicines only as told by your health care provider.  Do not put any objects, including cotton swabs, into your ear. You can clean the opening of your ear canal with a washcloth or facial tissue.  Follow instructions from your health care provider about cleaning your ears. Do not over-clean your ears.  Drink enough fluid to keep your urine clear or pale yellow. This will help to thin the earwax.  Keep all follow-up visits as told by your health care provider. If earwax builds up in your ears often or if you use hearing aids, consider seeing your health care provider for routine, preventive ear cleanings. Ask your health care provider how often you should schedule your cleanings.  If you have hearing aids, clean them according to instructions from the manufacturer and your health care provider. Contact a health care provider if:  You have ear pain.  You develop a fever.  You have blood, pus, or other fluid coming from your ear.  You have hearing loss.  You have ringing in your ears that does not go away.  Your symptoms do not improve with treatment.  You feel like the room is spinning (vertigo). Summary  Earwax can build up in the ear and cause discomfort or hearing loss.  The most common symptoms of this condition include reduced or muffled hearing and a feeling of fullness in the ear or feeling that  the ear is plugged.  This condition may be diagnosed based on your symptoms, your medical history, and an ear exam.  This condition may be treated by using ear drops to soften the earwax or by having the earwax removed by a health care provider.  Do not put any objects, including cotton swabs, into your ear. You can clean the opening of your ear canal with a washcloth or facial tissue. This information is not intended to replace advice given to you by your health care provider. Make sure you discuss any questions you have with your  health care provider. Document Revised: 02/08/2017 Document Reviewed: 05/09/2016 Elsevier Patient Education  2020 [employer].

## 2019-09-25 ENCOUNTER — Encounter: Payer: Self-pay | Admitting: Internal Medicine

## 2019-09-25 NOTE — Assessment & Plan Note (Signed)
Patient presents with 2 weeks of progressive hearing loss, particularly in the left ear. She has tried home irrigation without benefit. She states she normally requires irrigation at the clinic about once a year.   Irrigation of ears today resulted in immediate improvement in hearing. On repeat exam I am able to visualize both TMs without residual cerumen of inflammation.

## 2019-09-25 NOTE — Progress Notes (Signed)
Acute Office Visit  Subjective:    Patient ID: Victoria Campos, female    DOB: 15-Jan-1951, 69 y.o.   MRN: [id number]  Chief Complaint  Patient presents with  . Otalgia    no drainage     HPI Patient is in today for decreased hearing in left ear. Please see problem based charting for further details.   Past Medical History:  Diagnosis Date  . ANXIETY 03/10/2004   Qualifier: Diagnosis of  By: Radene Ou MD, [place]    . Heart murmur   . Hypercholesteremia   . Hypertension   . Insomnia   . Pulmonary embolism ([place]) 09/23/2011   Acute segmental bilateral segmental PE, neg dopplers, neg coag w/u     Past Surgical History:  Procedure Laterality Date  . arm surgery     torn ligamint  . bunyon     removed  . tubal ligation      Family History  Problem Relation Age of Onset  . Cancer Brother        colon  . Cancer Sister        colon  . Cancer Sister        breast  . Heart attack Father        died at age 89  . Heart disease Father     Social History   Socioeconomic History  . Marital status: Single    Spouse name: Not on file  . Number of children: Not on file  . Years of education: Not on file  . Highest education level: Not on file  Occupational History  . Not on file  Tobacco Use  . Smoking status: Former Smoker    Types: Cigarettes    Quit date: 03/12/1968    Years since quitting: 51.5  . Smokeless tobacco: Never Used  Substance and Sexual Activity  . Alcohol use: Yes    Alcohol/week: 1.0 standard drink    Types: 1 Glasses of wine per week    Comment: occasional  . Drug use: No  . Sexual activity: Not on file  Other Topics Concern  . Not on file  Social History Narrative  . Not on file   Social Determinants of Health   Financial Resource Strain:   . Difficulty of Paying Living Expenses:   Food Insecurity:   . Worried About Charity fundraiser in the Last Year:   . Arboriculturist in the Last Year:   Transportation Needs:   . Lexicographer (Medical):   Marland Kitchen Lack of Transportation (Non-Medical):   Physical Activity:   . Days of Exercise per Week:   . Minutes of Exercise per Session:   Stress:   . Feeling of Stress :   Social Connections:   . Frequency of Communication with Friends and Family:   . Frequency of Social Gatherings with Friends and Family:   . Attends Religious Services:   . Active Member of Clubs or Organizations:   . Attends Archivist Meetings:   Marland Kitchen Marital Status:   Intimate Partner Violence:   . Fear of Current or Ex-Partner:   . Emotionally Abused:   Marland Kitchen Physically Abused:   . Sexually Abused:     Outpatient Medications Prior to Visit  Medication Sig Dispense Refill  . amLODipine (NORVASC) 10 MG tablet Take 1 tablet (10 mg total) by mouth daily. 90 tablet 1  . aspirin 81 MG chewable tablet Chew 81 mg by mouth daily.    Marland Kitchen  atorvastatin (LIPITOR) 80 MG tablet Take 1 tablet (80 mg total) by mouth daily. 90 tablet 3  . diclofenac Sodium (VOLTAREN) 1 % GEL Apply 4 g topically 4 (four) times daily. 150 g 1  . fluticasone (FLONASE) 50 MCG/ACT nasal spray Place 2 sprays into both nostrils daily. (Patient taking differently: Place 2 sprays into both nostrils daily as needed for allergies or rhinitis. ) 16 g 2  . furosemide (LASIX) 20 MG tablet Take 1 tablet (20 mg total) by mouth daily. 30 tablet 11  . hydrochlorothiazide (HYDRODIURIL) 25 MG tablet Take 1 tablet (25 mg total) by mouth daily. 90 tablet 1  . IBU 600 MG tablet TAKE 1 TABLET EVERY 6 HOURS AS NEEDED 90 tablet 0  . metoprolol succinate (TOPROL-XL) 50 MG 24 hr tablet Take 1 tablet (50 mg total) by mouth daily. Take with or immediately following a meal. 90 tablet 1  . oxymetazoline (AFRIN NASAL SPRAY) 0.05 % nasal spray Place 1 spray into both nostrils 2 (two) times daily as needed (Nose bleeds). 30 mL 0   No facility-administered medications prior to visit.    Allergies  Allergen Reactions  . Sulfamethoxazole-Trimethoprim Rash      Red rashes    Review of Systems  Constitutional: Negative for chills and fever.  HENT: Negative for congestion, sinus pain and sore throat.   Neurological: Negative for dizziness.       Objective:    Physical Exam Constitutional:      General: She is not in acute distress.    Appearance: Normal appearance.  HENT:     Right Ear: Hearing and tympanic membrane normal.     Left Ear: External ear normal. Decreased hearing noted. There is impacted cerumen.     Nose: Nose normal.     Mouth/Throat:     Mouth: Mucous membranes are moist.     Pharynx: Oropharynx is clear. No oropharyngeal exudate or posterior oropharyngeal erythema.  Neurological:     Mental Status: She is alert.     BP 130/65 (BP Location: Left Arm, Patient Position: Sitting, Cuff Size: Normal)   Pulse 64   Temp 98 F (36.7 C) (Oral)   Ht 5' 4.5" (1.638 m)   Wt 174 lb 1.6 oz (79 kg)   SpO2 99% Comment: room air  BMI 29.42 kg/m  Wt Readings from Last 3 Encounters:  09/23/19 174 lb 1.6 oz (79 kg)  06/23/19 174 lb 4.8 oz (79.1 kg)  03/16/19 176 lb 8 oz (80.1 kg)    Health Maintenance Due  Topic Date Due  . Hepatitis C Screening  Never done  . COVID-19 Vaccine (1) Never done    There are no preventive care reminders to display for this patient.   Lab Results  Component Value Date   TSH 1.099 08/02/2014   Lab Results  Component Value Date   WBC 4.1 01/17/2018   HGB 11.5 01/17/2018   HCT 33.8 (L) 01/17/2018   MCV 85 01/17/2018   PLT 275 01/17/2018   Lab Results  Component Value Date   NA 144 09/29/2018   K 3.7 09/29/2018   CO2 25 09/29/2018   GLUCOSE 100 (H) 09/29/2018   BUN 14 09/29/2018   CREATININE 0.79 09/29/2018   BILITOT 0.8 09/29/2018   ALKPHOS 98 09/29/2018   AST 15 09/29/2018   ALT 18 09/29/2018   PROT 7.2 09/29/2018   ALBUMIN 4.6 09/29/2018   CALCIUM 9.6 09/29/2018   ANIONGAP 9 09/06/2017   Lab Results  Component Value Date   CHOL 146 09/05/2017   Lab Results   Component Value Date   HDL 44 09/05/2017   Lab Results  Component Value Date   LDLCALC 89 09/05/2017   Lab Results  Component Value Date   TRIG 67 09/05/2017   Lab Results  Component Value Date   CHOLHDL 3.3 09/05/2017   Lab Results  Component Value Date   HGBA1C 5.4 09/05/2017       Assessment & Plan:   Problem List Items Addressed This Visit      Nervous and Auditory   Cerumen impaction (Chronic)    Patient presents with 2 weeks of progressive hearing loss, particularly in the left ear. She has tried home irrigation without benefit. She states she normally requires irrigation at the clinic about once a year.   Irrigation of ears today resulted in immediate improvement in hearing. On repeat exam I am able to visualize both TMs without residual cerumen of inflammation.           No orders of the defined types were placed in this encounter.    Delice Bison, DO

## 2019-09-28 NOTE — Progress Notes (Signed)
Internal Medicine Clinic Attending  Case discussed with Dr. Bloomfield  At the time of the visit.  We reviewed the resident's history and exam and pertinent patient test results.  I agree with the assessment, diagnosis, and plan of care documented in the resident's note.  

## 2019-12-11 DIAGNOSIS — Z0100 Encounter for examination of eyes and vision without abnormal findings: Secondary | ICD-10-CM | POA: Diagnosis not present

## 2020-01-15 IMAGING — CR DG CHEST 2V
2 series · 2 of 2 positions shown · non-contrast
Comparison: Chest radiograph dated 02/27/2013

CLINICAL DATA: 66-year-old female with chest pressure.

EXAM:
CHEST - 2 VIEW

[chest pa]
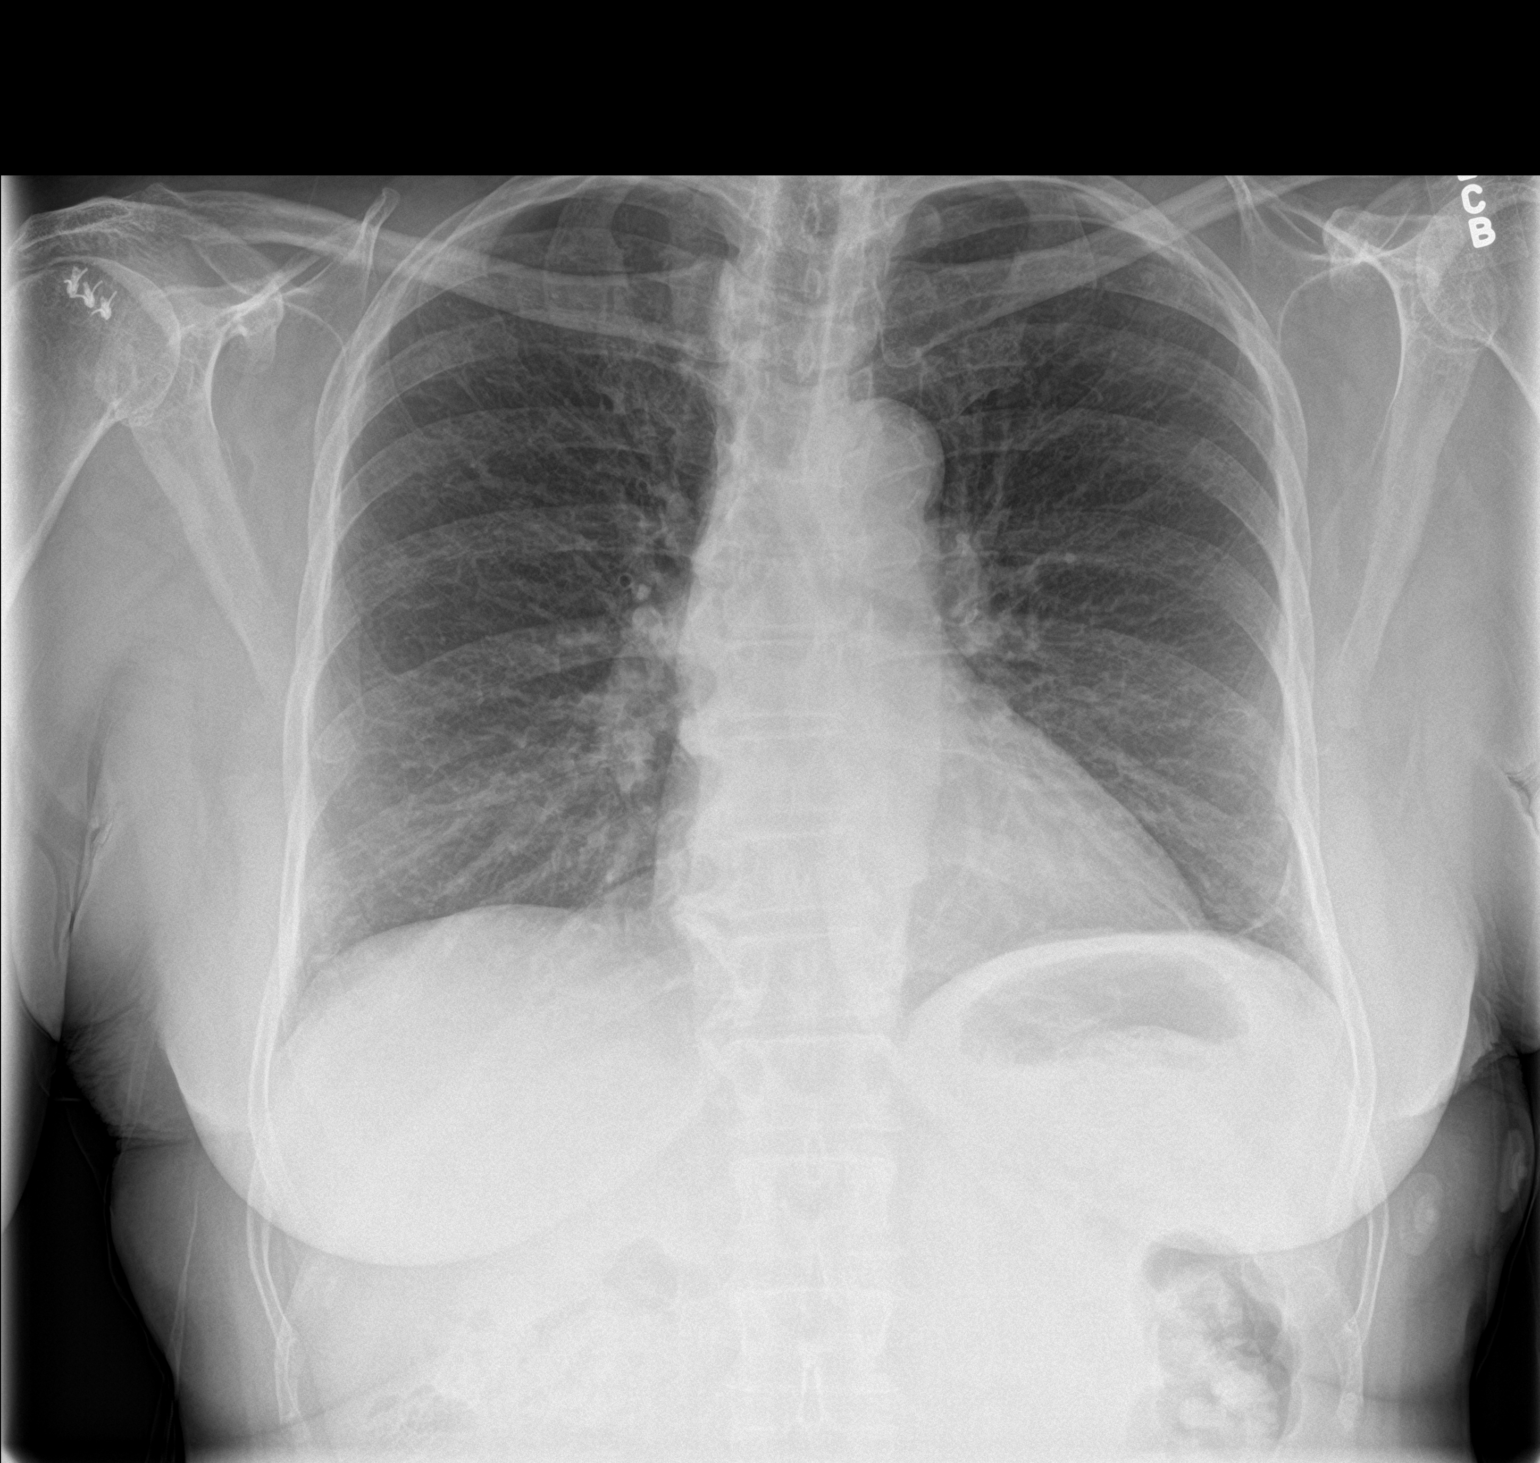

[chest lat]
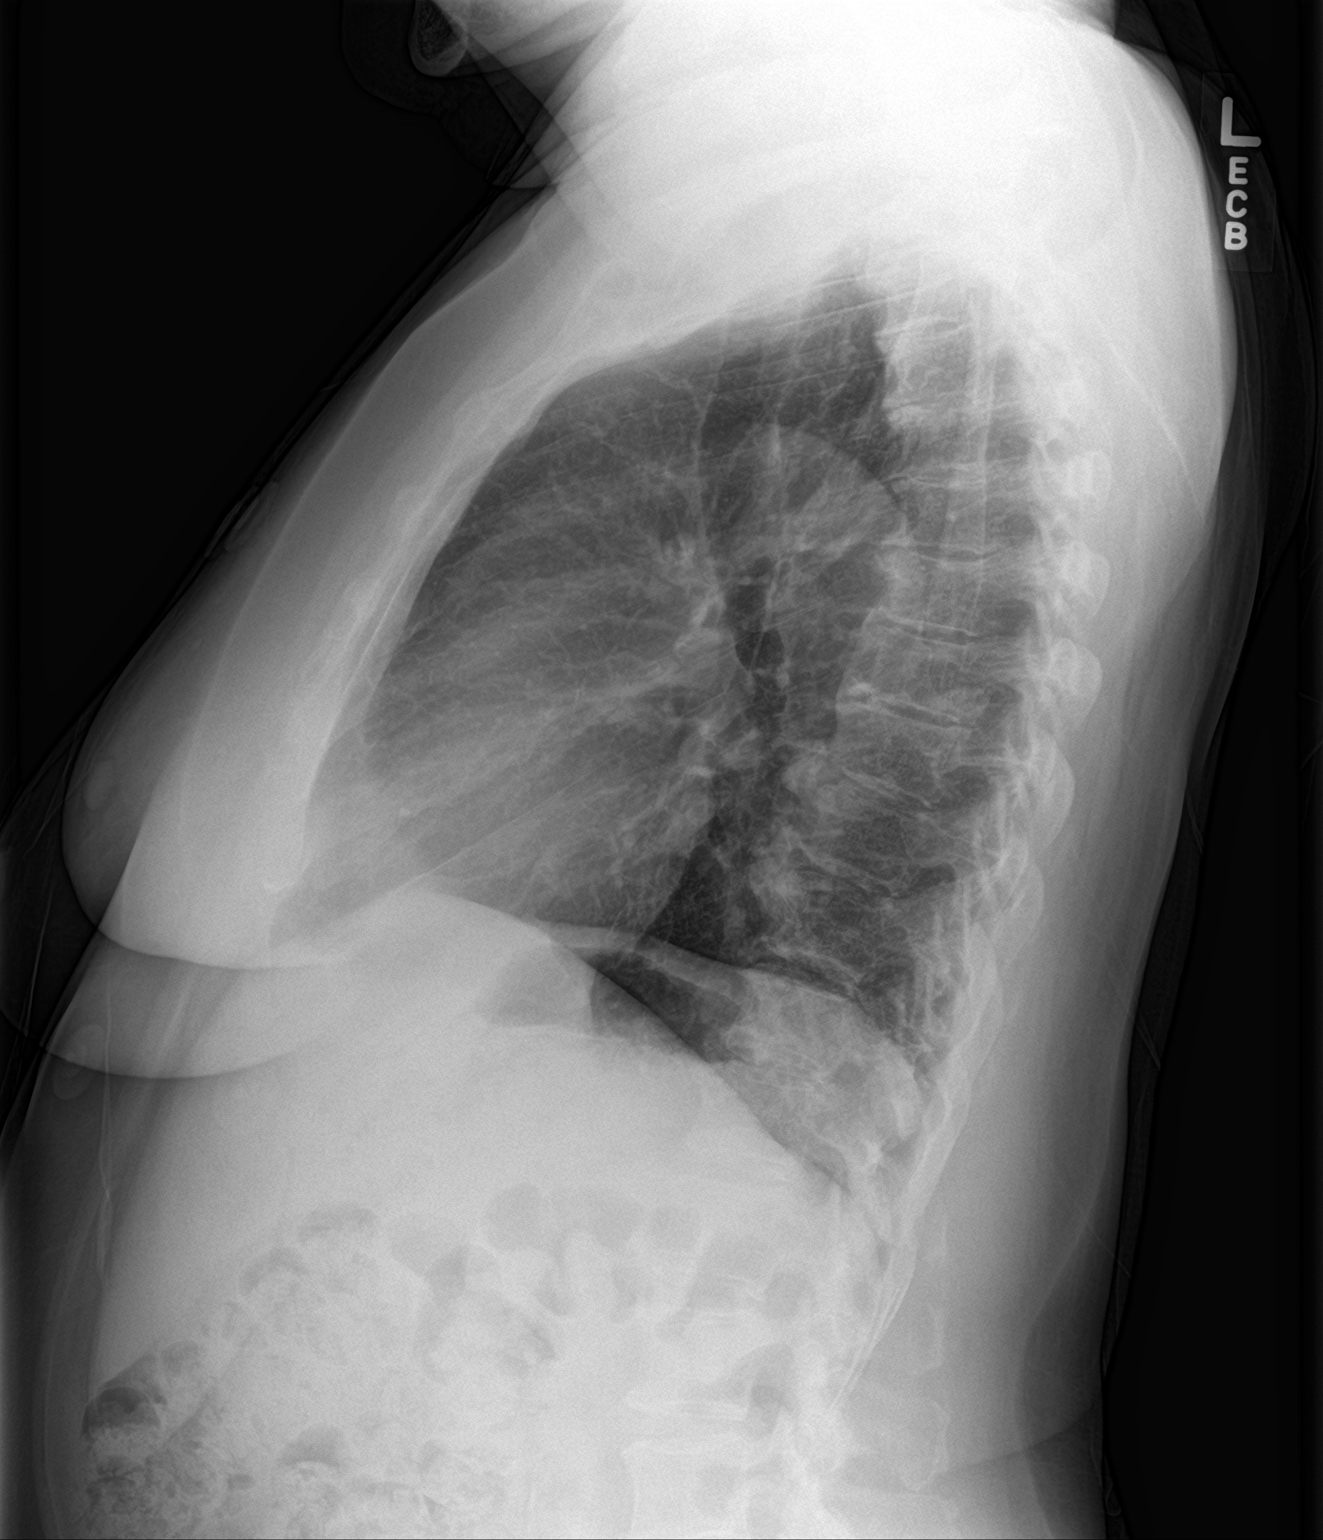

[2 of 2 positions shown; findings below may reference images not displayed]

FINDINGS: The lungs are clear. There is no pleural effusion or pneumothorax.
The cardiac silhouette is within normal limits. There is
atherosclerotic calcification of the aortic arch. No acute osseous
pathology. Right shoulder rotator cuff pins noted.
IMPRESSION: No active cardiopulmonary disease.

## 2020-01-21 ENCOUNTER — Other Ambulatory Visit: Payer: Self-pay | Admitting: Internal Medicine

## 2020-01-21 DIAGNOSIS — M170 Bilateral primary osteoarthritis of knee: Secondary | ICD-10-CM

## 2020-01-21 DIAGNOSIS — M47895 Other spondylosis, thoracolumbar region: Secondary | ICD-10-CM

## 2020-01-21 DIAGNOSIS — I10 Essential (primary) hypertension: Secondary | ICD-10-CM

## 2020-02-23 ENCOUNTER — Other Ambulatory Visit: Payer: Self-pay | Admitting: Student

## 2020-02-23 DIAGNOSIS — M47895 Other spondylosis, thoracolumbar region: Secondary | ICD-10-CM

## 2020-02-23 DIAGNOSIS — M170 Bilateral primary osteoarthritis of knee: Secondary | ICD-10-CM

## 2020-04-02 ENCOUNTER — Other Ambulatory Visit: Payer: Self-pay | Admitting: Internal Medicine

## 2020-04-02 DIAGNOSIS — M47895 Other spondylosis, thoracolumbar region: Secondary | ICD-10-CM

## 2020-04-02 DIAGNOSIS — M170 Bilateral primary osteoarthritis of knee: Secondary | ICD-10-CM

## 2020-05-14 ENCOUNTER — Other Ambulatory Visit: Payer: Self-pay | Admitting: Internal Medicine

## 2020-05-14 DIAGNOSIS — M47895 Other spondylosis, thoracolumbar region: Secondary | ICD-10-CM

## 2020-05-14 DIAGNOSIS — M170 Bilateral primary osteoarthritis of knee: Secondary | ICD-10-CM

## 2020-06-06 ENCOUNTER — Encounter: Payer: Self-pay | Admitting: Internal Medicine

## 2020-06-06 ENCOUNTER — Other Ambulatory Visit: Payer: Self-pay

## 2020-06-06 ENCOUNTER — Ambulatory Visit (INDEPENDENT_AMBULATORY_CARE_PROVIDER_SITE_OTHER): Payer: Medicare HMO | Admitting: Internal Medicine

## 2020-06-06 DIAGNOSIS — I10 Essential (primary) hypertension: Secondary | ICD-10-CM | POA: Diagnosis not present

## 2020-06-06 NOTE — Assessment & Plan Note (Addendum)
Blood pressure is at goal in the office today.  -continue amlodipine 10mg  and hctz 25mg  -labs will be obtained when she follows up in April

## 2020-06-06 NOTE — Progress Notes (Signed)
Office Visit   Patient ID: Victoria Campos, female    DOB: 1950-06-25, 70 y.o.   MRN: [id number]  Subjective:  CC: chronic hypertension, DMV paperwork  Victoria Campos is a 70 y.o. year old female who presents for a blood pressure check and a physical to complete her DMV paperwork. Please refer to problem based charting for assessment and plan.      ACTIVE MEDICATIONS   Outpatient Medications Prior to Visit  Medication Sig Dispense Refill  . amLODipine (NORVASC) 10 MG tablet TAKE 1 TABLET EVERY DAY 90 tablet 1  . aspirin 81 MG chewable tablet Chew 81 mg by mouth daily.    Marland Kitchen atorvastatin (LIPITOR) 80 MG tablet TAKE 1 TABLET EVERY DAY 90 tablet 1  . diclofenac Sodium (VOLTAREN) 1 % GEL Apply 4 g topically 4 (four) times daily. 150 g 1  . fluticasone (FLONASE) 50 MCG/ACT nasal spray Place 2 sprays into both nostrils daily. (Patient taking differently: Place 2 sprays into both nostrils daily as needed for allergies or rhinitis. ) 16 g 2  . furosemide (LASIX) 20 MG tablet TAKE 1 TABLET EVERY DAY 90 tablet 1  . hydrochlorothiazide (HYDRODIURIL) 25 MG tablet TAKE 1 TABLET EVERY DAY 90 tablet 1  . IBU 600 MG tablet TAKE 1 TABLET EVERY 6 HOURS AS NEEDED 90 tablet 1  . metoprolol succinate (TOPROL-XL) 50 MG 24 hr tablet TAKE 1 TABLET EVERY DAY  WITH  OR  IMMEDIATELY FOLLOWING A MEAL 90 tablet 1  . oxymetazoline (AFRIN NASAL SPRAY) 0.05 % nasal spray Place 1 spray into both nostrils 2 (two) times daily as needed (Nose bleeds). 30 mL 0   No facility-administered medications prior to visit.     Objective:   BP 120/73 (BP Location: Left Arm, Patient Position: Sitting, Cuff Size: Normal)   Pulse 74   Temp 98.7 F (37.1 C) (Oral)   Ht 5' 4.5" (1.638 m)   Wt 179 lb 4.8 oz (81.3 kg)   SpO2 99% Comment: room air  BMI 30.30 kg/m  Wt Readings from Last 3 Encounters:  06/06/20 179 lb 4.8 oz (81.3 kg)  09/23/19 174 lb 1.6 oz (79 kg)  06/23/19 174 lb 4.8 oz (79.1 kg)   BP Readings from Last 3  Encounters:  06/06/20 120/73  09/23/19 130/65  06/23/19 112/76   Physical Exam Constitutional:      Appearance: Normal appearance.  Cardiovascular:     Rate and Rhythm: Normal rate and regular rhythm.  Pulmonary:     Effort: Pulmonary effort is normal.     Breath sounds: Normal breath sounds.  Neurological:     Mental Status: She is alert and oriented to person, place, and time.     Health Maintenance:   Health Maintenance  Topic Date Due  . Hepatitis C Screening  Never done  . COVID-19 Vaccine (1) Never done  . INFLUENZA VACCINE  10/11/2019  . MAMMOGRAM  07/27/2021  . COLONOSCOPY (Pts 45-68yrs Insurance coverage will need to be confirmed)  08/31/2021  . TETANUS/TDAP  10/21/2022  . DEXA SCAN  Completed  . PNA vac Low Risk Adult  Completed  . HPV VACCINES  Aged Out     Assessment & Plan:   Problem List Items Addressed This Visit      Cardiovascular and Mediastinum   Essential hypertension    Blood pressure is at goal in the office today.  -continue amlodipine 10mg  and hctz 25mg  -labs will be obtained when she follows up  in April        DMV paperwork completed at today's visit. Paperwork given to the back office to complete process.  Follow up with Dr. Lisabeth Devoid, [facility] [place], on 06/23/20 for follow up of chronic medical conditions  Pt discussed with Dr. Forde Radon, MD Internal Medicine Resident PGY-2 [facility] Pager: [phone] 06/07/2020 8:53 AM

## 2020-06-08 ENCOUNTER — Other Ambulatory Visit: Payer: Self-pay | Admitting: Student

## 2020-06-08 DIAGNOSIS — I10 Essential (primary) hypertension: Secondary | ICD-10-CM

## 2020-06-09 ENCOUNTER — Encounter: Payer: Self-pay | Admitting: *Deleted

## 2020-06-09 NOTE — Progress Notes (Unsigned)

## 2020-06-15 NOTE — Progress Notes (Signed)
Internal Medicine Clinic Attending  Case discussed with Dr. Christian  At the time of the visit.  We reviewed the resident's history and exam and pertinent patient test results.  I agree with the assessment, diagnosis, and plan of care documented in the resident's note.  

## 2020-06-21 NOTE — Progress Notes (Unsigned)
Things That May Be Affecting Your Health:  Alcohol  Hearing loss  Pain    Depression  Home Safety  Sexual Health   Diabetes + Lack of physical activity  Stress   Difficulty with daily activities  Loneliness  Tiredness   Drug use  Medicines  Tobacco use   Falls  Motor Vehicle Safety + Weight   Food choices  Oral Health  Other    YOUR PERSONALIZED HEALTH PLAN : 1. Schedule your next subsequent Medicare Wellness visit in one year 2. Attend all of your regular appointments to address your medical issues 3. Complete the preventative screenings and services   Annual Wellness Visit   Medicare Covered Preventative Screenings and [place] Men and Women Who How Often Need? Date of Last Service Action  Abdominal Aortic Aneurysm Adults with AAA risk factors Once      Alcohol Misuse and Counseling All Adults Screening once a year if no alcohol misuse. Counseling up to 4 face to face sessions.     Bone Density Measurement  Adults at risk for osteoporosis Once every 2 yrs Y 09/23/2015    Lipid Panel Z13.6 All adults without CV disease Once every 5 yrs       Colorectal Cancer   Stool sample or  Colonoscopy All adults 48 and older   Once every year  Every 10 years        Depression All Adults Once a year  Today   Diabetes Screening Blood glucose, post glucose load, or GTT Z13.1  All adults at risk  Pre-diabetics  Once per year  Twice per year      Diabetes  Self-Management Training All adults Diabetics 10 hrs first year; 2 hours subsequent years. Requires Copay     Glaucoma  Diabetics  Family history of glaucoma  African Americans 50 yrs +  Hispanic Americans 33 yrs + Annually - requires coppay      Hepatitis C Z72.89 or F19.20  High Risk for HCV  Born between 1945 and 1965  Annually  Once      HIV Z11.4 All adults based on risk  Annually btw ages 73 & 12 regardless of risk  Annually > 65 yrs if at increased risk      Lung Cancer  Screening Asymptomatic adults aged 27-77 with 30 pack yr history and current smoker OR quit within the last 15 yrs Annually Must have counseling and shared decision making documentation before first screen      Medical Nutrition Therapy Adults with   Diabetes  Renal disease  Kidney transplant within past 3 yrs 3 hours first year; 2 hours subsequent years     Obesity and Counseling All adults Screening once a year Counseling if BMI 30 or higher  Today   Tobacco Use Counseling Adults who use tobacco  Up to 8 visits in one year     Vaccines Z23  Hepatitis B  Influenza   Pneumonia  Adults   Once  Once every flu season  Two different vaccines separated by one year     Next Annual Wellness Visit People with Medicare Every year  Today     Services & Screenings Women Who How Often Need  Date of Last Service Action  Mammogram  Z12.31 Women over 51 One baseline ages 20-39. Annually ager 40 yrs+      Pap tests All women Annually if high risk. Every 2 yrs for normal risk women  Screening for cervical cancer with   Pap (Z01.419 nl or Z01.411abnl) &  HPV Z11.51 Women aged 28 to 15 Once every 5 yrs     Screening pelvic and breast exams All women Annually if high risk. Every 2 yrs for normal risk women     Sexually Transmitted Diseases  Chlamydia  Gonorrhea  Syphilis All at risk adults Annually for non pregnant females at increased risk         [place] Men Who How Ofter Need  Date of Last Service Action  Prostate Cancer - DRE & PSA Men over 50 Annually.  DRE might require a copay.        Sexually Transmitted Diseases  Syphilis All at risk adults Annually for men at increased risk      Health Maintenance List Health Maintenance  Topic Date Due  . Hepatitis C Screening  Never done  . COVID-19 Vaccine (1) Never done  . INFLUENZA VACCINE  10/10/2020  . MAMMOGRAM  07/27/2021  . COLONOSCOPY (Pts 45-45yrs Insurance coverage will need to be  confirmed)  08/31/2021  . TETANUS/TDAP  10/21/2022  . DEXA SCAN  Completed  . PNA vac Low Risk Adult  Completed  . HPV VACCINES  Aged Out

## 2020-06-23 ENCOUNTER — Encounter: Payer: Medicare HMO | Admitting: Student

## 2020-06-28 ENCOUNTER — Ambulatory Visit (INDEPENDENT_AMBULATORY_CARE_PROVIDER_SITE_OTHER): Payer: Medicare HMO | Admitting: Internal Medicine

## 2020-06-28 VITALS — BP 110/63

## 2020-06-28 DIAGNOSIS — H6123 Impacted cerumen, bilateral: Secondary | ICD-10-CM

## 2020-06-28 NOTE — Patient Instructions (Signed)
Thank you for allowing [place] to provide your care today. Today we discussed your ear wax and hearing.   Please follow-up in 6 months.    Should you have any questions or concerns please call the internal medicine clinic at [phone].

## 2020-06-28 NOTE — Progress Notes (Signed)
   CC: bilateral ear impaction  HPI:  Victoria Campos is a 70 y.o. with a past medical history listed below presenting for ear lavage. For details of today's visit and the status of his chronic medical issues please refer to the assessment and plan.  Past Medical History:  Diagnosis Date  . ANXIETY 03/10/2004   Qualifier: Diagnosis of  By: Radene Ou MD, [place]    . Heart murmur   . Hypercholesteremia   . Hypertension   . Insomnia   . Pulmonary embolism ([place]) 09/23/2011   Acute segmental bilateral segmental PE, neg dopplers, neg coag w/u    Review of Systems:   Review of Systems  Constitutional: Negative for chills and fever.  HENT: Positive for hearing loss. Negative for ear discharge, ear pain and tinnitus.      Physical Exam:  Vitals:   06/28/20 1043  BP: 110/63   Physical Exam Constitutional:      Appearance: Normal appearance.  HENT:     Right Ear: Tympanic membrane, ear canal and external ear normal. There is no impacted cerumen.     Left Ear: Tympanic membrane, ear canal and external ear normal. There is no impacted cerumen.  Cardiovascular:     Rate and Rhythm: Normal rate and regular rhythm.     Pulses: Normal pulses.     Heart sounds: Normal heart sounds. No murmur heard. No friction rub. No gallop.   Pulmonary:     Effort: Pulmonary effort is normal. No respiratory distress.     Breath sounds: Normal breath sounds. No wheezing.  Musculoskeletal:        General: No swelling or tenderness.     Right lower leg: No edema.     Left lower leg: No edema.  Skin:    General: Skin is warm and dry.  Neurological:     Mental Status: She is alert and oriented to person, place, and time.     Motor: No weakness.     Gait: Gait normal.  Psychiatric:        Mood and Affect: Mood normal.        Behavior: Behavior normal.        Thought Content: Thought content normal.        Judgment: Judgment normal.     Assessment & Plan:   See Encounters Tab for problem  based charting.  Patient discussed with Dr. Heber [place]

## 2020-06-28 NOTE — Assessment & Plan Note (Addendum)
Patient presents with a couple weeks of hearing loss which she describes as muffled sounding.  She states that she presents to the clinic about twice a year for irrigation of bilateral ears.  She states that the home kits do not alleviate her hearing loss.  She states after getting her ears irrigated she typically feels immediate provement in her hearing.  Bilateral ears irrigated today with immediate provement in hearing.  On exam, both tympanic membranes visualized normal appearance.  No residual impaction visualized.  Discussed if her hearing worsens or symptoms persist despite irrigation can refer to audiology and ENT.  Plan to follow-up in 6 months.

## 2020-06-28 NOTE — Progress Notes (Signed)
CMA successfully irrigated ears-bilaterally.  Pt tolerated procedure well and CMA was able to visualize both eardrums. Regenia Skeeter, Jerrad Mendibles Cassady4/19/202211:17 AM

## 2020-07-04 NOTE — Progress Notes (Signed)
Internal Medicine Clinic Attending ° °Case discussed with Dr. Rehman  At the time of the visit.  We reviewed the resident’s history and exam and pertinent patient test results.  I agree with the assessment, diagnosis, and plan of care documented in the resident’s note.  ° °

## 2020-08-04 ENCOUNTER — Encounter: Payer: Medicare HMO | Admitting: Internal Medicine

## 2020-08-04 ENCOUNTER — Telehealth: Payer: Self-pay | Admitting: *Deleted

## 2020-08-04 NOTE — Telephone Encounter (Signed)
Call from pt this am stating her left arm (from wrist to elbow) is "tender".  Pt states she had similar symptoms when she had a "blood clot" 4-5 yrs ago. Takes an aspirin daily.  Pt was given an appt for today but declined.  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  CMA advised pt to go to Urgent Care this evening, but again declined.  Pt requested an appt for "next week"-1st avail due to upcoming holiday will be 06/01, but pt states the time is not good for her.   Pt states she will go to Urgent Care tomorrow morning.  Pt instructed to go to ED should she develop any swelling, shortness of breath, nausea, dizziness, or area becomes warm to the touch or turns blue Pt agreed  .Despina Hidden Cassady5/26/20224:26 PM

## 2020-08-05 ENCOUNTER — Ambulatory Visit (INDEPENDENT_AMBULATORY_CARE_PROVIDER_SITE_OTHER): Payer: Medicare HMO

## 2020-08-05 ENCOUNTER — Ambulatory Visit (HOSPITAL_COMMUNITY)
Admission: EM | Admit: 2020-08-05 | Discharge: 2020-08-05 | Disposition: A | Discharge status: Home / Self Care | Payer: Medicare HMO | Attending: Family Medicine | Admitting: Family Medicine

## 2020-08-05 ENCOUNTER — Other Ambulatory Visit: Payer: Self-pay

## 2020-08-05 ENCOUNTER — Ambulatory Visit (HOSPITAL_BASED_OUTPATIENT_CLINIC_OR_DEPARTMENT_OTHER)
Admit: 2020-08-05 | Discharge: 2020-08-05 | Disposition: A | Discharge status: Home / Self Care | Payer: Medicare HMO | Attending: Family Medicine | Admitting: Family Medicine

## 2020-08-05 ENCOUNTER — Telehealth (HOSPITAL_COMMUNITY): Payer: Self-pay | Admitting: Family Medicine

## 2020-08-05 ENCOUNTER — Encounter (HOSPITAL_COMMUNITY): Payer: Self-pay

## 2020-08-05 DIAGNOSIS — Z86718 Personal history of other venous thrombosis and embolism: Secondary | ICD-10-CM | POA: Diagnosis not present

## 2020-08-05 DIAGNOSIS — M19012 Primary osteoarthritis, left shoulder: Secondary | ICD-10-CM | POA: Diagnosis not present

## 2020-08-05 DIAGNOSIS — M7989 Other specified soft tissue disorders: Secondary | ICD-10-CM | POA: Diagnosis not present

## 2020-08-05 DIAGNOSIS — M25512 Pain in left shoulder: Secondary | ICD-10-CM | POA: Insufficient documentation

## 2020-08-05 NOTE — Telephone Encounter (Signed)
Called patient and left message to call back to discuss negative DVT ultrasound of the left upper extremity and left shoulder x-ray showing osteoarthritis but no other bony abnormality.  She should take over-the-counter pain medications as needed and follow-up with her primary care for her left arm pain.

## 2020-08-05 NOTE — ED Triage Notes (Deleted)
Pt c/o left ear pain X 4 days.  Pt states the pain started at the neck.  She states she feels she was bitten and states she has been in pain since. Pt states she has a knot under the left ear.

## 2020-08-05 NOTE — ED Triage Notes (Signed)
Pt presents with left arm tenderness, bilateral leg swelling, and knee pain xs 2 weeks.

## 2020-08-05 NOTE — ED Notes (Signed)
[place] scheduled for 1500 at [facility]

## 2020-08-05 NOTE — Progress Notes (Signed)
Left upper extremity venous dupelx has been completed. Preliminary results can be found in CV Proc through chart review.  Results were given to [facility] at [facility] office.  08/05/20 2:59 PM Carlos Levering RVT

## 2020-08-08 NOTE — ED Provider Notes (Signed)
[place]    CSN: [id number] Arrival date & time: 08/05/20  1254      History   Chief Complaint Chief Complaint  Patient presents with  . Arm Pain  . Leg Swelling  . Knee Pain    HPI Victoria Campos is a 70 y.o. female.   Patient presenting today with about 2-week history of left elbow and wrist swelling, tenderness.  She denies any injury to either area and is not having numbness or tingling sensations, discoloration, decreased range of motion, chest pain, shortness of breath, palpitations.  States she has a history of upper extremity DVT in this arm, no longer on anticoagulation.  Taking over-the-counter pain relievers with minimal relief.  Also has some ongoing left shoulder pain and stiffness, no known injury for this.  Seen PCP for this to this point.     Past Medical History:  Diagnosis Date  . ANXIETY 03/10/2004   Qualifier: Diagnosis of  By: Radene Ou MD, [place]    . Heart murmur   . Hypercholesteremia   . Hypertension   . Insomnia   . Pulmonary embolism ([place]) 09/23/2011   Acute segmental bilateral segmental PE, neg dopplers, neg coag w/u     Patient Active Problem List   Diagnosis Date Noted  . Chronic heart failure with preserved ejection fraction ([place]) 09/29/2018  . Bilateral lower extremity edema 09/29/2018  . Encounter for screening mammogram for breast cancer 09/29/2018  . Epistaxis 01/17/2018  . Grief 12/22/2017  . Chest pain 09/05/2017  . Cerumen impaction 02/16/2015  . Essential hypertension 10/15/2014  . Osteoarthritis of both knees 10/15/2014  . Osteoarthritis of back 01/27/2014  . Healthcare maintenance 09/17/2013  . Myopia of left eye 03/20/2013  . HEARING LOSS, SENSORINEURAL, BILATERAL 12/27/2009  . TINNITUS 03/14/2009  . Left shoulder pain 04/20/2008  . BENIGN PAROXYSMAL POSITIONAL VERTIGO 10/21/2007  . ALLERGIC RHINITIS, SEASONAL 08/24/2004  . Hyperlipidemia 04/21/2004  . DEPRESSION 03/10/2004    Past Surgical History:   Procedure Laterality Date  . arm surgery     torn ligamint  . bunyon     removed  . tubal ligation      OB History   No obstetric history on file.      Home Medications    Prior to Admission medications   Medication Sig Start Date End Date Taking? Authorizing Provider  amLODipine (NORVASC) 10 MG tablet TAKE 1 TABLET EVERY DAY 06/16/20   Rehman, Areeg N, DO  aspirin 81 MG chewable tablet Chew 81 mg by mouth daily.    [provider]  atorvastatin (LIPITOR) 80 MG tablet TAKE 1 TABLET EVERY DAY 06/16/20   Rehman, Areeg N, DO  diclofenac Sodium (VOLTAREN) 1 % GEL Apply 4 g topically 4 (four) times daily. 06/23/19   Bloomfield, Carley D, DO  fluticasone (FLONASE) 50 MCG/ACT nasal spray Place 2 sprays into both nostrils daily. Patient taking differently: Place 2 sprays into both nostrils daily as needed for allergies or rhinitis.  10/05/16   Rice, Resa Miner, MD  furosemide (LASIX) 20 MG tablet TAKE 1 TABLET EVERY DAY. 06/16/20   Rehman, Areeg N, DO  hydrochlorothiazide (HYDRODIURIL) 25 MG tablet TAKE 1 TABLET EVERY DAY 06/16/20   Rehman, Areeg N, DO  IBU 600 MG tablet TAKE 1 TABLET EVERY 6 HOURS AS NEEDED 05/18/20   Darrick Meigs, Rylee, MD  metoprolol succinate (TOPROL-XL) 50 MG 24 hr tablet TAKE 1 TABLET EVERY DAY  WITH  OR  IMMEDIATELY FOLLOWING A MEAL 06/16/20  Rehman, Areeg N, DO  oxymetazoline (AFRIN NASAL SPRAY) 0.05 % nasal spray Place 1 spray into both nostrils 2 (two) times daily as needed (Nose bleeds). 01/17/18   Asencion Noble, MD    Family History Family History  Problem Relation Age of Onset  . Cancer Brother        colon  . Cancer Sister        colon  . Cancer Sister        breast  . Heart attack Father        died at age 63  . Heart disease Father     Social History Social History   Tobacco Use  . Smoking status: Former Smoker    Types: Cigarettes    Quit date: 03/12/1968    Years since quitting: 52.4  . Smokeless tobacco: Never Used  Substance Use  Topics  . Alcohol use: Yes    Alcohol/week: 1.0 standard drink    Types: 1 Glasses of wine per week    Comment: occasional  . Drug use: No     Allergies   Sulfamethoxazole-trimethoprim   Review of Systems Review of Systems Per HPI  Physical Exam Triage Vital Signs ED Triage Vitals  Enc Vitals Group     BP 08/05/20 1338 119/77     Pulse Rate 08/05/20 1338 64     Resp 08/05/20 1338 16     Temp 08/05/20 1338 99 F (37.2 C)     Temp Source 08/05/20 1338 Oral     SpO2 08/05/20 1338 98 %     Weight --      Height --      Head Circumference --      Peak Flow --      Pain Score 08/05/20 1339 8     Pain Loc --      Pain Edu? --      Excl. in [place]? --    No data found.  Updated Vital Signs BP 119/77 (BP Location: Left Arm)   Pulse 64   Temp 99 F (37.2 C) (Oral)   Resp 16   SpO2 98%   Visual Acuity Right Eye Distance:   Left Eye Distance:   Bilateral Distance:    Right Eye Near:   Left Eye Near:    Bilateral Near:     Physical Exam Vitals and nursing note reviewed.  Constitutional:      Appearance: Normal appearance. She is not ill-appearing.  HENT:     Head: Atraumatic.  Eyes:     Extraocular Movements: Extraocular movements intact.     Conjunctiva/sclera: Conjunctivae normal.  Cardiovascular:     Rate and Rhythm: Normal rate and regular rhythm.     Heart sounds: Normal heart sounds.  Pulmonary:     Effort: Pulmonary effort is normal. No respiratory distress.     Breath sounds: Normal breath sounds. No wheezing or rales.  Musculoskeletal:        General: Normal range of motion.     Cervical back: Normal range of motion and neck supple.     Comments: Soft edematous area extensor surface of left wrist, feels most consistent with the texture of a lipoma.  Edematous medial left elbow, nondiscolored, minimally tender.  Good range of motion left elbow and wrist Good range of motion of left shoulder, mild crepitus with passive range of motion  Skin:     General: Skin is warm and dry.  Neurological:     Mental Status:  She is alert and oriented to person, place, and time.     Comments: Left upper extremity neurovascularly intact  Psychiatric:        Mood and Affect: Mood normal.        Thought Content: Thought content normal.        Judgment: Judgment normal.    UC Treatments / Results  Labs (all labs ordered are listed, but only abnormal results are displayed) Labs Reviewed - No data to display  EKG  Radiology No results found.  Procedures Procedures (including critical care time)  Medications Ordered in UC Medications - No data to display  Initial Impression / Assessment and Plan / UC Course  I have reviewed the triage vital signs and the nursing notes.  Pertinent labs & imaging results that were available during my care of the patient were reviewed by me and considered in my medical decision making (see chart for details).      We will obtain an upper extremity DVT rule out ultrasound given her history and new unprovoked swelling in the left upper extremity, though more consistent exam with lipoma.  ED precautions given for acutely worsening symptoms in the meantime.  Left shoulder x-ray negative for bony injury, showing osteoarthritis as expected given chronicity of her left shoulder pain and stiffness.  Discussed over-the-counter pain relievers and PCP follow-up for this.  Final Clinical Impressions(s) / UC Diagnoses   Final diagnoses:  Left arm swelling  History of DVT (deep vein thrombosis)  Acute pain of left shoulder   Discharge Instructions   None    ED Prescriptions    None     PDMP not reviewed this encounter.   Volney American, [place] 08/08/20 1901

## 2020-08-10 ENCOUNTER — Encounter: Payer: Medicare HMO | Admitting: Internal Medicine

## 2020-08-10 ENCOUNTER — Telehealth: Payer: Self-pay

## 2020-08-10 NOTE — Telephone Encounter (Signed)
Patient forgot appointment,she will call back to reschedule appointment [place], [place] C6/1/20224:27 PM

## 2020-08-18 ENCOUNTER — Encounter: Payer: Self-pay | Admitting: *Deleted

## 2020-09-07 ENCOUNTER — Ambulatory Visit (INDEPENDENT_AMBULATORY_CARE_PROVIDER_SITE_OTHER): Payer: Medicare HMO | Admitting: Student

## 2020-09-07 ENCOUNTER — Other Ambulatory Visit: Payer: Self-pay

## 2020-09-07 ENCOUNTER — Encounter: Payer: Self-pay | Admitting: Student

## 2020-09-07 VITALS — Ht 64.5 in | Wt 179.0 lb

## 2020-09-07 DIAGNOSIS — M8580 Other specified disorders of bone density and structure, unspecified site: Secondary | ICD-10-CM | POA: Diagnosis not present

## 2020-09-07 DIAGNOSIS — Z780 Asymptomatic menopausal state: Secondary | ICD-10-CM | POA: Diagnosis not present

## 2020-09-07 DIAGNOSIS — Z0000 Encounter for general adult medical examination without abnormal findings: Secondary | ICD-10-CM | POA: Diagnosis not present

## 2020-09-07 MED ORDER — FLUTICASONE PROPIONATE 50 MCG/ACT NA SUSP: 2.0000 | Freq: Every day | NASAL | 2 refills | Status: DC

## 2020-09-07 NOTE — Patient Instructions (Signed)
All Notes    Progress Notes by Mike Craze, DO at 06/09/2020 10:46 AM  Author: Mike Craze, DO Author Type: Resident Filed: 06/21/2020  4:52 PM  Note Status: Sign when Signing Visit Cosign: Cosign Not Required Encounter Date: 06/09/2020  Editor: Mike Craze, DO (Resident)             Things That May Be Affecting Your Health:   Alcohol   Hearing loss   Pain    Depression   Home Safety   Sexual Health    Diabetes + Lack of physical activity   Stress    Difficulty with daily activities   Loneliness   Tiredness    Drug use   Medicines   Tobacco use    Falls   Motor Vehicle Safety + Weight    Food choices   Oral Health   Other      [place] : 1. Schedule your next subsequent Medicare Wellness visit in one year 2. An order has been placed for a bone density scan (DEXA), please call [facility] at   [phone] to schedule the appointment. 3. Please call your pharmacy and schedule your 2nd Covid vaccine and your Shingrix vaccine. 4.  Please begin seated and standing exercises with exercise band to increase strength and balance.   5.  Please call our office at [phone] to make a follow up visit to meet your new PCP, Dr. Farrel Gordon.     Annual Wellness Visit                       Medicare Covered Preventative Screenings and Services   Services & Screenings Men and Women Who How Often Need? Date of Last Service Action  Abdominal Aortic Aneurysm Adults with AAA risk factors Once        Alcohol Misuse and Counseling All Adults Screening once a year if no alcohol misuse. Counseling up to 4 face to face sessions.        Bone Density Measurement Adults at risk for osteoporosis Once every 2 yrs Y 09/23/2015     Lipid Panel Z13.6 All adults without CV disease Once every 5 yrs            Colorectal Cancer Stool sample or Colonoscopy All adults 82 and older   Once every year Every 10 years              Depression All Adults Once a year   Today     Diabetes Screening Blood glucose, post glucose load, or GTT Z13.1 All adults at risk Pre-diabetics Once per year Twice per year          Diabetes  Self-Management Training All adults Diabetics 10 hrs first year; 2 hours subsequent years. Requires Copay        Glaucoma Diabetics Family history of glaucoma African Americans 77 yrs + Hispanic Americans 23 yrs + Annually - requires coppay          Hepatitis C Z72.89 or F19.20 High Risk for HCV Born between 1945 and 1965 Annually Once          HIV Z11.4 All adults based on risk Annually btw ages 35 & 23 regardless of risk Annually > 65 yrs if at increased risk          Lung Cancer Screening Asymptomatic adults aged 75-77 with 30 pack yr history and current smoker OR quit within the last 15 yrs  Annually Must have counseling and shared decision making documentation before first screen          Medical Nutrition Therapy Adults with Diabetes Renal disease Kidney transplant within past 3 yrs 3 hours first year; 2 hours subsequent years        Obesity and Counseling All adults Screening once a year Counseling if BMI 30 or higher   Today    Tobacco Use Counseling Adults who use tobacco Up to 8 visits in one year        Vaccines Z23 Hepatitis B Influenza Pneumonia Adults   Once Once every flu season Two different vaccines separated by one year        Next Annual Wellness Visit People with Medicare Every year   Today        Services & Screenings Women Who How Often Need Date of Last Service Action  Mammogram  Z12.31 Women over 91 One baseline ages 30-39. Annually ager 40 yrs+          Pap tests All women Annually if high risk. Every 2 yrs for normal risk women          Screening for cervical cancer with Pap (Z01.419 nl or Z01.411abnl) & HPV Z11.51 Women aged 59 to 62 Once every 5 yrs        Screening pelvic and breast exams All women Annually if high risk. Every 2 yrs for normal risk women        Sexually Transmitted  Diseases Chlamydia Gonorrhea Syphilis All at risk adults Annually for non pregnant females at increased risk                [place] Men Who How Ofter Need Date of Last Service Action  Prostate Cancer - DRE & PSA Men over 50 Annually. DRE might require a copay.              Sexually Transmitted Diseases Syphilis All at risk adults Annually for men at increased risk

## 2020-09-07 NOTE — Telephone Encounter (Signed)
Dr. Jacinto Reap,  This is the AWV which was on your schedule today.  She also wanted some Flonase sent over for seasonal allergies. Thanks, [employer]

## 2020-09-07 NOTE — Progress Notes (Addendum)
I discussed the AWV findings with the RN who conducted the visit. I was present in the office suite and immediately available to provide assistance and direction throughout the time the service was provided.   Sanjuan Dame, MD Internal Medicine PGY-1    This AWV is being conducted by [facility] only. The patient was located at home and I was located in [facility]. The patient's identity was confirmed using their DOB and current address. The patient or his/her legal guardian has consented to being evaluated through a telephone encounter and understands the associated risks (an examination cannot be done and the patient may need to come in for an appointment) / benefits (allows the patient to remain at home, decreasing exposure to coronavirus). I personally spent 40 minutes conducting the AWV.  Subjective:   Victoria Campos is a 70 y.o. female who presents for a Medicare Annual Wellness Visit.  The following items have been reviewed and updated today in the appropriate area in the EMR.   Health Risk Assessment  Height, weight, BMI, and BP Visual acuity if needed Depression screen Fall risk / safety level Advance directive discussion Medical and family history were reviewed and updated Updating list of other providers & suppliers Medication reconciliation, including over the counter medicines Cognitive screen Written screening schedule Risk Factor list Personalized health advice, risky behaviors, and treatment advice  Social History   Social History Narrative   Current Social History 09/07/2020        Patient lives with family in a home which is 1 story. There is one step up to the entrance the patient uses.       Patient's method of transportation is personal car and her daughter drives her to appointments as well.      The highest level of education was college diploma.      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      Identified  important Relationships are God and her children       Pets : None       Interests / Fun: Traveling with her family; cookouts       Current Stressors: None                 Objective:    Vitals: Ht 5' 4.5" (1.638 m)   Wt 179 lb (81.2 kg)   BMI 30.25 kg/m  Vitals are unable to obtained due to OFBPZ-02 public health emergency  Activities of Daily Living In your present state of health, do you have any difficulty performing the following activities: 09/07/2020 06/28/2020  Hearing? N N  Comment - -  Vision? N N  Difficulty concentrating or making decisions? N N  Walking or climbing stairs? Y Y  Comment bilateral knee pain knee  Dressing or bathing? N N  Doing errands, shopping? N N  Some recent data might be hidden    Goals  Goals       Blood Pressure < 140/90      Patient Stated (pt-stated)      Maintain current level of activity (dancing with children in daycare, exercise band)         Fall Risk Fall Risk  09/07/2020 06/28/2020 06/06/2020 09/23/2019 06/23/2019  Falls in the past year? 0 0 0 0 0  Number falls in past yr: - 0 - - -  Comment - - - - -  Injury with Fall? - 0 - - -  Comment - - - - -  Risk for fall due to : - No Fall Risks No Fall Risks No Fall Risks Impaired balance/gait  Follow up Falls evaluation completed Falls evaluation completed Falls prevention discussed Falls prevention discussed Falls prevention discussed    Depression Screen PHQ 2/9 Scores 09/07/2020 06/28/2020 09/23/2019 06/23/2019  PHQ - 2 Score 0 0 0 0  PHQ- 9 Score 2 - 0 -     Cognitive Testing Six-Item Cognitive Screener   "I would like to ask you some questions that ask you to use your memory. I am going to name three objects. Please wait until I say all three words, then repeat them. Remember what they are  because I am going to ask you to name them again in a few minutes. Please repeat these words for me: APPLE--TABLE--PENNY." (Interviewer may repeat names 3 times if necessary but  repetition not scored.)  Did patient correctly repeat all three words? Yes - may proceed with screen  What year is this? Correct What month is this? Correct What day of the week is this? Correct  What were the three objects I asked you to remember? Apple Correct Table Correct Penny Correct  Score one point for each incorrect answer.  A score of 2 or more points warrants additional investigation.  Patient's score 0     Assessment and Plan:    During the course of the visit the patient was educated and counseled about appropriate screening and preventive services as documented in the assessment and plan.  An order was placed for patient's DEXA scan.  Pt was encouraged to obtain her 2nd Covid booster vaccine and her Shingrix vaccine at her pharmacy.  The patient stated a goal to maintain her current activity level, but did express interest in using the exercise band for exercising CDC Handout on Fall Prevention and Handout on Home Exercise Program, Access codes [id number] and [id number] given/mailed to patient with exercise band.    The printed AVS was given to the patient and included an updated screening schedule, a list of risk factors, and personalized health advice.        Higinio Roger, RN  09/07/2020

## 2020-09-08 NOTE — Progress Notes (Signed)
Internal Medicine Clinic Attending  Case discussed with Dr.  Collene Gobble . We reviewed the AWV findings.  I agree with the assessment, diagnosis, and plan of care documented in the AWV note.

## 2020-11-02 ENCOUNTER — Other Ambulatory Visit: Payer: Self-pay | Admitting: Internal Medicine

## 2020-11-02 ENCOUNTER — Ambulatory Visit (INDEPENDENT_AMBULATORY_CARE_PROVIDER_SITE_OTHER): Payer: Medicare HMO | Admitting: Internal Medicine

## 2020-11-02 VITALS — BP 129/67 | HR 66 | Temp 97.6°F | Ht 64.0 in | Wt 184.7 lb

## 2020-11-02 DIAGNOSIS — I10 Essential (primary) hypertension: Secondary | ICD-10-CM | POA: Diagnosis not present

## 2020-11-02 DIAGNOSIS — J301 Allergic rhinitis due to pollen: Secondary | ICD-10-CM

## 2020-11-02 DIAGNOSIS — I5032 Chronic diastolic (congestive) heart failure: Secondary | ICD-10-CM

## 2020-11-02 DIAGNOSIS — H6123 Impacted cerumen, bilateral: Secondary | ICD-10-CM

## 2020-11-02 MED ORDER — METOPROLOL SUCCINATE ER 50 MG PO TB24: ORAL_TABLET | ORAL | 1 refills | Status: DC

## 2020-11-02 MED ORDER — ATORVASTATIN CALCIUM 80 MG PO TABS: 80.0000 mg | ORAL_TABLET | Freq: Every day | ORAL | 1 refills | Status: DC

## 2020-11-02 MED ORDER — FLUTICASONE PROPIONATE 50 MCG/ACT NA SUSP: 2.0000 | Freq: Every day | NASAL | 2 refills | Status: DC

## 2020-11-02 MED ORDER — FUROSEMIDE 20 MG PO TABS: 20.0000 mg | ORAL_TABLET | Freq: Two times a day (BID) | ORAL | 1 refills | Status: DC

## 2020-11-02 NOTE — Assessment & Plan Note (Signed)
Most recent echo in 10/2018 showed EF 60-65%. Physical exam remarkable for bilateral LE edema, 2+ pitting, with some tenderness. No crackles, chest pain, shortness of breath; orthopnea is unchanged.  - Increase Lasix to 20 mg twice daily - F/u in 1 week for BMP, BP recheck

## 2020-11-02 NOTE — Patient Instructions (Signed)
Thank you for visiting the Internal Medicine Clinic today. It was a pleasure to meet you! Today we discussed your leg swelling and pain with this swelling. I would like to adjust your medications to see if we can help get that swelling down. I also think it would be a great idea to buy some compression socks to wear each day.  Medication changes: STOP amlodipine 10 mg daily STOP hydrochlorothiazide25 mg daily INCREASE your lasix medication to 40 mg daily by taking one 20 mg pill in the morning and one 20 mg pill in the evening  Follow-up: In 1 week to check your kidney function and blood pressure  Remember: If you have any questions or concerns, please call our clinic at [phone] between 9am-5pm and after hours call [phone] and ask for the internal medicine resident on call. If you feel you are having a medical emergency please call 911.  Farrel Gordon, DO

## 2020-11-02 NOTE — Progress Notes (Signed)
   CC: leg swelling with pain  HPI:  Victoria Campos is a 70 y.o. female with past medical history as listed below. Please see problem based charting for detailed assessment and plan.   Past Medical History:  Diagnosis Date   ANXIETY 03/10/2004   Qualifier: Diagnosis of  By: Radene Ou MD, [place]     Heart murmur    Hypercholesteremia    Hypertension    Insomnia    Pulmonary embolism ([place]) 09/23/2011   Acute segmental bilateral segmental PE, neg dopplers, neg coag w/u    Review of Systems:  Review of Systems  Constitutional:  Negative for weight loss.  Respiratory:  Negative for cough and shortness of breath.   Cardiovascular:  Positive for orthopnea and leg swelling. Negative for chest pain.  Musculoskeletal:  Positive for joint pain.    Physical Exam:  Vitals:   11/02/20 1352  BP: 129/67  Pulse: 66  Temp: 97.6 F (36.4 C)  TempSrc: Oral  SpO2: 100%  Weight: 184 lb 11.2 oz (83.8 kg)  Height: '5\' 4"'$  (1.626 m)   Physical Exam Constitutional:      Appearance: Normal appearance.  Cardiovascular:     Rate and Rhythm: Normal rate and regular rhythm.     Pulses:          Radial pulses are 2+ on the right side and 2+ on the left side.       Dorsalis pedis pulses are 2+ on the right side and 2+ on the left side.       Posterior tibial pulses are 2+ on the right side and 2+ on the left side.     Heart sounds: Murmur heard.  Systolic murmur is present.  Pulmonary:     Effort: Pulmonary effort is normal.     Breath sounds: Normal breath sounds and air entry.  Musculoskeletal:     Right lower leg: Tenderness present. 2+ Pitting Edema (to knee) present.     Left lower leg: Tenderness present. 2+ Pitting Edema (to knee) present.     Right ankle: Tenderness present.     Left ankle: Tenderness present.     Right foot: Tenderness present.     Left foot: Tenderness present.  Skin:    General: Skin is warm and dry.  Neurological:     General: No focal deficit present.      Mental Status: She is alert and oriented to person, place, and time.     Assessment & Plan:   See Encounters Tab for problem based charting.  Patient seen with Dr. Dareen Piano

## 2020-11-02 NOTE — Assessment & Plan Note (Signed)
Ear irrigation completed

## 2020-11-02 NOTE — Telephone Encounter (Signed)
I just saw her, I reordered her medications prior to signing encounter just now.

## 2020-11-02 NOTE — Assessment & Plan Note (Signed)
BP at goal today at 129/67. Due to worsening bilateral LE edema, will increase Lasix to 20 mg twice daily, discontinue HCTZ, and discontinue amlodipine as it does have a side effect of dose dependent edema. - Follow up in 1 week to recheck BMP, BP

## 2020-11-02 NOTE — Assessment & Plan Note (Signed)
Flonase renewed.

## 2020-11-03 ENCOUNTER — Encounter: Payer: Medicare HMO | Admitting: Internal Medicine

## 2020-11-07 NOTE — Addendum Note (Signed)
Addended by: Renato Battles on: 11/07/2020 11:16 AM   Modules accepted: Level of Service

## 2020-11-07 NOTE — Progress Notes (Signed)
Internal Medicine Clinic Attending  I saw and evaluated the patient.  I personally confirmed the key portions of the history and exam documented by Dr.  Dean  and I reviewed pertinent patient test results.  The assessment, diagnosis, and plan were formulated together and I agree with the documentation in the resident's note.  

## 2020-11-09 ENCOUNTER — Encounter: Payer: Self-pay | Admitting: Internal Medicine

## 2020-11-09 ENCOUNTER — Ambulatory Visit (INDEPENDENT_AMBULATORY_CARE_PROVIDER_SITE_OTHER): Payer: Medicare HMO | Admitting: Internal Medicine

## 2020-11-09 ENCOUNTER — Other Ambulatory Visit: Payer: Self-pay

## 2020-11-09 VITALS — BP 142/69 | HR 58 | Temp 98.1°F | Ht 64.5 in | Wt 175.9 lb

## 2020-11-09 DIAGNOSIS — I5032 Chronic diastolic (congestive) heart failure: Secondary | ICD-10-CM | POA: Diagnosis not present

## 2020-11-09 DIAGNOSIS — I10 Essential (primary) hypertension: Secondary | ICD-10-CM

## 2020-11-09 MED ORDER — HYDROCHLOROTHIAZIDE 25 MG PO TABS: 25.0000 mg | ORAL_TABLET | Freq: Every day | ORAL | 2 refills | Status: DC

## 2020-11-09 MED ORDER — FUROSEMIDE 20 MG PO TABS: 20.0000 mg | ORAL_TABLET | Freq: Every day | ORAL | 1 refills | Status: DC

## 2020-11-09 NOTE — Progress Notes (Signed)
   CC: HFpEF and Essential Hypertension  HPI:[name] is a 70 y.o. female who presents for evaluation of HFpEF and essential hypertension. Please see individual problem based A/P for details.   Past Medical History:  Diagnosis Date   ANXIETY 03/10/2004   Qualifier: Diagnosis of  By: Radene Ou MD, [place]     Heart murmur    Hypercholesteremia    Hypertension    Insomnia    Pulmonary embolism ([place]) 09/23/2011   Acute segmental bilateral segmental PE, neg dopplers, neg coag w/u    Review of Systems:   Review of Systems  Respiratory:  Negative for cough and shortness of breath.   Cardiovascular:  Negative for chest pain, leg swelling and PND.    Physical Exam: Vitals:   11/09/20 1111  BP: (!) 142/69  Pulse: (!) 58  Temp: 98.1 F (36.7 C)  TempSrc: Oral  SpO2: 98%  Weight: 175 lb 14.4 oz (79.8 kg)  Height: 5' 4.5" (1.638 m)   General: NAD, well kept HEENT: Conjunctiva nl , antiicteric sclerae, moist mucous membranes Cardiovascular: Normal rate, regular rhythm.  Systolic murmur, heard best at RUSB , No JVD Pulmonary : Equal breath sounds, No wheezes, rales, or rhonchi Abdominal: soft, nontender,  bowel sounds present Ext: Trace edema in ankles and feet, no deformities   Assessment & Plan:   See Encounters Tab for problem based charting.  Patient discussed with Dr. Evette Doffing

## 2020-11-09 NOTE — Patient Instructions (Signed)
Thank you for trusting me with your care. To recap, today we discussed the following:   Blood pressure BP: (!) 142/69   Labs - BMP8+Anion Gap  Change to medication: - furosemide (LASIX) 20 MG tablet; Take 1 tablet (20 mg total) by mouth daily.  Dispense: 90 tablet; Refill: 1 - hydrochlorothiazide (HYDRODIURIL) 25 MG tablet; Take 1 tablet (25 mg total) by mouth daily.  Dispense: 90 tablet; Refill: 2

## 2020-11-10 ENCOUNTER — Encounter: Payer: Self-pay | Admitting: Internal Medicine

## 2020-11-10 LAB — BMP8+ANION GAP
Anion Gap: 19 mmol/L — ABNORMAL HIGH (ref 10.0–18.0)
BUN/Creatinine Ratio: 17 (ref 12–28)
BUN: 10 mg/dL (ref 8–27)
CO2: 22 mmol/L (ref 20–29)
Calcium: 9.1 mg/dL (ref 8.7–10.3)
Chloride: 102 mmol/L (ref 96–106)
Creatinine, Ser: 0.6 mg/dL (ref 0.57–1.00)
Glucose: 97 mg/dL (ref 65–99)
Potassium: 3.5 mmol/L (ref 3.5–5.2)
Sodium: 143 mmol/L (ref 134–144)
eGFR: 96 mL/min/{1.73_m2} (ref >59)

## 2020-11-10 NOTE — Assessment & Plan Note (Signed)
HPI: Patient's BP today is 142/69 with a goal of <140/80. Recently amlodipine was stopped because patient is having lower extremity edema. HCTZ also stopped. Patient is also prescribed metoprolol succinate.    Assessment/Plan: HTN, uncontrolled Continue metoprolol succinate Start HCTZ 25 mg daily Follow up in 6 weeks

## 2020-11-10 NOTE — Assessment & Plan Note (Signed)
Patient her for follow up after taking Lasix 20 mg twice daily for one week. She is down 9 lbs and lower only trace edema in ankles and feet. Patient will start taking 20 mg daily today. We discussed weighing herself at home and calling the clinic if she gains 3 lbs in a day or 5 lbs in a week.

## 2020-11-11 NOTE — Progress Notes (Signed)
Internal Medicine Clinic Attending  Case discussed with Dr. Steen  At the time of the visit.  We reviewed the resident's history and exam and pertinent patient test results.  I agree with the assessment, diagnosis, and plan of care documented in the resident's note.  

## 2020-12-01 ENCOUNTER — Other Ambulatory Visit: Payer: Self-pay | Admitting: Internal Medicine

## 2021-01-11 ENCOUNTER — Other Ambulatory Visit: Payer: Self-pay | Admitting: Internal Medicine

## 2021-02-14 ENCOUNTER — Ambulatory Visit (INDEPENDENT_AMBULATORY_CARE_PROVIDER_SITE_OTHER): Payer: Medicare HMO | Admitting: Internal Medicine

## 2021-02-14 VITALS — BP 157/76 | HR 60 | Temp 98.4°F | Wt 189.2 lb

## 2021-02-14 DIAGNOSIS — Z23 Encounter for immunization: Secondary | ICD-10-CM

## 2021-02-14 DIAGNOSIS — I110 Hypertensive heart disease with heart failure: Secondary | ICD-10-CM | POA: Diagnosis not present

## 2021-02-14 DIAGNOSIS — I10 Essential (primary) hypertension: Secondary | ICD-10-CM

## 2021-02-14 DIAGNOSIS — I5032 Chronic diastolic (congestive) heart failure: Secondary | ICD-10-CM | POA: Diagnosis not present

## 2021-02-14 DIAGNOSIS — E7800 Pure hypercholesterolemia, unspecified: Secondary | ICD-10-CM

## 2021-02-14 MED ORDER — LOSARTAN POTASSIUM 25 MG PO TABS: 25.0000 mg | ORAL_TABLET | Freq: Every day | ORAL | 11 refills | Status: DC

## 2021-02-14 NOTE — Assessment & Plan Note (Addendum)
Assessment: Patient's blood pressure remains above goal on initial triage reading as well as recheck, 159/82>157/76. She states that when she takes measurements at home, her systolic reading ranges in the 130s-140s but is in the 140s most often. She does reveal that prior to arriving for her visit this morning she received a call informing her that her nephew had passed and that she feels her blood pressure is abnormally elevated as a result. Plan: Patient has not tolerated amlodipine historically because of lower extremity edema. I will check a BMP at this time and start losartan 25 mg daily. She will continue remainder of her regimen including HCTZ 25 mg daily, Lasix 20 mg daily, and metoprolol 50 mg daily. I have asked her to keep a log of her home blood pressure readings. She will return to clinic in 4 weeks for a recheck of her blood pressure and will bring her home log to that visit.

## 2021-02-14 NOTE — Assessment & Plan Note (Addendum)
Assessment: Patient has not had a lipid panel checked since June 2019. Plan: Check lipid panel today. Will adjust medication regimen at follow-up visit in 4 weeks if needed based on these results. Continue atorvastatin 80 mg daily for now.  Addendum: LDL returned at 123, cholesterol 202.  Patient is currently taking atorvastatin 80 mg daily and reports that she does indeed take it every day.  I will send in Zetia 10 mg daily for her to take in addition to atorvastatin.

## 2021-02-14 NOTE — Patient Instructions (Addendum)
Thank you for visiting the Internal Medicine Clinic today. It was a pleasure to see you again! Today we discussed your blood pressure control.   Your blood pressure is still running higher than our goal of <140/90, both at home and most of the time when you check it at home. I would like to make some changes to your blood pressure medication to help [place] achieve this goal.   Medication changes: Please start losartan 25 mg daily. You will continue to take your other medications as you are now.  Labs ordered: BMP: To check your electrolytes and kidneys Lipid panel: To check your cholesterol.  Vaccines given: Flu vaccine  Follow-up: In 1 month to check your blood pressure. Please keep a log of your home blood pressure readings and bring that to your follow-up visit.  Remember: If you have any questions or concerns, please call our clinic at [phone] between 9am-5pm and after hours call [phone] and ask for the internal medicine resident on call. If you feel you are having a medical emergency please call 911.  Farrel Gordon, DO

## 2021-02-14 NOTE — Assessment & Plan Note (Signed)
Assessment: Patient reports marked improvement in lower extremity edema since discontinuing amlodipine. She does still get dependent edema after being on her feet for extended periods of time but this dissipates with elevation of the feet. She denies weight changes, trouble breathing. She is tolerating Lasix well. Plan: Continue Lasix 20 mg daily.

## 2021-02-14 NOTE — Progress Notes (Signed)
   CC: blood pressure check  HPI:  [name] is a 70 y.o. female with past medical history as detailed below who presents for check-up of her hypertension. Please see problem-based charting for detailed assessment and plan.  Past Medical History:  Diagnosis Date   ANXIETY 03/10/2004   Qualifier: Diagnosis of  By: Radene Ou MD, [place]     Heart murmur    Hypercholesteremia    Hypertension    Insomnia    Pulmonary embolism ([place]) 09/23/2011   Acute segmental bilateral segmental PE, neg dopplers, neg coag w/u    Review of Systems:  Review of Systems  Constitutional:  Negative for weight loss.  Respiratory:  Negative for shortness of breath.   Cardiovascular:  Negative for chest pain and leg swelling.  Neurological:  Negative for loss of consciousness, weakness and headaches.    Physical Exam:  Vitals:   02/14/21 1027  BP: (!) 159/82  Pulse: 61  SpO2: 95%  Weight: 189 lb 3.2 oz (85.8 kg)   Constitutional: Well appearing woman, no acute distress noted. Cardio: Regular rate and rhythm. Systolic murmur present. Pulm: Clear to auscultation bilaterally. Abdomen: Soft, non-tender, non-distended. MSK: Negative for upper or lower extremity edema. Skin: Warm and dry. Neuro: Alert and oriented x3. No focal deficit noted. Psych: Normal mood and affect.  Assessment & Plan:   See Encounters Tab for problem based charting.  Patient seen with Dr.  Saverio Danker

## 2021-02-15 LAB — LIPID PANEL
Chol/HDL Ratio: 3.1 ratio (ref 0.0–4.4)
Cholesterol, Total: 202 mg/dL — ABNORMAL HIGH (ref 100–199)
HDL: 66 mg/dL (ref >39)
LDL Chol Calc (NIH): 123 mg/dL — ABNORMAL HIGH (ref 0–99)
Triglycerides: 70 mg/dL (ref 0–149)
VLDL Cholesterol Cal: 13 mg/dL (ref 5–40)

## 2021-02-15 LAB — BMP8+ANION GAP
Anion Gap: 18 mmol/L (ref 10.0–18.0)
BUN/Creatinine Ratio: 14 (ref 12–28)
BUN: 10 mg/dL (ref 8–27)
CO2: 23 mmol/L (ref 20–29)
Calcium: 9.6 mg/dL (ref 8.7–10.3)
Chloride: 101 mmol/L (ref 96–106)
Creatinine, Ser: 0.74 mg/dL (ref 0.57–1.00)
Glucose: 105 mg/dL — ABNORMAL HIGH (ref 70–99)
Potassium: 3.8 mmol/L (ref 3.5–5.2)
Sodium: 142 mmol/L (ref 134–144)
eGFR: 86 mL/min/{1.73_m2} (ref >59)

## 2021-02-16 NOTE — Progress Notes (Signed)
Internal Medicine Clinic Attending  I saw and evaluated the patient.  I personally confirmed the key portions of the history and exam documented by Dr.  Dean  and I reviewed pertinent patient test results.  The assessment, diagnosis, and plan were formulated together and I agree with the documentation in the resident's note.  

## 2021-02-17 ENCOUNTER — Telehealth: Payer: Self-pay | Admitting: Internal Medicine

## 2021-02-17 MED ORDER — EZETIMIBE 10 MG PO TABS: 10.0000 mg | ORAL_TABLET | Freq: Every day | ORAL | 11 refills | Status: DC

## 2021-02-17 NOTE — Telephone Encounter (Signed)
Spoke with patient to inform her of the results of her lab tests at her recent visit.  She did confirm that she takes atorvastatin 80 mg daily as prescribed.  I informed her that her cholesterol was elevated on these results and that I am going to send in an additional medication, Zetia 10 mg for her to take once daily in addition to the atorvastatin.  She voiced understanding and will pick this medication up from the [facility] on [address].

## 2021-02-17 NOTE — Addendum Note (Signed)
Addended by: Renato Battles on: 02/17/2021 10:12 AM   Modules accepted: Orders

## 2021-02-28 ENCOUNTER — Other Ambulatory Visit: Payer: Medicare HMO

## 2021-03-17 ENCOUNTER — Encounter: Payer: Medicare HMO | Admitting: Internal Medicine

## 2021-03-24 ENCOUNTER — Ambulatory Visit (INDEPENDENT_AMBULATORY_CARE_PROVIDER_SITE_OTHER): Payer: Medicare HMO | Admitting: Internal Medicine

## 2021-03-24 VITALS — BP 152/62 | HR 56 | Temp 98.4°F | Wt 192.7 lb

## 2021-03-24 DIAGNOSIS — I10 Essential (primary) hypertension: Secondary | ICD-10-CM | POA: Diagnosis not present

## 2021-03-24 DIAGNOSIS — H6123 Impacted cerumen, bilateral: Secondary | ICD-10-CM

## 2021-03-24 MED ORDER — LOSARTAN POTASSIUM 50 MG PO TABS: 50.0000 mg | ORAL_TABLET | Freq: Every day | ORAL | 0 refills | Status: DC

## 2021-03-24 NOTE — Patient Instructions (Signed)
Hypertension -Increase losartan dose to 50mg  (two 25mg  tablets) -Return in 2 weeks for a blood pressure recheck -Please bring your blood pressure log to that visit

## 2021-03-25 LAB — BASIC METABOLIC PANEL
BUN/Creatinine Ratio: 23 (ref 12–28)
BUN: 15 mg/dL (ref 8–27)
CO2: 22 mmol/L (ref 20–29)
Calcium: 9.8 mg/dL (ref 8.7–10.3)
Chloride: 103 mmol/L (ref 96–106)
Creatinine, Ser: 0.66 mg/dL (ref 0.57–1.00)
Glucose: 95 mg/dL (ref 70–99)
Potassium: 4 mmol/L (ref 3.5–5.2)
Sodium: 141 mmol/L (ref 134–144)
eGFR: 93 mL/min/{1.73_m2} (ref >59)

## 2021-03-26 ENCOUNTER — Encounter: Payer: Self-pay | Admitting: Internal Medicine

## 2021-03-26 NOTE — Assessment & Plan Note (Addendum)
4 week blood pressure follow up Blood pressure remains above goal in the office today and essentially unchanged from [place] in December. She did not bring her blood pressure log with to her visit today but recalls that her home blood pressures have been running in the 244W-102V systolically. She reports medication adherence although, if she started it on the day that she picked it up, on 12/6, she should have ran out by now. When I asked if she needed refills, she said no, which would suggest a component of non-adherence. Electrolytes/kidney function are unremarkable on today's labs.  Since she says that she is taking the medication and her blood pressure is still elevated, will increase losartan to 50mg   Continue hctz, lasix and metoprolol  Follow up in 2 weeks for BP recheck. She will have definitely had to run out of her medications by that time so if she has not picked up the new prescription by then, we will need to address adherence at that time. She has been reminded to bring her blood pressure log to this visit.

## 2021-03-26 NOTE — Assessment & Plan Note (Signed)
Ears irrigated again today. She did have some very mild abrasions in her right external canal. She told me she has been trying to dig out the wax with her fingernail. I instructed her to stop doing this.

## 2021-03-26 NOTE — Progress Notes (Signed)
° °  Office Visit   Patient ID: Victoria Campos, female    DOB: Jun 23, 1950, 71 y.o.   MRN: [id number]   PCP: Farrel Gordon, DO   Subjective:   Victoria Campos is a 71 y.o. year old female who presents for 1 month blood pressure recheck after initiation of losartan at her LOV with Dr. Marlou Sa in December. She additionally notes that she has had difficulty hearing and has been trying to get wax out of her ears without success.    Objective:   BP (!) 152/62 (BP Location: Right Arm, Cuff Size: Normal)    Pulse (!) 56    Temp 98.4 F (36.9 C) (Oral)    Wt 192 lb 11.2 oz (87.4 kg)    SpO2 99%    BMI 32.57 kg/m   General: well  appearing no distress Ears: cerumen impaction in the bilateral canals. Mild abrasions present in the external canal on the right.  Cardiac: RRR, no LE edema Pulm: lungs clear throughout Assessment & Plan:   Problem List Items Addressed This Visit       Cardiovascular and Mediastinum   Essential hypertension - Primary (Chronic)    4 week blood pressure follow up Blood pressure remains above goal in the office today and essentially unchanged from [place] in December. She did not bring her blood pressure log with to her visit today but recalls that her home blood pressures have been running in the 478G-956O systolically. She reports medication adherence although, if she started it on the day that she picked it up, on 12/6, she should have ran out by now. When I asked if she needed refills, she said no, which would suggest a component of non-adherence. Electrolytes/kidney function are unremarkable on today's labs. Since she says that she is taking the medication and her blood pressure is still elevated, will increase losartan to 50mg  Continue hctz, lasix and metoprolol Follow up in 2 weeks for BP recheck. She will have definitely had to run out of her medications by that time so if she has not picked up the new prescription by then, we will need to address adherence at that time. She  has been reminded to bring her blood pressure log to this visit.      Relevant Medications   losartan (COZAAR) 50 MG tablet   Other Relevant Orders   Basic metabolic panel (Completed)     Nervous and Auditory   Cerumen impaction (Chronic)    Ears irrigated again today. She did have some very mild abrasions in her right external canal. She told me she has been trying to dig out the wax with her fingernail. I instructed her to stop doing this.        Return in about 2 weeks (around 04/07/2021).   Pt discussed with Dr. Forde Radon, MD Internal Medicine Resident PGY-3 [facility] 03/26/2021 8:23 AM

## 2021-03-27 ENCOUNTER — Encounter: Payer: Self-pay | Admitting: Internal Medicine

## 2021-03-29 NOTE — Progress Notes (Signed)
Internal Medicine Clinic Attending  Case discussed with Dr. Christian  At the time of the visit.  We reviewed the resident's history and exam and pertinent patient test results.  I agree with the assessment, diagnosis, and plan of care documented in the resident's note.  

## 2021-04-07 ENCOUNTER — Encounter: Payer: Medicare HMO | Admitting: Internal Medicine

## 2021-04-14 ENCOUNTER — Ambulatory Visit (INDEPENDENT_AMBULATORY_CARE_PROVIDER_SITE_OTHER): Payer: Medicare HMO | Admitting: Internal Medicine

## 2021-04-14 ENCOUNTER — Other Ambulatory Visit: Payer: Self-pay

## 2021-04-14 ENCOUNTER — Encounter: Payer: Self-pay | Admitting: Internal Medicine

## 2021-04-14 VITALS — BP 148/71 | HR 65 | Temp 98.3°F | Ht 64.0 in | Wt 188.5 lb

## 2021-04-14 DIAGNOSIS — H9190 Unspecified hearing loss, unspecified ear: Secondary | ICD-10-CM | POA: Diagnosis not present

## 2021-04-14 DIAGNOSIS — I110 Hypertensive heart disease with heart failure: Secondary | ICD-10-CM | POA: Diagnosis not present

## 2021-04-14 DIAGNOSIS — I5032 Chronic diastolic (congestive) heart failure: Secondary | ICD-10-CM

## 2021-04-14 DIAGNOSIS — H905 Unspecified sensorineural hearing loss: Secondary | ICD-10-CM | POA: Diagnosis not present

## 2021-04-14 DIAGNOSIS — G4700 Insomnia, unspecified: Secondary | ICD-10-CM | POA: Diagnosis not present

## 2021-04-14 DIAGNOSIS — R5383 Other fatigue: Secondary | ICD-10-CM | POA: Insufficient documentation

## 2021-04-14 DIAGNOSIS — I10 Essential (primary) hypertension: Secondary | ICD-10-CM

## 2021-04-14 MED ORDER — FUROSEMIDE 20 MG PO TABS: 20.0000 mg | ORAL_TABLET | Freq: Every day | ORAL | 1 refills | Status: DC

## 2021-04-14 MED ORDER — LOSARTAN POTASSIUM 100 MG PO TABS: 100.0000 mg | ORAL_TABLET | Freq: Every day | ORAL | 11 refills | Status: DC

## 2021-04-14 NOTE — Assessment & Plan Note (Signed)
The patient states that she is worried about hearing loss because she has older family members that have lost her hearing.  Also endorses tinnitus that happens all the time.   Plan: Referral to audiology for formal hearing evaluation per patient request

## 2021-04-14 NOTE — Assessment & Plan Note (Signed)
The patient endorses difficulty sleeping that has been going on for a while.  She specifically says that she has difficulty falling asleep.  Falls asleep around 2:30 AM and wakes up at 5:30 AM.  She is usually in bed around 9 PM but has racing thoughts and is unable to fall asleep until early the next morning.  Does not drink caffeine at night.  She says she has a desk in her room which she uses to do work and does not do any work while in bed.  Patient states that this seemed to have started when her son was killed several years ago.  Endorses some anxiety, but denies depressive symptoms or HI/SI.   Plan: GAD-7 of 5 today.  Discussed sleep hygiene and discussed that we will address this further at her next visit in 2 weeks.

## 2021-04-14 NOTE — Progress Notes (Signed)
° °  CC: blood pressure check  HPI:  [name] is a 71 y.o. with past medical history as noted below who presents to the clinic today for a blood pressure check. Please see problem-based list for further details, assessments, and plans.  Past Medical History:  Diagnosis Date   ANXIETY 03/10/2004   Qualifier: Diagnosis of  By: Radene Ou MD, [place]     Heart murmur    Hypercholesteremia    Hypertension    Insomnia    Pulmonary embolism ([place]) 09/23/2011   Acute segmental bilateral segmental PE, neg dopplers, neg coag w/u    Review of Systems: Negative aside from that listed in individualized problem based charting.  Physical Exam:  Vitals:   04/14/21 0949  BP: (!) 148/71  Pulse: 65  Temp: 98.3 F (36.8 C)  TempSrc: Oral  SpO2: 98%  Weight: 188 lb 8 oz (85.5 kg)  Height: 5\' 4"  (1.626 m)   General: NAD, nl appearance HE: Normocephalic, atraumatic, EOMI, Conjunctivae normal ENT: No congestion, no rhinorrhea, no exudate or erythema.  Tympanic membranes are normal-appearing bilaterally. Cardiovascular: Normal rate, regular rhythm. No murmurs, rubs, or gallops Pulmonary: Effort normal, breath sounds normal. No wheezes, rales, or rhonchi Abdominal: soft, nontender, bowel sounds present Musculoskeletal: no swelling, deformity, injury or tenderness in extremities Skin: Warm, dry, no bruising, erythema, or rash Psychiatric/Behavioral: normal mood, normal behavior     Assessment & Plan:   See Encounters Tab for problem based charting.  Patient discussed with Dr. Philipp Ovens

## 2021-04-14 NOTE — Patient Instructions (Signed)
Thank you, Ms.La E Hebdon for allowing [place] to provide your care today. Today we discussed your blood pressure, concern for hearing loss, and possible anxiety/insomnia.  Blood pressure: We checked this twice, and it looks like it is still high today. I am increasing your losartan to 100 mg daily today. We will see you in 2 weeks for a blood pressure check.  Hearing: Due to your concern for hearing loss and for your ringing in your ears, I referred you to audiology for a hearing evaluation.  Insomnia: We will address this at your next visit.  I have ordered the following labs for you:  Lab Orders         CBC no Diff         TSH       Tests ordered today:  Sleep study - to see if this could be causing your fatigue  Referrals ordered today:   Referral Orders         Ambulatory referral to Audiology       I have ordered the following medication/changed the following medications:   Stop the following medications: Medications Discontinued During This Encounter  Medication Reason   losartan (COZAAR) 50 MG tablet      Start the following medications: Meds ordered this encounter  Medications   losartan (COZAAR) 100 MG tablet    Sig: Take 1 tablet (100 mg total) by mouth daily.    Dispense:  30 tablet    Refill:  11     Follow up:  2 weeks     Should you have any questions or concerns please call the internal medicine clinic at [phone].

## 2021-04-14 NOTE — Assessment & Plan Note (Addendum)
BP Readings from Last 3 Encounters:  04/14/21 (!) 148/71  03/24/21 (!) 152/62  02/14/21 (!) 157/76   The patient is here today for a 2 week BP check. She states she has been measuring her BP's at home and that they have typically been running in the 409O-502H systolic. States that she has been taking metoprolol, HCTZ, and losartan for her BP. Endorses compliance with these medications. She has not been taking Lasix recently.  States she forgot to bring her blood pressure log today.  Denies any associated symptoms.  Plan: Will increase losartan to 100 mg daily at this time.  Continue Lasix, metoprolol, and HCTZ.  Also discussed that OSA could be contributing to higher blood pressures, therefore have referred patient for sleep study today.  Follow-up in 2 weeks for blood pressure check.

## 2021-04-14 NOTE — Assessment & Plan Note (Signed)
Victoria Campos is a history of daytime sleepiness/fatigue.  Endorses family history of thyroid problems (sister).  She describes waking up tired and having low energy levels.  Her appetite is normal.  Denies depressive symptoms.  She endorses difficulty falling asleep and only gets about 3 hours of sleep at night.  She does snore when she is tired.  Plan: Ordered sleep study as discussed previously.  Will obtain TSH and CBC for further evaluation as well.

## 2021-04-15 LAB — CBC
Hematocrit: 38.2 % (ref 34.0–46.6)
Hemoglobin: 12.7 g/dL (ref 11.1–15.9)
MCH: 29.5 pg (ref 26.6–33.0)
MCHC: 33.2 g/dL (ref 31.5–35.7)
MCV: 89 fL (ref 79–97)
Platelets: 244 10*3/uL (ref 150–450)
RBC: 4.3 x10E6/uL (ref 3.77–5.28)
RDW: 12.9 % (ref 11.7–15.4)
WBC: 3.4 10*3/uL (ref 3.4–10.8)

## 2021-04-15 LAB — TSH: TSH: 1.2 u[IU]/mL (ref 0.450–4.500)

## 2021-04-17 NOTE — Addendum Note (Signed)
Addended by: Jodean Lima on: 04/17/2021 10:37 AM   Modules accepted: Level of Service

## 2021-04-17 NOTE — Progress Notes (Signed)
Internal Medicine Clinic Attending ° °Case discussed with Dr. Bonanno  °  At the time of the visit.  We reviewed the resident’s history and exam and pertinent patient test results.  I agree with the assessment, diagnosis, and plan of care documented in the resident’s note. ° °

## 2021-04-20 DIAGNOSIS — H5200 Hypermetropia, unspecified eye: Secondary | ICD-10-CM | POA: Diagnosis not present

## 2021-04-20 DIAGNOSIS — Z0100 Encounter for examination of eyes and vision without abnormal findings: Secondary | ICD-10-CM | POA: Diagnosis not present

## 2021-04-28 ENCOUNTER — Encounter: Payer: Medicare HMO | Admitting: Student

## 2021-05-01 ENCOUNTER — Encounter: Payer: Self-pay | Admitting: Student

## 2021-08-15 ENCOUNTER — Ambulatory Visit (INDEPENDENT_AMBULATORY_CARE_PROVIDER_SITE_OTHER): Payer: Medicare HMO | Admitting: Internal Medicine

## 2021-08-15 DIAGNOSIS — I10 Essential (primary) hypertension: Secondary | ICD-10-CM | POA: Diagnosis not present

## 2021-08-15 DIAGNOSIS — Z0000 Encounter for general adult medical examination without abnormal findings: Secondary | ICD-10-CM

## 2021-08-15 DIAGNOSIS — Z87891 Personal history of nicotine dependence: Secondary | ICD-10-CM | POA: Diagnosis not present

## 2021-08-15 NOTE — Progress Notes (Signed)
  [facility] Internal Medicine Res[facility] Telephone Encounter Continuity Care Appointment  HPI:  This telephone encounter was created for Ms. [name] on 08/15/2021 for the following purpose/cc fill out [facility] paperwork.    Past Medical History:  Past Medical History:  Diagnosis Date   ANXIETY 03/10/2004   Qualifier: Diagnosis of  By: Radene Ou MD, [place]     Heart murmur    Hypercholesteremia    Hypertension    Insomnia    Pulmonary embolism ([place]) 09/23/2011   Acute segmental bilateral segmental PE, neg dopplers, neg coag w/u      ROS:  Negative aside from that listed in individualized problem based charting.    Assessment / Plan / Recommendations:  Please see A&P under problem oriented charting for assessment of the patient's acute and chronic medical conditions.  As always, pt is advised that if symptoms worsen or new symptoms arise, they should go to an urgent care facility or to to ER for further evaluation.   Consent and Medical Decision Making:  Patient discussed with Dr. Evette Doffing This is a telephone encounter between [name] and Orvis Brill on 08/15/2021 for [facility] paperwork. The visit was conducted with the patient located at home and Orvis Brill at [facility]. The patient's identity was confirmed using their DOB and current address. The patient has consented to being evaluated through a telephone encounter and understands the associated risks (an examination cannot be done and the patient may need to come in for an appointment) / benefits (allows the patient to remain at home, decreasing exposure to coronavirus). I personally spent 8 minutes on medical discussion.

## 2021-08-15 NOTE — Assessment & Plan Note (Addendum)
Patient was seen for telehealth visit to fill out DMV paperwork. She was last seen in office in February, at which time the plan was for her to return in 2 weeks for a BP check.  -Pt was advised to return to schedule a follow-up for a BP check

## 2021-08-15 NOTE — Assessment & Plan Note (Signed)
-  DMV paperwork filled out over the phone with patient. Patient endorses no changes in the interim.

## 2021-08-16 NOTE — Progress Notes (Signed)
Internal Medicine Clinic Attending  Case discussed with Dr. Lorin Glass  At the time of the visit.  We reviewed the resident's history and pertinent patient test results.  I agree with the assessment, diagnosis, and plan of care documented in the resident's note.

## 2021-09-05 ENCOUNTER — Other Ambulatory Visit: Payer: Self-pay | Admitting: Internal Medicine

## 2021-09-05 MED ORDER — DICLOFENAC SODIUM 1 % EX GEL: 4.0000 g | Freq: Four times a day (QID) | CUTANEOUS | 1 refills | Status: DC

## 2021-09-05 NOTE — Telephone Encounter (Signed)
diclofenac Sodium (VOLTAREN) 1 % GEL, refill request @ [facility] on [address].

## 2021-09-06 MED ORDER — DICLOFENAC SODIUM 1 % EX GEL: 4.0000 g | Freq: Four times a day (QID) | CUTANEOUS | 1 refills | Status: DC

## 2021-09-13 ENCOUNTER — Other Ambulatory Visit: Payer: Self-pay | Admitting: Internal Medicine

## 2021-09-13 DIAGNOSIS — I5032 Chronic diastolic (congestive) heart failure: Secondary | ICD-10-CM

## 2021-09-27 ENCOUNTER — Other Ambulatory Visit: Payer: Self-pay | Admitting: Internal Medicine

## 2021-09-27 DIAGNOSIS — M47895 Other spondylosis, thoracolumbar region: Secondary | ICD-10-CM

## 2021-09-27 DIAGNOSIS — I10 Essential (primary) hypertension: Secondary | ICD-10-CM

## 2021-09-27 DIAGNOSIS — M170 Bilateral primary osteoarthritis of knee: Secondary | ICD-10-CM

## 2021-10-07 ENCOUNTER — Ambulatory Visit (HOSPITAL_COMMUNITY)
Admission: EM | Admit: 2021-10-07 | Discharge: 2021-10-07 | Disposition: A | Discharge status: Home / Self Care | Payer: Medicare HMO | Attending: Physician Assistant | Admitting: Physician Assistant

## 2021-10-07 ENCOUNTER — Ambulatory Visit (INDEPENDENT_AMBULATORY_CARE_PROVIDER_SITE_OTHER): Payer: Medicare HMO

## 2021-10-07 ENCOUNTER — Encounter (HOSPITAL_COMMUNITY): Payer: Self-pay | Admitting: Emergency Medicine

## 2021-10-07 DIAGNOSIS — M1711 Unilateral primary osteoarthritis, right knee: Secondary | ICD-10-CM

## 2021-10-07 DIAGNOSIS — I10 Essential (primary) hypertension: Secondary | ICD-10-CM | POA: Diagnosis not present

## 2021-10-07 DIAGNOSIS — G8929 Other chronic pain: Secondary | ICD-10-CM | POA: Diagnosis not present

## 2021-10-07 DIAGNOSIS — R936 Abnormal findings on diagnostic imaging of limbs: Secondary | ICD-10-CM | POA: Diagnosis not present

## 2021-10-07 DIAGNOSIS — M25561 Pain in right knee: Secondary | ICD-10-CM | POA: Diagnosis not present

## 2021-10-07 MED ORDER — HYDROCODONE-ACETAMINOPHEN 5-325 MG PO TABS: 0.5000 | ORAL_TABLET | Freq: Two times a day (BID) | ORAL | 0 refills | Status: DC | PRN

## 2021-10-07 NOTE — ED Provider Notes (Signed)
[place]    CSN: [id number] Arrival date & time: 10/07/21  1018      History   Chief Complaint Chief Complaint  Patient presents with   Knee Pain    HPI Victoria Campos is a 71 y.o. female.   Patient presents today with a prolonged history of right knee pain that has worsened significantly in the past 1 to 2 days.  She denies any known injury or increase in activity prior to symptom onset.  She does have a history of osteoarthritis in this knee but denies history of rheumatoid arthritis or gout.  She has tried ibuprofen with improvement of symptoms only temporarily.  She is having difficulty bearing weight.  Reports that at rest pain is rated 9.5 on a 0-10 pain scale but increases to 10+ with attempted ambulation, localized to medial inferior right knee without radiation, described as sharp, no alleviating factors identified.  She denies any pain in her calf or lower leg swelling.  She denies any instability, popping, clicking.  Blood pressure is elevated today.  Patient does have a history of hypertension and reports that she monitors her blood pressure at home with yesterday's reading approximately 150/70.  She denies any chest pain, shortness of breath, headache, vision change, dizziness.  She does report increased use of NSAIDs due to pain.  She also reports significant pain which could be impacting her blood pressure.    Past Medical History:  Diagnosis Date   ANXIETY 03/10/2004   Qualifier: Diagnosis of  By: Radene Ou MD, [place]     Heart murmur    Hypercholesteremia    Hypertension    Insomnia    Pulmonary embolism ([place]) 09/23/2011   Acute segmental bilateral segmental PE, neg dopplers, neg coag w/u     Patient Active Problem List   Diagnosis Date Noted   Insomnia 04/14/2021   Fatigue 04/14/2021   Chronic heart failure with preserved ejection fraction ([place]) 09/29/2018   Bilateral lower extremity edema 09/29/2018   Encounter for screening mammogram for  breast cancer 09/29/2018   Epistaxis 01/17/2018   Grief 12/22/2017   Chest pain 09/05/2017   Cerumen impaction 02/16/2015   Essential hypertension 10/15/2014   Osteoarthritis of both knees 10/15/2014   Osteoarthritis of back 01/27/2014   Healthcare maintenance 09/17/2013   Myopia of left eye 03/20/2013   Sensorineural hearing loss 12/27/2009   TINNITUS 03/14/2009   Left shoulder pain 04/20/2008   BENIGN PAROXYSMAL POSITIONAL VERTIGO 10/21/2007   ALLERGIC RHINITIS, SEASONAL 08/24/2004   Hyperlipidemia 04/21/2004   DEPRESSION 03/10/2004    Past Surgical History:  Procedure Laterality Date   arm surgery     torn ligamint   [place]     removed   tubal ligation      OB History   No obstetric history on file.      Home Medications    Prior to Admission medications   Medication Sig Start Date End Date Taking? Authorizing Provider  aspirin 81 MG chewable tablet Chew 81 mg by mouth daily.   Yes [provider]  atorvastatin (LIPITOR) 80 MG tablet TAKE 1 TABLET EVERY DAY 09/28/21  Yes Farrel Gordon, DO  diclofenac Sodium (VOLTAREN) 1 % GEL Apply 4 g topically 4 (four) times daily. 09/06/21  Yes Farrel Gordon, DO  ezetimibe (ZETIA) 10 MG tablet Take 1 tablet (10 mg total) by mouth daily. 02/17/21 02/17/22 Yes Dean, Raquel Sarna, DO  fluticasone (FLONASE) 50 MCG/ACT nasal spray USE 2 SPRAYS IN EACH NOSTRIL EVERY  DAY 09/28/21  Yes Farrel Gordon, DO  furosemide (LASIX) 20 MG tablet TAKE 1 TABLET EVERY DAY 09/14/21  Yes Farrel Gordon, DO  hydrochlorothiazide (HYDRODIURIL) 25 MG tablet Take 1 tablet (25 mg total) by mouth daily. 11/09/20  Yes Madalyn Rob, MD  HYDROcodone-acetaminophen (NORCO/VICODIN) 5-325 MG tablet Take 0.5-1 tablets by mouth 2 (two) times daily as needed. 10/07/21  Yes Levon Boettcher K, PA-C  IBU 600 MG tablet TAKE 1 TABLET EVERY 6 HOURS AS NEEDED 09/28/21  Yes Farrel Gordon, DO  losartan (COZAAR) 100 MG tablet Take 1 tablet (100 mg total) by mouth daily. 04/14/21 04/14/22 Yes Orvis Brill, MD  metoprolol succinate (TOPROL-XL) 50 MG 24 hr tablet TAKE 1 TABLET EVERY DAY  WITH  OR  IMMEDIATELY FOLLOWING A MEAL 11/02/20  Yes Farrel Gordon, DO    Family History Family History  Problem Relation Age of Onset   Heart attack Father        died at age 52   Heart disease Father    Cancer Sister        colon   Cancer Sister        breast   Cancer Sister    Cancer Brother        colon    Social History Social History   Tobacco Use   Smoking status: Former    Types: Cigarettes    Quit date: 03/12/1968    Years since quitting: 53.6   Smokeless tobacco: Never  Substance Use Topics   Alcohol use: Yes    Alcohol/week: 1.0 standard drink of alcohol    Types: 1 Glasses of wine per week    Comment: occasional   Drug use: No     Allergies   Sulfamethoxazole-trimethoprim   Review of Systems Review of Systems  Constitutional:  Positive for activity change. Negative for appetite change, fatigue and fever.  Respiratory:  Negative for cough and shortness of breath.   Cardiovascular:  Negative for chest pain, palpitations and leg swelling.  Gastrointestinal:  Negative for abdominal pain, diarrhea, nausea and vomiting.  Musculoskeletal:  Positive for arthralgias, gait problem and joint swelling. Negative for myalgias.  Neurological:  Negative for dizziness, light-headedness and headaches.     Physical Exam Triage Vital Signs ED Triage Vitals  Enc Vitals Group     BP 10/07/21 1123 (!) 187/68     Pulse Rate 10/07/21 1123 63     Resp 10/07/21 1123 18     Temp 10/07/21 1123 98.2 F (36.8 C)     Temp Source 10/07/21 1123 Oral     SpO2 10/07/21 1123 100 %     Weight 10/07/21 1125 193 lb (87.5 kg)     Height 10/07/21 1125 5' 4.5" (1.638 m)     Head Circumference --      Peak Flow --      Pain Score 10/07/21 1125 9     Pain Loc --      Pain Edu? --      Excl. in [place]? --    No data found.  Updated Vital Signs BP (!) 143/70 (BP Location: Right Arm)   Pulse 67    Temp 98.1 F (36.7 C) (Oral)   Resp 18   Ht 5' 4.5" (1.638 m)   Wt 193 lb (87.5 kg)   SpO2 97%   BMI 32.62 kg/m   Visual Acuity Right Eye Distance:   Left Eye Distance:   Bilateral Distance:    Right Eye Near:  Left Eye Near:    Bilateral Near:     Physical Exam Vitals reviewed.  Constitutional:      General: She is awake. She is not in acute distress.    Appearance: Normal appearance. She is well-developed. She is not ill-appearing.     Comments: Very pleasant female appears stated age in no acute distress sitting comfortably in exam room  HENT:     Head: Normocephalic and atraumatic.  Cardiovascular:     Rate and Rhythm: Normal rate and regular rhythm.     Heart sounds: S1 normal and S2 normal. Murmur heard.     Comments: Negative Bevelyn Buckles' sign on right Pulmonary:     Effort: Pulmonary effort is normal.     Breath sounds: Normal breath sounds. No wheezing, rhonchi or rales.     Comments: Clear to auscultation bilaterally Musculoskeletal:     Right knee: Swelling and effusion present. Decreased range of motion. Tenderness present over the medial joint line. No LCL laxity, MCL laxity, ACL laxity or PCL laxity.     Right lower leg: No edema.     Left lower leg: No edema.     Comments: Right knee: Moderate effusion noted.  Tenderness palpation over medial tibial plateau.  No deformity noted.  No ligamentous laxity on exam.  Psychiatric:        Behavior: Behavior is cooperative.      UC Treatments / Results  Labs (all labs ordered are listed, but only abnormal results are displayed) Labs Reviewed - No data to display  EKG   Radiology DG Knee Complete 4 Views Right  Result Date: 10/07/2021 CLINICAL DATA:  Pain and difficulty bearing weight. Personal history of gout. EXAM: RIGHT KNEE - COMPLETE 4+ VIEW COMPARISON:  Right knee radiographs 10/13/2006 FINDINGS: Progressive degenerative changes are present in the medial and patellar from femoral component.  Fragmentation at the superior patella has progressed. No effusion to suggest acute abnormality. IMPRESSION: 1. Progressive degenerative changes in the medial and patellar compartments. 2. Fragmentation at the superior patella has progressed. Electronically Signed   By: San Morelle M.D.   On: 10/07/2021 12:11    Procedures Procedures (including critical care time)  Medications Ordered in UC Medications - No data to display  Initial Impression / Assessment and Plan / UC Course  I have reviewed the triage vital signs and the nursing notes.  Pertinent labs & imaging results that were available during my care of the patient were reviewed by me and considered in my medical decision making (see chart for details).     X-ray obtained given mechanism of injury which showed worsening degenerative changes without acute osseous abnormality.  Patient was encouraged to use brace for comfort and support which she has at home.  Discussed that ultimately she will need to see an orthopedic provider.  She was given contact information for local provider with instruction to call to schedule an appointment.  She does have Voltaren gel was encouraged to continue sinus medication as prescribed.  We prescribed a few doses of hydrocodone for pain relief.  Discussed that this can be sedating as well as habit-forming and should be used very infrequently.  She is to take 0.5 to 1 tablets up to twice a day as needed.  Discussed that she needs to take it right before going to bed to ensure she does not fall.  Review of [place] controlled substance x-ray shows no inappropriate refills.  Discussed that if she has any worsening symptoms  she needs to be seen immediately.  Strict return precautions given.  Work excuse note provided.  Blood pressure is fairly elevated today.  Suspect this is related to a combination of NSAID use and pain.  She is to monitor this at home.  She denies any signs/symptoms of endorgan  damage.  Discussed that if she develops any chest pain, shortness of breath, headache, vision change, dizziness in setting of high blood pressure she needs to go to the emergency room.  Final Clinical Impressions(s) / UC Diagnoses   Final diagnoses:  Chronic pain of right knee  Primary osteoarthritis of right knee  Abnormal x-ray of knee  Elevated blood pressure reading with diagnosis of hypertension     Discharge Instructions      Your x-ray showed worsening arthritis.  It is very important that you follow up with orthopedics.  Please call to schedule an appointment with them soon as possible.  Continue your Voltaren gel.  Use your brace to provide comfort and support.  Keep it elevated and use ice for additional symptom relief.  I have called in a few doses of pain medication.  We cannot use this on a regular basis as it is habit-forming and can have very significant side effects.  Use 1/2 to 1 tablet every 12 hours as needed.  This will make you sleepy do not drive or drink alcohol while taking it.  Be very careful not to fall with this medication.  I do recommend taking it right before bed.  If you have any worsening symptoms please return for reevaluation.     ED Prescriptions     Medication Sig Dispense Auth. Provider   HYDROcodone-acetaminophen (NORCO/VICODIN) 5-325 MG tablet Take 0.5-1 tablets by mouth 2 (two) times daily as needed. 4 tablet Jeris Roser K, PA-C      I have reviewed the PDMP during this encounter.   Terrilee Croak, PA-C 10/07/21 1237

## 2021-10-07 NOTE — Discharge Instructions (Signed)
Your x-ray showed worsening arthritis.  It is very important that you follow up with orthopedics.  Please call to schedule an appointment with them soon as possible.  Continue your Voltaren gel.  Use your brace to provide comfort and support.  Keep it elevated and use ice for additional symptom relief.  I have called in a few doses of pain medication.  We cannot use this on a regular basis as it is habit-forming and can have very significant side effects.  Use 1/2 to 1 tablet every 12 hours as needed.  This will make you sleepy do not drive or drink alcohol while taking it.  Be very careful not to fall with this medication.  I do recommend taking it right before bed.  If you have any worsening symptoms please return for reevaluation.

## 2021-10-07 NOTE — ED Triage Notes (Signed)
Patient c/o right knee pain x 1 day, no injury, hard to bare weight on the knee.  Patient has taken Ibuprofen.  Patient is having nausea from the pain.  The knee is swollen.  No history of gout.

## 2021-10-10 ENCOUNTER — Encounter: Payer: Self-pay | Admitting: Internal Medicine

## 2021-10-10 ENCOUNTER — Ambulatory Visit (INDEPENDENT_AMBULATORY_CARE_PROVIDER_SITE_OTHER): Payer: Medicare HMO

## 2021-10-10 ENCOUNTER — Ambulatory Visit (INDEPENDENT_AMBULATORY_CARE_PROVIDER_SITE_OTHER): Payer: Medicare HMO | Admitting: Internal Medicine

## 2021-10-10 VITALS — BP 153/68 | HR 72 | Temp 97.8°F | Ht 64.5 in | Wt 194.9 lb

## 2021-10-10 VITALS — BP 159/83 | HR 86 | Temp 97.8°F | Ht 64.5 in | Wt 194.9 lb

## 2021-10-10 DIAGNOSIS — I5032 Chronic diastolic (congestive) heart failure: Secondary | ICD-10-CM

## 2021-10-10 DIAGNOSIS — Z0000 Encounter for general adult medical examination without abnormal findings: Secondary | ICD-10-CM | POA: Diagnosis not present

## 2021-10-10 DIAGNOSIS — I110 Hypertensive heart disease with heart failure: Secondary | ICD-10-CM

## 2021-10-10 DIAGNOSIS — H6123 Impacted cerumen, bilateral: Secondary | ICD-10-CM | POA: Diagnosis not present

## 2021-10-10 DIAGNOSIS — M170 Bilateral primary osteoarthritis of knee: Secondary | ICD-10-CM

## 2021-10-10 DIAGNOSIS — Z1159 Encounter for screening for other viral diseases: Secondary | ICD-10-CM | POA: Insufficient documentation

## 2021-10-10 DIAGNOSIS — Z131 Encounter for screening for diabetes mellitus: Secondary | ICD-10-CM | POA: Insufficient documentation

## 2021-10-10 DIAGNOSIS — Z87891 Personal history of nicotine dependence: Secondary | ICD-10-CM

## 2021-10-10 DIAGNOSIS — E7800 Pure hypercholesterolemia, unspecified: Secondary | ICD-10-CM

## 2021-10-10 DIAGNOSIS — Z23 Encounter for immunization: Secondary | ICD-10-CM | POA: Insufficient documentation

## 2021-10-10 DIAGNOSIS — Z1211 Encounter for screening for malignant neoplasm of colon: Secondary | ICD-10-CM | POA: Insufficient documentation

## 2021-10-10 DIAGNOSIS — I10 Essential (primary) hypertension: Secondary | ICD-10-CM | POA: Diagnosis not present

## 2021-10-10 DIAGNOSIS — Z1231 Encounter for screening mammogram for malignant neoplasm of breast: Secondary | ICD-10-CM

## 2021-10-10 LAB — POCT GLYCOSYLATED HEMOGLOBIN (HGB A1C): Hemoglobin A1C: 5.3 % (ref 4.0–5.6)

## 2021-10-10 LAB — GLUCOSE, CAPILLARY: Glucose-Capillary: 125 mg/dL — ABNORMAL HIGH (ref 70–99)

## 2021-10-10 MED ORDER — SPIRONOLACTONE 25 MG PO TABS: 25.0000 mg | ORAL_TABLET | Freq: Every day | ORAL | 11 refills | Status: DC

## 2021-10-10 MED ORDER — MELOXICAM 7.5 MG PO TABS: 7.5000 mg | ORAL_TABLET | Freq: Every day | ORAL | 0 refills | Status: DC

## 2021-10-10 NOTE — Patient Instructions (Addendum)
Thank you, [name] for allowing [place] to provide your care today. Today we discussed:    High blood pressure: Your blood pressure was a little high today. You have been in pain which can cause it to be elevated, but I want to make sure that it is not too high when you are at home. Please keep taking hydrochlorothiazide and losartan. You will start a new medication called spironolactone once daily. You will no longer take metoprolol.  Knee arthritis: I have sent in a month supply of a medication called meloxicam. You can take this medication once daily for pain.  You are also due for a mammogram, colonoscopy, and hepatitis C screening. I have placed the necessary orders for you today.   Labs today: BMP: To check your kidneys and electrolytes Lipid panel: To check your cholesterol levels HbA1c: To screen for diabetes  Follow up: 1 month    Should you have any questions or concerns please call the internal medicine clinic at [phone].    My best, Farrel Gordon, D.O. [place]

## 2021-10-10 NOTE — Assessment & Plan Note (Signed)
Last lipid panel was obtained in 2019 and showed LDL at goal at 89. She is managed with atorvastatin 80 mg daily and zetia 10 mg daily and reports compliance with this medication.  Plan: Continue atorvastatin 80 mg daily and Zetia 10 mg daily. Recheck lipid panel today.  ADDENDUM: LDL above goal of 100 at 118. Discussed results with patient and encouraged her to make healthier lifestyle choices and to continue taking Zetia and atorvastatin. Will repeat lipid panel in 6 months.

## 2021-10-10 NOTE — Assessment & Plan Note (Signed)
Patient is due for routine screening mammogram. Plan:Mammogram order placed.

## 2021-10-10 NOTE — Progress Notes (Unsigned)
Subjective:   Victoria Campos is a 71 y.o. female who presents for an Initial Medicare Annual Wellness Visit. I connected with  Victoria Campos on 10/10/21 by a  in person visit   enabled telemedicine application and verified that I am speaking with the correct person using two identifiers.  Patient Location: Other:  [facility]   Provider Location: Office/Clinic  I discussed the limitations of evaluation and management by telemedicine. The patient expressed understanding and agreed to proceed.  Review of Systems    Defer to PCP.        Objective:    Today's Vitals   10/10/21 1318 10/10/21 1321  BP: (!) 159/83   Pulse: 86   Temp: 97.8 F (36.6 C)   TempSrc: Oral   SpO2: 100%   Weight: 194 lb 14.4 oz (88.4 kg)   Height: 5' 4.5" (1.638 m)   PainSc:  10-Worst pain ever   Body mass index is 32.94 kg/m.     10/10/2021    1:23 PM 10/10/2021    9:27 AM 04/14/2021    9:52 AM 03/24/2021   11:45 AM 02/14/2021   10:32 AM 09/07/2020    2:01 PM 09/23/2019   10:44 AM  Advanced Directives  Does Patient Have a Medical Advance Directive? No No No No No No No  Would patient like information on creating a medical advance directive? No - Patient declined No - Patient declined No - Patient declined No - Patient declined No - Patient declined Yes (MAU/Ambulatory/Procedural Areas - Information given) Yes (MAU/Ambulatory/Procedural Areas - Information given)    Current Medications (verified) Outpatient Encounter Medications as of 10/10/2021  Medication Sig   aspirin 81 MG chewable tablet Chew 81 mg by mouth daily.   atorvastatin (LIPITOR) 80 MG tablet TAKE 1 TABLET EVERY DAY   diclofenac Sodium (VOLTAREN) 1 % GEL Apply 4 g topically 4 (four) times daily.   ezetimibe (ZETIA) 10 MG tablet Take 1 tablet (10 mg total) by mouth daily.   fluticasone (FLONASE) 50 MCG/ACT nasal spray USE 2 SPRAYS IN EACH NOSTRIL EVERY DAY   furosemide (LASIX) 20 MG tablet TAKE 1 TABLET EVERY DAY    hydrochlorothiazide (HYDRODIURIL) 25 MG tablet Take 1 tablet (25 mg total) by mouth daily.   HYDROcodone-acetaminophen (NORCO/VICODIN) 5-325 MG tablet Take 0.5-1 tablets by mouth 2 (two) times daily as needed.   IBU 600 MG tablet TAKE 1 TABLET EVERY 6 HOURS AS NEEDED   losartan (COZAAR) 100 MG tablet Take 1 tablet (100 mg total) by mouth daily.   spironolactone (ALDACTONE) 25 MG tablet Take 1 tablet (25 mg total) by mouth daily.   No facility-administered encounter medications on file as of 10/10/2021.    Allergies (verified) Sulfamethoxazole-trimethoprim   History: Past Medical History:  Diagnosis Date   ANXIETY 03/10/2004   Qualifier: Diagnosis of  By: Rankins MD, [place]     Heart murmur    Hypercholesteremia    Hypertension    Insomnia    Pulmonary embolism ([place]) 09/23/2011   Acute segmental bilateral segmental PE, neg dopplers, neg coag w/u    Past Surgical History:  Procedure Laterality Date   arm surgery     torn ligamint   bunyon     removed   tubal ligation     Family History  Problem Relation Age of Onset   Heart attack Father        died at age 68   Heart disease Father    Cancer  Sister        colon   Cancer Sister        breast   Cancer Sister    Cancer Brother        colon   Social History   Socioeconomic History   Marital status: Single    Spouse name: Not on file   Number of children: Not on file   Years of education: Not on file   Highest education level: Not on file  Occupational History   Not on file  Tobacco Use   Smoking status: Former    Types: Cigarettes    Quit date: 03/12/1968    Years since quitting: 53.6   Smokeless tobacco: Never  Vaping Use   Vaping Use: Never used  Substance and Sexual Activity   Alcohol use: Yes    Alcohol/week: 1.0 standard drink of alcohol    Types: 1 Glasses of wine per week    Comment: occasional   Drug use: No   Sexual activity: Not Currently  Other Topics Concern   Not on file  Social History  Narrative   Current Social History 09/07/2020        Patient lives with family in a home which is 1 story. There is one step up to the entrance the patient uses.       Patient's method of transportation is personal car and her daughter drives her to appointments as well.      The highest level of education was college diploma.      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      Identified important Relationships are God and her children       Pets : None       Interests / Fun: Traveling with her family; cookouts       Current Stressors: None           Social Determinants of Health   Financial Resource Strain: Low Risk  (10/10/2021)   Overall Financial Resource Strain (CARDIA)    Difficulty of Paying Living Expenses: Not hard at all  Food Insecurity: No Food Insecurity (10/10/2021)   Hunger Vital Sign    Worried About Running Out of Food in the Last Year: Never true    [place] in the Last Year: Never true  Transportation Needs: No Transportation Needs (10/10/2021)   PRAPARE - Hydrologist (Medical): No    Lack of Transportation (Non-Medical): No  Physical Activity: Insufficiently Active (10/10/2021)   Exercise Vital Sign    Days of Exercise per Week: 7 days    Minutes of Exercise per Session: 20 min  Stress: No Stress Concern Present (10/10/2021)   [place]    Feeling of Stress : Not at all  Social Connections: Moderately Isolated (10/10/2021)   Social Connection and Isolation Panel [NHANES]    Frequency of Communication with Friends and Family: More than three times a week    Frequency of Social Gatherings with Friends and Family: More than three times a week    Attends Religious Services: More than 4 times per year    Active Member of [employer] or Organizations: No    Attends Archivist Meetings: Never    Marital Status: Divorced     Tobacco Counseling Counseling given: Not Answered   Clinical Intake:  Pre-visit preparation completed: Yes  Pain :  0-10 Pain Score: 10-Worst pain ever Pain Type: Acute pain Pain Location: Knee Pain Orientation: Right Pain Descriptors / Indicators: Aching Pain Onset: In the past 7 days Pain Frequency: Constant     BMI - recorded: 32.94 Nutritional Status: BMI > 30  Obese Nutritional Risks: None Diabetes: No  How often do you need to have someone help you when you read instructions, pamphlets, or other written materials from your doctor or pharmacy?: 1 - Never What is the last grade level you completed in school?: 14 years of school  Diabetic?NO  Interpreter Needed?: No  Information entered by :: Dekayla Prestridge, CMA 10/10/2021   Activities of Daily Living    10/10/2021    9:32 AM 10/10/2021    9:26 AM  In your present state of health, do you have any difficulty performing the following activities:  Hearing? 0 0  Vision? 0 0  Difficulty concentrating or making decisions? 0 0  Walking or climbing stairs? 1 1  Dressing or bathing? 0 0  Doing errands, shopping? 0 0  Preparing Food and eating ? N   Using the Toilet? N   In the past six months, have you accidently leaked urine? N   Do you have problems with loss of bowel control? N   Managing your Medications? N   Managing your Finances? N   Housekeeping or managing your Housekeeping? N     Patient Care Team: Farrel Gordon, DO as PCP - General  Indicate any recent Medical Services you may have received from other than [facility] providers in the past year (date may be approximate).     Assessment:   This is a routine wellness examination for Tyresha.  Hearing/Vision screen No results found.  Dietary issues and exercise activities discussed: Current Exercise Habits: Home exercise routine, Time (Minutes): 30, Frequency (Times/Week): 7, Weekly Exercise (Minutes/Week): 210, Intensity: Mild   Goals Addressed   None    Depression Screen    10/10/2021    1:25 PM 10/10/2021    9:29 AM 10/10/2021    9:26 AM 04/14/2021    9:54 AM 03/24/2021   11:46 AM 02/14/2021    1:23 PM 11/09/2020   11:05 AM  PHQ 2/9 Scores  PHQ - 2 Score 0 0 0 0 0 0 0  PHQ- 9 Score    '2 1 5 2    '$ Fall Risk    10/10/2021    1:23 PM 10/10/2021    9:26 AM 04/14/2021    9:53 AM 03/24/2021   11:45 AM 02/14/2021   10:32 AM  Fall Risk   Falls in the past year? 0 0 0 0 0  Number falls in past yr: 0 0  0 0  Injury with Fall? 0 0  0 0  Risk for fall due to : No Fall Risks No Fall Risks  No Fall Risks No Fall Risks  Follow up Falls evaluation completed Falls evaluation completed Falls evaluation completed Falls evaluation completed Falls evaluation completed    [place]:  Any stairs in or around the home? No  If so, are there any without handrails? No  Home free of loose throw rugs in walkways, pet beds, electrical cords, etc? Yes  Adequate lighting in your home to reduce risk of falls? Yes   ASSISTIVE DEVICES UTILIZED TO PREVENT FALLS:  Life alert? No  Use of a cane, walker or w/c? No  Grab bars in the bathroom? No  Shower chair or bench  in shower? No  Elevated toilet seat or a handicapped toilet? No   TIMED UP AND GO:  Was the test performed? No .  Length of time to ambulate 10 feet: n/a sec.    Cognitive Function:        10/10/2021    1:24 PM  6CIT Screen  What Year? 0 points  What month? 0 points  What time? 0 points  Count back from 20 0 points  Months in reverse 0 points  Repeat phrase 0 points  Total Score 0 points    Immunizations Immunization History  Administered Date(s) Administered   Fluad Quad(high Dose 65+) 02/14/2021   Influenza Split 10/18/2011   Influenza Whole 12/16/2008, 01/25/2010   Influenza,inj,Quad PF,6+ Mos 12/22/2012, 01/27/2014, 02/16/2015, 01/21/2017, 12/19/2017, 01/12/2019   PFIZER Comirnaty(Gray Top)Covid-19 Tri-Sucrose Vaccine 04/13/2019, 05/11/2019,  01/11/2020   PPD Test 10/12/2014, 06/01/2019   Pneumococcal Conjugate-13 05/10/2014   Pneumococcal Polysaccharide-23 06/21/2015   Td 03/12/2001   Tdap 10/20/2012    TDAP status: Up to date  Flu Vaccine status: Up to date  Pneumococcal vaccine status: Up to date  Covid-19 vaccine status: Completed vaccines  Qualifies for Shingles Vaccine? Yes   Zostavax completed No   Shingrix Completed?: No.    Education has been provided regarding the importance of this vaccine. Patient has been advised to call insurance company to determine out of pocket expense if they have not yet received this vaccine. Advised may also receive vaccine at local pharmacy or Health Dept. Verbalized acceptance and understanding.  Screening Tests Health Maintenance  Topic Date Due   Hepatitis C Screening  Never done   Zoster Vaccines- Shingrix (1 of 2) Never done   COVID-19 Vaccine (4 - Pfizer series) 03/07/2020   MAMMOGRAM  07/27/2021   COLONOSCOPY (Pts 45-19yr Insurance coverage will need to be confirmed)  08/31/2021   INFLUENZA VACCINE  10/10/2021   TETANUS/TDAP  10/21/2022   Pneumonia Vaccine 71 Years old  Completed   DEXA SCAN  Completed   HPV VACCINES  Aged Out    Health Maintenance  Health Maintenance Due  Topic Date Due   Hepatitis C Screening  Never done   Zoster Vaccines- Shingrix (1 of 2) Never done   COVID-19 Vaccine (4 - Pfizer series) 03/07/2020   MAMMOGRAM  07/27/2021   COLONOSCOPY (Pts 45-469yrInsurance coverage will need to be confirmed)  08/31/2021   INFLUENZA VACCINE  10/10/2021    Colorectal cancer screening: Type of screening: Colonoscopy. Completed 09/01/2011. Repeat every 10 years  Mammogram status: Completed 07/28/2019. Repeat every year  Bone Density status: Completed 09/23/2015. Results reflect: Bone density results: OSTEOPENIA. Repeat every 2 years.  Lung Cancer Screening: (Low Dose CT Chest recommended if Age 853-80ears, 30 pack-year currently smoking OR have quit w/in  15years.) does not qualify.   Lung Cancer Screening Referral: Defer to PCP.   Additional Screening:  Hepatitis C Screening: does qualify; Completed defer to PCP.   Vision Screening: Recommended annual ophthalmology exams for early detection of glaucoma and other disorders of the eye. Is the patient up to date with their annual eye exam?  No  Who is the provider or what is the name of the office in which the patient attends annual eye exams? Defer to PCP If pt is not established with a provider, would they like to be referred to a provider to establish care? Yes .   Dental Screening: Recommended annual dental exams for proper oral hygiene  Community Resource Referral / Chronic  Care Management: CRR required this visit?  No   CCM required this visit?  No      Plan:     I have personally reviewed and noted the following in the patient's chart:   Medical and social history Use of alcohol, tobacco or illicit drugs  Current medications and supplements including opioid prescriptions. Patient is not currently taking opioid prescriptions. Functional ability and status Nutritional status Physical activity Advanced directives List of other physicians Hospitalizations, surgeries, and ER visits in previous 12 months Vitals Screenings to include cognitive, depression, and falls Referrals and appointments  In addition, I have reviewed and discussed with patient certain preventive protocols, quality metrics, and best practice recommendations. A written personalized care plan for preventive services as well as general preventive health recommendations were provided to patient.     Jonda Alanis, CMA   10/10/2021   Nurse Notes: Face to Face.   Ms. Revelle , Thank you for taking time to come for your Medicare Wellness Visit. I appreciate your ongoing commitment to your health goals. Please review the following plan we discussed and let me know if I can assist you in the future.   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  Never done   Zoster (Shingles) Vaccine (1 of 2) Never done   COVID-19 Vaccine (4 - Pfizer series) 03/07/2020   Mammogram  07/27/2021   Colon Cancer Screening  08/31/2021   Flu Shot  10/10/2021   Tetanus Vaccine  10/21/2022   Pneumonia Vaccine  Completed   DEXA scan (bone density measurement)  Completed   HPV Vaccine  Aged Out

## 2021-10-10 NOTE — Assessment & Plan Note (Signed)
Patient is due for routine colonoscopy. Plan: Referral placed to GI for colonoscopy.

## 2021-10-10 NOTE — Assessment & Plan Note (Signed)
Patient is on Lasix 20 mg daily and reports that she is taking this as prescribed. Her weight has remained stable over last several visits and she has not had progressive dyspnea suggesting overload. Pitting edema is not present on exam. She takes Lasix when she notices that she is having leg swelling as it causes her to feel like she has low energy, and overall "weird." She hasn't noticed any edema recently or dyspnea. No troubles with sleeping at night.  Assessment: No signs of exacerbation at this time.  Plan: Continue Lasix 20 mg daily.

## 2021-10-10 NOTE — Assessment & Plan Note (Signed)
Patient is overweight with hyperlipidemia and hypertension. Her comorbidities are concerning for metabolic syndrome. Plan: Check HbA1c.  ADDENDUM: HbA1c normal at 5.3%.

## 2021-10-10 NOTE — Assessment & Plan Note (Addendum)
Patient's BP is uncontrolled today at 159/83 with improvement to 153/68 on recheck. At home she does keep a log and typically her measurements range typically with SBP in the low 140s. Currently managed with HCTZ 25 mg daily, metoprolol 50 mg daily, and losartan 100 mg daily which she reports compliance with though it does not appear that metoprolol is filled enough to be adequately dosed. She has taken her medications this morning.  Plan: Check BMP. Start spironolactone 25 mg daily. Discontinue metoprolol.  ADDENDUM: BMP unremarkable.

## 2021-10-10 NOTE — Assessment & Plan Note (Signed)
Patient has not previously been screened for hepatitis C. Plan: Will obtain labs for hepatitis C screening today.  ADDENDUM: HCV negative.

## 2021-10-10 NOTE — Assessment & Plan Note (Signed)
Patient is due for annual influenza vaccine. Plan:Will administer influenza vaccine at today's visit.

## 2021-10-10 NOTE — Assessment & Plan Note (Signed)
Bilateral cerumen impaction present on exam today.  Plan: Will flush her ears prior to her leaving clinic today. She does have an appointment with audiology next Friday to discuss her hearing loss concerns.

## 2021-10-10 NOTE — Progress Notes (Signed)
CC: routine check-up, knee pain  HPI:  Victoria Campos is a 71 y.o. female with past medical history as detailed below who presents today for routine check-up. Of note, the patient was recently evaluated at an urgent care for severe knee pain. Please see problem-based charting for detailed assessment and plan.  Past Medical History:  Diagnosis Date   ANXIETY 03/10/2004   Qualifier: Diagnosis of  By: Radene Ou MD, [place]     Heart murmur    Hypercholesteremia    Hypertension    Insomnia    Pulmonary embolism ([place]) 09/23/2011   Acute segmental bilateral segmental PE, neg dopplers, neg coag w/u    Review of Systems:  Negative unless otherwise stated.  Physical Exam:  Vitals:   10/10/21 0922 10/10/21 1012  BP: (!) 159/83 (!) 153/68  Pulse: 86 72  Temp: 97.8 F (36.6 C)   TempSrc: Oral   SpO2: 100%   Weight: 194 lb 14.4 oz (88.4 kg)   Height: 5' 4.5" (1.638 m)    Constitutional:Patient is sitting comfortably, in no acute distress. HENT:Bilateral cerumen impaction. Cardio:Regular rate and rhythm. No murmurs, rubs, or gallops. Pulm:Clear to auscultation bilaterally. Normal work of breathing on room air. EYC:[id number] for extremity edema. Skin:Skin is warm and dry without rashes or lesions. Neuro:Awake, alert, oriented x3. No focal deficit noted.  Psych:Pleasant mood and affect.  Assessment & Plan:   See Encounters Tab for problem based charting.  Cerumen impaction Bilateral cerumen impaction present on exam today.  Plan: Will flush her ears prior to her leaving clinic today. She does have an appointment with audiology next Friday to discuss her hearing loss concerns.  Chronic heart failure with preserved ejection fraction ([place]) Patient is on Lasix 20 mg daily and reports that she is taking this as prescribed. Her weight has remained stable over last several visits and she has not had progressive dyspnea suggesting overload. Pitting edema is not present on exam. She  takes Lasix when she notices that she is having leg swelling as it causes her to feel like she has low energy, and overall "weird." She hasn't noticed any edema recently or dyspnea. No troubles with sleeping at night.  Assessment: No signs of exacerbation at this time.  Plan: Continue Lasix 20 mg daily.   Encounter for screening colonoscopy Patient is due for routine colonoscopy. Plan: Referral placed to GI for colonoscopy.  Need for immunization against influenza Patient is due for annual influenza vaccine. Plan:Will administer influenza vaccine at today's visit.  Encounter for screening mammogram for breast cancer Patient is due for routine screening mammogram. Plan:Mammogram order placed.  Need for hepatitis C screening test Patient has not previously been screened for hepatitis C. Plan: Will obtain labs for hepatitis C screening today.  Essential hypertension Patient's BP is uncontrolled today at 159/83 with improvement to 153/68 on recheck. At home she does keep a log and typically her measurements range typically with SBP in the low 140s. Currently managed with HCTZ 25 mg daily, metoprolol 50 mg daily, and losartan 100 mg daily which she reports compliance with though it does not appear that metoprolol is filled enough to be adequately dosed. She has taken her medications this morning.  Plan: Check BMP. Start spironolactone 25 mg daily. Discontinue metoprolol.  Hyperlipidemia Last lipid panel was obtained in 2019 and showed LDL at goal at 89. She is managed with atorvastatin 80 mg daily and zetia 10 mg daily and reports compliance with this medication.  Plan: Continue atorvastatin  80 mg daily and Zetia 10 mg daily. Recheck lipid panel today.  Osteoarthritis of both knees Patient presented to urgent care 07/29 due to severe pain in her R knee. X-ray at that time showed worsening knee arthritis. She was counseled to reach out to orthopedics to schedule an appointment for further  follow up at that time which she has not done. For pain she was given a short course of norco and instructed to use voltaren gel topically in addition to a knee brace, ice, and elevation. She denies new trauma or falls, explaining that it is pre-existing pain that has been getting worse over time. She is wearing a knee brace which is helping some. Sitting and resting helps the pain and ambulation takes it to a 10/10. Norco that she was given at the urgent care didn't help much with the pain, rather just made her sleepy, and she has only taken one dose. She is pretty active in her life and needs to be able to ambulate well. She is not interested in steroid injection to the knee or surgery at this time, and states that she would rather deal with the pain. Plan: Continue voltaren gel. Patient offered steroid injection today but she declined. Will trial one-month course of Meloxicam 7.5 mg once daily and encourage continued use of voltaren gel, ice, and elevation of the knee. She can continue to use her knee brace as well if she feels that she receives support from it. Will also check renal function on BMP today.  Encounter for screening for diabetes mellitus Patient is overweight with hyperlipidemia and hypertension. Her comorbidities are concerning for metabolic syndrome. Plan: Check HbA1c.   Patient discussed with Dr. Philipp Ovens

## 2021-10-10 NOTE — Assessment & Plan Note (Signed)
Patient presented to urgent care 07/29 due to severe pain in her R knee. X-ray at that time showed worsening knee arthritis. She was counseled to reach out to orthopedics to schedule an appointment for further follow up at that time which she has not done. For pain she was given a short course of norco and instructed to use voltaren gel topically in addition to a knee brace, ice, and elevation. She denies new trauma or falls, explaining that it is pre-existing pain that has been getting worse over time. She is wearing a knee brace which is helping some. Sitting and resting helps the pain and ambulation takes it to a 10/10. Norco that she was given at the urgent care didn't help much with the pain, rather just made her sleepy, and she has only taken one dose. She is pretty active in her life and needs to be able to ambulate well. She is not interested in steroid injection to the knee or surgery at this time, and states that she would rather deal with the pain. Plan: Continue voltaren gel. Patient offered steroid injection today but she declined. Will trial one-month course of Meloxicam 7.5 mg once daily and encourage continued use of voltaren gel, ice, and elevation of the knee. She can continue to use her knee brace as well if she feels that she receives support from it. Will also check renal function on BMP today.

## 2021-10-10 NOTE — Patient Instructions (Signed)

## 2021-10-11 LAB — HCV AB W REFLEX TO QUANT PCR: HCV Ab: NONREACTIVE

## 2021-10-11 LAB — LIPID PANEL
Chol/HDL Ratio: 3 ratio (ref 0.0–4.4)
Cholesterol, Total: 198 mg/dL (ref 100–199)
HDL: 65 mg/dL (ref >39)
LDL Chol Calc (NIH): 118 mg/dL — ABNORMAL HIGH (ref 0–99)
Triglycerides: 83 mg/dL (ref 0–149)
VLDL Cholesterol Cal: 15 mg/dL (ref 5–40)

## 2021-10-11 LAB — BMP8+ANION GAP
Anion Gap: 17 mmol/L (ref 10.0–18.0)
BUN/Creatinine Ratio: 17 (ref 12–28)
BUN: 11 mg/dL (ref 8–27)
CO2: 22 mmol/L (ref 20–29)
Calcium: 10.1 mg/dL (ref 8.7–10.3)
Chloride: 102 mmol/L (ref 96–106)
Creatinine, Ser: 0.64 mg/dL (ref 0.57–1.00)
Glucose: 112 mg/dL — ABNORMAL HIGH (ref 70–99)
Potassium: 3.8 mmol/L (ref 3.5–5.2)
Sodium: 141 mmol/L (ref 134–144)
eGFR: 94 mL/min/{1.73_m2} (ref >59)

## 2021-10-11 LAB — HCV INTERPRETATION

## 2021-10-12 NOTE — Progress Notes (Signed)
I reviewed the AWV findings with the provider who conducted the visit. I was present in the office suite and immediately available to provide assistance and direction throughout the time the service was provided.   Farrel Gordon, DO

## 2021-10-13 NOTE — Progress Notes (Signed)
[facility] Attending ? ?Case discussed with Dr. Dean  At the time of the visit.  We reviewed the resident?s history and exam and pertinent patient test results.  I agree with the assessment, diagnosis, and plan of care documented in the resident?s note.  ?

## 2021-10-13 NOTE — Addendum Note (Signed)
Addended by: Jodean Lima on: 10/13/2021 01:27 PM   Modules accepted: Level of Service

## 2021-10-30 NOTE — Progress Notes (Signed)
[facility] Medicine Clinic Attending  Case and documentation of Dr. Dean  at the time of the visit reviewed.  I reviewed the AWV findings.  I agree with the assessment, diagnosis, and plan of care documented in the AWV note.     

## 2021-11-06 ENCOUNTER — Other Ambulatory Visit: Payer: Self-pay | Admitting: Internal Medicine

## 2021-11-06 DIAGNOSIS — M170 Bilateral primary osteoarthritis of knee: Secondary | ICD-10-CM

## 2021-11-07 NOTE — Telephone Encounter (Signed)
Next appt scheduled 11/10/21 with Dr Collene Gobble.

## 2021-11-10 ENCOUNTER — Ambulatory Visit (INDEPENDENT_AMBULATORY_CARE_PROVIDER_SITE_OTHER): Payer: Medicare HMO | Admitting: Student

## 2021-11-10 ENCOUNTER — Encounter: Payer: Self-pay | Admitting: Student

## 2021-11-10 VITALS — BP 134/76 | HR 78 | Temp 98.1°F | Ht 64.5 in | Wt 188.5 lb

## 2021-11-10 DIAGNOSIS — Z0000 Encounter for general adult medical examination without abnormal findings: Secondary | ICD-10-CM

## 2021-11-10 DIAGNOSIS — I10 Essential (primary) hypertension: Secondary | ICD-10-CM | POA: Diagnosis not present

## 2021-11-10 DIAGNOSIS — M1712 Unilateral primary osteoarthritis, left knee: Secondary | ICD-10-CM

## 2021-11-10 DIAGNOSIS — Z1211 Encounter for screening for malignant neoplasm of colon: Secondary | ICD-10-CM

## 2021-11-10 DIAGNOSIS — M170 Bilateral primary osteoarthritis of knee: Secondary | ICD-10-CM

## 2021-11-10 NOTE — Patient Instructions (Addendum)
[name], it was a pleasure seeing you today!  Today we discussed: - Knee pain: We discussed your diagnosis of osteoarthritis. This means the cushion between your bones has been worn down from time. I have given you a picture of what a total knee replacement looks like. I have also put in a referral to our orthopedic surgeons so you can go talk to them.  - For your medications, I want you to ONLY take ibuprofen when needed. Otherwise just use ice, heat, and voltaren gel.  - I have placed a referral for you to get your colonoscopy.  - Please make sure to follow-up and get your influenza and Shingles vaccines!  - I will call you next week with your lab results.  I have ordered the following labs today:   Lab Orders         BMP8+Anion Gap      Referrals ordered today:    Referral Orders         Ambulatory referral to Orthopedic Surgery         Ambulatory referral to Gastroenterology      Follow-up: 3 months   Please make sure to arrive 15 minutes prior to your next appointment. If you arrive late, you may be asked to reschedule.   We look forward to seeing you next time. Please call our clinic at [phone] if you have any questions or concerns. The best time to call is Monday-Friday from 9am-4pm, but there is someone available 24/7. If after hours or the weekend, call the main hospital number and ask for the Internal Medicine Resident On-Call. If you need medication refills, please notify your pharmacy one week in advance and they will send [place] a request.  Thank you for letting us take part in your care. Wishing you the best!  Thank you, Linward Natal, MD

## 2021-11-11 LAB — BMP8+ANION GAP
Anion Gap: 17 mmol/L (ref 10.0–18.0)
BUN/Creatinine Ratio: 16 (ref 12–28)
BUN: 13 mg/dL (ref 8–27)
CO2: 20 mmol/L (ref 20–29)
Calcium: 10.3 mg/dL (ref 8.7–10.3)
Chloride: 105 mmol/L (ref 96–106)
Creatinine, Ser: 0.79 mg/dL (ref 0.57–1.00)
Glucose: 122 mg/dL — ABNORMAL HIGH (ref 70–99)
Potassium: 4.1 mmol/L (ref 3.5–5.2)
Sodium: 142 mmol/L (ref 134–144)
eGFR: 79 mL/min/{1.73_m2} (ref >59)

## 2021-11-11 NOTE — Progress Notes (Addendum)
   CC: knee pain  HPI:  Victoria Campos is a 71 y.o. person with chronic heart failure with preserved ejection fraction, osteoarthritis presenting to [facility] for knee pain.  Please see problem-based list for further details, assessments, and plans.  Past Medical History:  Diagnosis Date   ANXIETY 03/10/2004   Qualifier: Diagnosis of  By: Radene Ou MD, [place]     Heart murmur    Hypercholesteremia    Hypertension    Insomnia    Pulmonary embolism ([place]) 09/23/2011   Acute segmental bilateral segmental PE, neg dopplers, neg coag w/u    Review of Systems:  As per HPI  Physical Exam:  Vitals:   11/10/21 0953  BP: 134/76  Pulse: 78  Temp: 98.1 F (36.7 C)  TempSrc: Oral  SpO2: 98%  Weight: 188 lb 8 oz (85.5 kg)  Height: 5' 4.5" (1.638 m)   General: Resting comfortably in no acute distress CV: Regular rate, rhythm. No murmurs appreciated. Warm extremities. Pulm: Normal work of breathing on room air. Clear to ausculation bilaterally.  MSK: Generalized soft tissue swelling R knee > L knee. No bony deformities appreciated. No evidence of effusion in bilateral knees. R knee with crepitus with passive flexion. Normal anterior/posterior drawer tests and vagus/valgus stress tests bilaterally. Neuro: Awake, alert, conversing appropriately. Grossly non-focal. Psych: Normal mood, affect, speech.  Assessment & Plan:   Osteoarthritis of both knees Victoria Campos is presenting to [facility] today to discuss recent worsening of her right knee pain. During her previous visit, she was prescribed meloxicam. Since that time she reports taking both meloxicam and ibuprofen daily to help control the pain. She is quite limited functionally, as it is difficult for her to ambulate at all with her current state.   Victoria Campos and I had a long conversation about osteoarthritis and the progression of the disease. Discussed that typical next steps for treatment after medications would be steroid injections and  potential surgery. She reports that she previously had steroid injections that did not provide any pain relief. When asked about surgery, she states she does not want "anything taken out of me." We went over potential arthoplasty and what it typically consists of. She states she is interested in learning more and would like to talk to an orthopedic surgeon for more information. We will put this referral in for today.  In addition, I discussed with Victoria Campos the potential harm of continuing to take multiple NSAID's, especially in the setting of four medications that affect the kidneys already. She reports that she would prefer to continue with ibuprofen and d/c the meloxicam at this time. Discussed that she should continue conservative measures for pain relief, including ice/heat/topical anti-inflammatories. Patient agreeable at this time. Given she has been on multiple potential nephrotoxic medications we will check BMP today.  - STOP meloxicam - Continue ibuprofen '600mg'$  every 6 hours only when in severe pain - Orthopedic surgery referral placed - Continue ice/heat/voltaren gel - BMP today   Healthcare maintenance - Due for colonoscopy, will place referral today - Holding off immunizations today, encouraged Victoria Campos to update her vaccinations at her local pharmacy and bring paperwork with her at next appointment.    Patient discussed with Dr.  Garnet Koyanagi, MD Internal Medicine PGY-3 Pager: [phone]

## 2021-11-11 NOTE — Assessment & Plan Note (Addendum)
Victoria Campos is presenting to [facility] today to discuss recent worsening of her right knee pain. During her previous visit, she was prescribed meloxicam. Since that time she reports taking both meloxicam and ibuprofen daily to help control the pain. She is quite limited functionally, as it is difficult for her to ambulate at all with her current state.   Victoria Campos and I had a long conversation about osteoarthritis and the progression of the disease. Discussed that typical next steps for treatment after medications would be steroid injections and potential surgery. She reports that she previously had steroid injections that did not provide any pain relief. When asked about surgery, she states she does not want "anything taken out of me." We went over potential arthoplasty and what it typically consists of. She states she is interested in learning more and would like to talk to an orthopedic surgeon for more information. We will put this referral in for today.  In addition, I discussed with Victoria Campos the potential harm of continuing to take multiple NSAID's, especially in the setting of four medications that affect the kidneys already. She reports that she would prefer to continue with ibuprofen and d/c the meloxicam at this time. Discussed that she should continue conservative measures for pain relief, including ice/heat/topical anti-inflammatories. Patient agreeable at this time. Given she has been on multiple potential nephrotoxic medications we will check BMP today.  - STOP meloxicam - Continue ibuprofen '600mg'$  every 6 hours only when in severe pain - Orthopedic surgery referral placed - Continue ice/heat/voltaren gel - BMP today

## 2021-11-11 NOTE — Assessment & Plan Note (Signed)
-   Due for colonoscopy, will place referral today - Holding off immunizations today, encouraged [name] to update her vaccinations at her local pharmacy and bring paperwork with her at next appointment.

## 2021-11-14 NOTE — Progress Notes (Signed)
Internal Medicine Clinic Attending ? ?Case discussed with Dr. Braswell  At the time of the visit.  We reviewed the resident?s history and exam and pertinent patient test results.  I agree with the assessment, diagnosis, and plan of care documented in the resident?s note.  ?

## 2021-11-23 ENCOUNTER — Encounter: Payer: Self-pay | Admitting: Gastroenterology

## 2021-11-28 ENCOUNTER — Ambulatory Visit: Payer: Medicare HMO | Admitting: Sports Medicine

## 2021-12-01 ENCOUNTER — Encounter: Payer: Self-pay | Admitting: Gastroenterology

## 2021-12-01 ENCOUNTER — Ambulatory Visit (AMBULATORY_SURGERY_CENTER): Payer: Self-pay | Admitting: *Deleted

## 2021-12-01 VITALS — Ht 64.5 in | Wt 186.0 lb

## 2021-12-01 DIAGNOSIS — Z800 Family history of malignant neoplasm of digestive organs: Secondary | ICD-10-CM

## 2021-12-01 DIAGNOSIS — Z1211 Encounter for screening for malignant neoplasm of colon: Secondary | ICD-10-CM

## 2021-12-01 MED ORDER — PEG 3350-KCL-NA BICARB-NACL 420 G PO SOLR: 4000.0000 mL | Freq: Once | ORAL | 0 refills | Status: AC

## 2021-12-01 NOTE — Progress Notes (Signed)
Patient's pre-visit was done today over the phone with the patient and daughter Amedeo Kinsman here in person also-patient aware. Name,DOB and address verified. Patient denies any allergies to Eggs and Soy. Patient denies any problems with anesthesia/sedation. Patient is not taking any diet pills or blood thinners. No home Oxygen. Insurance confirmed with patient.  Went over prep instructions with patient and daughter-copy given to daughter.Patient understands to call us back with any questions or concerns. Patient is aware of our care-partner policy.

## 2021-12-11 ENCOUNTER — Ambulatory Visit (AMBULATORY_SURGERY_CENTER): Payer: Medicare HMO | Admitting: Gastroenterology

## 2021-12-11 ENCOUNTER — Encounter: Payer: Self-pay | Admitting: Gastroenterology

## 2021-12-11 VITALS — BP 127/72 | HR 77 | Temp 98.0°F | Resp 18 | Ht 64.5 in | Wt 186.0 lb

## 2021-12-11 DIAGNOSIS — K621 Rectal polyp: Secondary | ICD-10-CM | POA: Diagnosis not present

## 2021-12-11 DIAGNOSIS — D123 Benign neoplasm of transverse colon: Secondary | ICD-10-CM

## 2021-12-11 DIAGNOSIS — Z1211 Encounter for screening for malignant neoplasm of colon: Secondary | ICD-10-CM | POA: Diagnosis not present

## 2021-12-11 DIAGNOSIS — I10 Essential (primary) hypertension: Secondary | ICD-10-CM | POA: Diagnosis not present

## 2021-12-11 DIAGNOSIS — D122 Benign neoplasm of ascending colon: Secondary | ICD-10-CM

## 2021-12-11 DIAGNOSIS — Z800 Family history of malignant neoplasm of digestive organs: Secondary | ICD-10-CM

## 2021-12-11 DIAGNOSIS — D128 Benign neoplasm of rectum: Secondary | ICD-10-CM

## 2021-12-11 DIAGNOSIS — I509 Heart failure, unspecified: Secondary | ICD-10-CM | POA: Diagnosis not present

## 2021-12-11 MED ORDER — SODIUM CHLORIDE 0.9 % IV SOLN: 500.0000 mL | Freq: Once | INTRAVENOUS | Status: DC

## 2021-12-11 NOTE — Op Note (Signed)
[facility] Patient Name: Victoria Campos Procedure Date: 12/11/2021 2:36 PM MRN: [id number] Endoscopist: Mauri Pole , MD Age: 71 Referring MD:  Date of Birth: 1950-07-14 Gender: Female Account #: [id number] Procedure:                Colonoscopy Indications:              Screening in patient at increased risk: Family                            history of 1st-degree relative with colorectal                            cancer Medicines:                Monitored Anesthesia Care Procedure:                Pre-Anesthesia Assessment:                           - Prior to the procedure, a History and Physical                            was performed, and patient medications and                            allergies were reviewed. The patient's tolerance of                            previous anesthesia was also reviewed. The risks                            and benefits of the procedure and the sedation                            options and risks were discussed with the patient.                            All questions were answered, and informed consent                            was obtained. Prior Anticoagulants: The patient has                            taken no previous anticoagulant or antiplatelet                            agents. ASA Grade Assessment: II - A patient with                            mild systemic disease. After reviewing the risks                            and benefits, the patient was deemed in  satisfactory condition to undergo the procedure.                           After obtaining informed consent, the colonoscope                            was passed under direct vision. Throughout the                            procedure, the patient's blood pressure, pulse, and                            oxygen saturations were monitored continuously. The                            Olympus PCF-H190DL [id number]) Colonoscope was                             introduced through the anus and advanced to the the                            cecum, identified by appendiceal orifice and                            ileocecal valve. The colonoscopy was performed                            without difficulty. The patient tolerated the                            procedure well. The quality of the bowel                            preparation was good. The ileocecal valve,                            appendiceal orifice, and rectum were photographed. Scope In: 2:54:20 PM Scope Out: 3:07:25 PM Scope Withdrawal Time: 0 hours 10 minutes 30 seconds  Total Procedure Duration: 0 hours 13 minutes 5 seconds  Findings:                 The perianal and digital rectal examinations were                            normal.                           Two sessile polyps were found in the rectum and                            ascending colon. The polyps were 1 to 2 mm in size.                            These polyps were removed with a cold biopsy  forceps. Resection and retrieval were complete.                           Three sessile polyps were found in the transverse                            colon. The polyps were 4 to 6 mm in size. These                            polyps were removed with a cold snare. Resection                            and retrieval were complete.                           Non-bleeding external and internal hemorrhoids were                            found during retroflexion. The hemorrhoids were                            medium-sized. Complications:            No immediate complications. Estimated Blood Loss:     Estimated blood loss was minimal. Impression:               - Two 1 to 2 mm polyps in the rectum and in the                            ascending colon, removed with a cold biopsy                            forceps. Resected and retrieved.                           - Three 4 to 6 mm polyps in  the transverse colon,                            removed with a cold snare. Resected and retrieved.                           - Non-bleeding external and internal hemorrhoids. Recommendation:           - Patient has a contact number available for                            emergencies. The signs and symptoms of potential                            delayed complications were discussed with the                            patient. Return to normal activities tomorrow.  Written discharge instructions were provided to the                            patient.                           - Resume previous diet.                           - Resume previous diet.                           - Continue present medications.                           - Await pathology results.                           - Repeat colonoscopy in 3 - 5 years for                            surveillance based on pathology results. Mauri Pole, MD 12/11/2021 3:15:13 PM This report has been signed electronically.

## 2021-12-11 NOTE — Progress Notes (Signed)
VS completed by DT.  Pt's states no medical or surgical changes since previsit or office visit.  

## 2021-12-11 NOTE — Progress Notes (Signed)
Called to room to assist during endoscopic procedure.  Patient ID and intended procedure confirmed with present staff. Received instructions for my participation in the procedure from the performing physician.  

## 2021-12-11 NOTE — Progress Notes (Signed)
[facility] History and Physical   Primary Care Physician:  Farrel Gordon, DO   Reason for Procedure:  Family history of colon cancer  Plan:    Screening colonoscopy with possible interventions as needed     HPI: Victoria Campos is a very pleasant 71 y.o. female here for screening colonoscopy. Denies any nausea, vomiting, abdominal pain, melena or bright red blood per rectum  The risks and benefits as well as alternatives of endoscopic procedure(s) have been discussed and reviewed. All questions answered. The patient agrees to proceed.    Past Medical History:  Diagnosis Date   ANXIETY 03/10/2004   Qualifier: Diagnosis of  By: Radene Ou MD, [place]     Arthritis    Heart murmur    Hypercholesteremia    Hypertension    Insomnia    Pulmonary embolism ([place]) 09/23/2011   Acute segmental bilateral segmental PE, neg dopplers, neg coag w/u     Past Surgical History:  Procedure Laterality Date   arm surgery     torn ligamint   [place]     removed   COLONOSCOPY  04/2011   Dr.Aggarwal-normal colon prep good   tubal ligation      Prior to Admission medications   Medication Sig Start Date End Date Taking? Authorizing Provider  atorvastatin (LIPITOR) 80 MG tablet TAKE 1 TABLET EVERY DAY 09/28/21  Yes Farrel Gordon, DO  ezetimibe (ZETIA) 10 MG tablet Take 1 tablet (10 mg total) by mouth daily. 02/17/21 02/17/22 Yes Farrel Gordon, DO  furosemide (LASIX) 20 MG tablet TAKE 1 TABLET EVERY DAY 09/14/21  Yes Farrel Gordon, DO  hydrochlorothiazide (HYDRODIURIL) 25 MG tablet Take 1 tablet (25 mg total) by mouth daily. 11/09/20  Yes Lyndal Pulley, MD  HYDROcodone-acetaminophen (NORCO/VICODIN) 5-325 MG tablet Take 0.5-1 tablets by mouth 2 (two) times daily as needed. 10/07/21  Yes Raspet, Erin K, PA-C  losartan (COZAAR) 100 MG tablet Take 1 tablet (100 mg total) by mouth daily. 04/14/21 04/14/22 Yes Orvis Brill, MD  meloxicam (MOBIC) 7.5 MG tablet Take 1 tablet (7.5 mg total) by mouth  daily. 10/10/21  Yes Farrel Gordon, DO  spironolactone (ALDACTONE) 25 MG tablet Take 1 tablet (25 mg total) by mouth daily. 10/10/21 10/10/22 Yes Farrel Gordon, DO  aspirin 81 MG chewable tablet Chew 81 mg by mouth daily. Patient not taking: Reported on 12/11/2021    [provider]  diclofenac Sodium (VOLTAREN) 1 % GEL Apply 4 g topically 4 (four) times daily. 09/06/21   Farrel Gordon, DO  fluticasone [facility]) 50 MCG/ACT nasal spray USE 2 SPRAYS IN [facility] NOSTRIL EVERY DAY 09/28/21   Farrel Gordon, DO  IBU 600 MG tablet TAKE 1 TABLET EVERY 6 HOURS AS NEEDED 09/28/21   Farrel Gordon, DO    Current Outpatient Medications  Medication Sig Dispense Refill   atorvastatin (LIPITOR) 80 MG tablet TAKE 1 TABLET EVERY DAY 90 tablet 1   ezetimibe (ZETIA) 10 MG tablet Take 1 tablet (10 mg total) by mouth daily. 30 tablet 11   furosemide (LASIX) 20 MG tablet TAKE 1 TABLET EVERY DAY 90 tablet 1   hydrochlorothiazide (HYDRODIURIL) 25 MG tablet Take 1 tablet (25 mg total) by mouth daily. 90 tablet 2   HYDROcodone-acetaminophen (NORCO/VICODIN) 5-325 MG tablet Take 0.5-1 tablets by mouth 2 (two) times daily as needed. 4 tablet 0   losartan (COZAAR) 100 MG tablet Take 1 tablet (100 mg total) by mouth daily. 30 tablet 11   meloxicam (MOBIC) 7.5 MG tablet Take  1 tablet (7.5 mg total) by mouth daily. 30 tablet 0   spironolactone (ALDACTONE) 25 MG tablet Take 1 tablet (25 mg total) by mouth daily. 30 tablet 11   aspirin 81 MG chewable tablet Chew 81 mg by mouth daily. (Patient not taking: Reported on 12/11/2021)     diclofenac Sodium (VOLTAREN) 1 % GEL Apply 4 g topically 4 (four) times daily. 150 g 1   fluticasone (FLONASE) 50 MCG/ACT nasal spray USE 2 SPRAYS IN EACH NOSTRIL EVERY DAY 48 g 1   IBU 600 MG tablet TAKE 1 TABLET EVERY 6 HOURS AS NEEDED 90 tablet 1   Current Facility-Administered Medications  Medication Dose Route Frequency Provider Last Rate Last Admin   0.9 %  sodium chloride infusion  500 mL Intravenous Once  Ghazal Pevey, Venia Minks, MD        Allergies as of 12/11/2021 - Review Complete 12/11/2021  Allergen Reaction Noted   Sulfamethoxazole-trimethoprim Rash 10/26/2008    Family History  Problem Relation Age of Onset   Heart attack Father        died at age 39   Heart disease Father    Colon cancer Sister    Cancer Sister        colon   Cancer Sister        breast   Cancer Sister    Colon cancer Brother    Cancer Brother        colon   Esophageal cancer Neg Hx    Stomach cancer Neg Hx     Social History   Socioeconomic History   Marital status: Single    Spouse name: Not on file   Number of children: Not on file   Years of education: Not on file   Highest education level: Not on file  Occupational History   Not on file  Tobacco Use   Smoking status: Former    Types: Cigarettes    Quit date: 03/12/1968    Years since quitting: 53.7   Smokeless tobacco: Never  Vaping Use   Vaping Use: Never used  Substance and Sexual Activity   Alcohol use: Yes    Alcohol/week: 1.0 standard drink of alcohol    Types: 1 Glasses of wine per week    Comment: occasional   Drug use: No   Sexual activity: Not Currently  Other Topics Concern   Not on file  Social History Narrative   Current Social History 09/07/2020        Patient lives with family in a home which is 1 story. There is one step up to the entrance the patient uses.       Patient's method of transportation is personal car and her daughter drives her to appointments as well.      The highest level of education was college diploma.      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      Identified important Relationships are God and her children       Pets : None       Interests / Fun: Traveling with her family; cookouts       Current Stressors: None           Social Determinants of Health   Financial Resource Strain: Low Risk  (10/10/2021)   Overall Financial Resource Strain  (CARDIA)    Difficulty of Paying Living Expenses: Not hard at all  Food Insecurity: No Food Insecurity (  10/10/2021)   Hunger Vital Sign    Worried About Running Out of Food in the Last Year: Never true    [place] in the Last Year: Never true  Transportation Needs: No Transportation Needs (10/10/2021)   PRAPARE - Hydrologist (Medical): No    Lack of Transportation (Non-Medical): No  Physical Activity: Insufficiently Active (10/10/2021)   Exercise Vital Sign    Days of Exercise per Week: 7 days    Minutes of Exercise per Session: 20 min  Stress: No Stress Concern Present (10/10/2021)   [place]    Feeling of Stress : Not at all  Social Connections: Moderately Isolated (10/10/2021)   Social Connection and Isolation Panel [NHANES]    Frequency of Communication with Friends and Family: More than three times a week    Frequency of Social Gatherings with Friends and Family: More than three times a week    Attends Religious Services: More than 4 times per year    Active Member of [employer] or Organizations: No    Attends Archivist Meetings: Never    Marital Status: Divorced  Human resources officer Violence: Not At Risk (10/10/2021)   Humiliation, Afraid, Rape, and Kick questionnaire    Fear of Current or Ex-Partner: No    Emotionally Abused: No    Physically Abused: No    Sexually Abused: No    Review of Systems:  All other review of systems negative except as mentioned in the HPI.  Physical Exam: Vital signs in last 24 hours: Blood Pressure (Abnormal) 148/77   Pulse 94   Temperature 98 F (36.7 C) (Temporal)   Height 5' 4.5" (1.638 m)   Weight 186 lb (84.4 kg)   Oxygen Saturation 98%   Body Mass Index 31.43 kg/m  General:   Alert, NAD Lungs:  Clear .   Heart:  Regular rate and rhythm Abdomen:  Soft, nontender and nondistended. Neuro/Psych:  Alert and cooperative. Normal mood and  affect. A and O x 3  Reviewed labs, radiology imaging, old records and pertinent past GI work up  Patient is appropriate for planned procedure(s) and anesthesia in an ambulatory setting   K. Denzil Magnuson , MD [phone]

## 2021-12-11 NOTE — Patient Instructions (Signed)
-   Patient has a contact number available for emergencies. The signs and symptoms of potential delayed complications were discussed with the patient. Return to normal activities tomorrow. Written discharge instructions were provided to the patient. - Resume previous diet. - Resume previous diet. - Continue present medications. - Await pathology results. - Repeat colonoscopy in 3 - 5 years for surveillance based on pathology results. -Handout on hemorrhoids and polyps provided  YOU HAD AN ENDOSCOPIC PROCEDURE TODAY AT [facility]:   Refer to the procedure report that was given to you for any specific questions about what was found during the examination.  If the procedure report does not answer your questions, please call your gastroenterologist to clarify.  If you requested that your care partner not be given the details of your procedure findings, then the procedure report has been included in a sealed envelope for you to review at your convenience later.  YOU SHOULD EXPECT: Some feelings of bloating in the abdomen. Passage of more gas than usual.  Walking can help get rid of the air that was put into your GI tract during the procedure and reduce the bloating. If you had a lower endoscopy (such as a colonoscopy or flexible sigmoidoscopy) you may notice spotting of blood in your stool or on the toilet paper. If you underwent a bowel prep for your procedure, you may not have a normal bowel movement for a few days.  Please Note:  You might notice some irritation and congestion in your nose or some drainage.  This is from the oxygen used during your procedure.  There is no need for concern and it should clear up in a day or so.  SYMPTOMS TO REPORT IMMEDIATELY:  Following lower endoscopy (colonoscopy or flexible sigmoidoscopy):  Excessive amounts of blood in the stool  Significant tenderness or worsening of abdominal pains  Swelling of the abdomen that is new, acute  Fever of 100F or  higher  For urgent or emergent issues, a gastroenterologist can be reached at any hour by calling [phone]. Do not use MyChart messaging for urgent concerns.    DIET:  We do recommend a small meal at first, but then you may proceed to your regular diet.  Drink plenty of fluids but you should avoid alcoholic beverages for 24 hours.  ACTIVITY:  You should plan to take it easy for the rest of today and you should NOT DRIVE or use heavy machinery until tomorrow (because of the sedation medicines used during the test).    FOLLOW UP: Our staff will call the number listed on your records the next business day following your procedure.  We will call around 7:15- 8:00 am to check on you and address any questions or concerns that you may have regarding the information given to you following your procedure. If we do not reach you, we will leave a message.     If any biopsies were taken you will be contacted by phone or by letter within the next 1-3 weeks.  Please call us at [phone] if you have not heard about the biopsies in 3 weeks.    SIGNATURES/CONFIDENTIALITY: You and/or your care partner have signed paperwork which will be entered into your electronic medical record.  These signatures attest to the fact that that the information above on your After Visit Summary has been reviewed and is understood.  Full responsibility of the confidentiality of this discharge information lies with you and/or your care-partner.

## 2021-12-11 NOTE — Progress Notes (Signed)
Pt resting comfortably. VSS. Airway intact. SBAR complete to RN. All questions answered.   

## 2021-12-12 ENCOUNTER — Ambulatory Visit: Payer: Medicare HMO | Admitting: Sports Medicine

## 2021-12-12 ENCOUNTER — Telehealth: Payer: Self-pay

## 2021-12-12 NOTE — Telephone Encounter (Signed)
  Follow up Call-     12/11/2021    2:19 PM  Call back number  Post procedure Call Back phone  # [phone]  Permission to leave phone message Yes     Patient questions:  Do you have a fever, pain , or abdominal swelling? No. Pain Score  0 *  Have you tolerated food without any problems? Yes.    Have you been able to return to your normal activities? Yes.    Do you have any questions about your discharge instructions: Diet   No. Medications  No. Follow up visit  No.  Do you have questions or concerns about your Care? No.  Actions: * If pain score is 4 or above: No action needed, pain <4.

## 2021-12-13 ENCOUNTER — Encounter: Payer: Self-pay | Admitting: Internal Medicine

## 2021-12-13 ENCOUNTER — Other Ambulatory Visit: Payer: Self-pay | Admitting: Internal Medicine

## 2021-12-13 DIAGNOSIS — I10 Essential (primary) hypertension: Secondary | ICD-10-CM

## 2021-12-13 NOTE — Progress Notes (Unsigned)
Error

## 2021-12-21 ENCOUNTER — Encounter: Payer: Self-pay | Admitting: Gastroenterology

## 2022-01-01 ENCOUNTER — Ambulatory Visit (INDEPENDENT_AMBULATORY_CARE_PROVIDER_SITE_OTHER): Payer: Medicare HMO | Admitting: Sports Medicine

## 2022-01-01 ENCOUNTER — Encounter: Payer: Self-pay | Admitting: Sports Medicine

## 2022-01-01 VITALS — Ht 64.0 in | Wt 184.0 lb

## 2022-01-01 DIAGNOSIS — M1711 Unilateral primary osteoarthritis, right knee: Secondary | ICD-10-CM | POA: Diagnosis not present

## 2022-01-01 DIAGNOSIS — G8929 Other chronic pain: Secondary | ICD-10-CM

## 2022-01-01 DIAGNOSIS — M25561 Pain in right knee: Secondary | ICD-10-CM | POA: Diagnosis not present

## 2022-01-01 NOTE — Progress Notes (Signed)
Victoria Campos - 71 y.o. female MRN [id number]  Date of birth: 24-Mar-1950  Office Visit Note: Visit Date: 01/01/2022 PCP: Victoria Gordon, DO Referred by: Victoria Campos  Subjective: Chief Complaint  Patient presents with   Right Knee - Pain   HPI: Victoria Campos is a pleasant 71 y.o. female who presents today for acute on chronic right knee pain.  She has had pain for many years of the right knee, however over the last 3 to 6 months her pain has worsened and she is now getting send giving way of the knee.  She reports intermittent recurrent swelling and pain which we will treat with rest, elevation and icing.  She does wear a knee support which does help to some extent.  Ibuprofen taken as needed.  She has never had physical therapy or injection therapy yet to date.  -No tobacco use; about 2 glasses of wine weekly -No diabetes, CAD -BMI: Body mass index is 31.58 kg/m.  Pertinent ROS were reviewed with the patient and found to be negative unless otherwise specified above in HPI.   Assessment & Plan: Visit Diagnoses:  1. Unilateral primary osteoarthritis, right knee   2. Chronic pain of right knee    Plan: Reviewed Victoria Campos's previous knee x-ray and discussed the etiology of her knee pain.  She does have rather advanced medial joint space collapse and patellar fragmentation.  She seems most bothersome in the knee joint with her severe medial knee OA.  We discussed all treatment options, as she has not had much yet today other than oral therapy.  She wants to get into formalized physical therapy, referral sent off today and we did give her home exercises to perform in the meantime.  May take ibuprofen as needed, topical Voltaren gel.  Did discuss the role of injection therapy, she would like to hold off for this and follow-up in about 4-6 weeks for reevaluation.  Did discuss she would be a candidate for total knee replacement if all other conservative measures fail, however we will start  with these first.  Follow-up: Return in about 5 weeks (around 02/05/2022).   Meds & Orders: No orders of the defined types were placed in this encounter.   Orders Placed This Encounter  Procedures   Ambulatory referral to Physical Therapy     Procedures: No procedures performed      Clinical History: No specialty comments available.  She reports that she quit smoking about 53 years ago. Her smoking use included cigarettes. She has never used smokeless tobacco.  Recent Labs    10/10/21 1107  HGBA1C 5.3    Objective:   Vital Signs: Ht '5\' 4"'$  (1.626 m)   Wt 184 lb (83.5 kg)   BMI 31.58 kg/m   Physical Exam  Gen: Well-appearing, in no acute distress; non-toxic CV: Well-perfused. Warm.  Resp: Breathing unlabored on room air; no wheezing. Psych: Fluid speech in conversation; appropriate affect; normal thought process Neuro: Sensation intact throughout. No gross coordination deficits.   Ortho Exam -Right knee: There is a mild effusion on the knee without significant warmth or redness.  There is positive TTP over the medial joint line.  There is patellar crepitus through knee flexion and extension.  Range of motion from 0-125 degrees.  No varus or valgus instability.  Some pain with resisted knee extension, otherwise full strength.  Neurovascular intact distally.  Imaging:  *Knee x-ray from 10/07/2021 was independently reviewed by myself.  X-rays show essentially bone-on-bone  medial joint space collapse.  There is fragmentation of bilateral femoral condyles as well as advanced fragmentation of the superior patellofemoral joint.  There may be a small break in the osteophyte off the superior patellar pole.  No other acute fracture or bony abnormality noted.  DG Knee Complete 4 Views Right CLINICAL DATA:  Pain and difficulty bearing weight. Personal history of gout.  EXAM: RIGHT KNEE - COMPLETE 4+ VIEW  COMPARISON:  Right knee radiographs 10/13/2006  FINDINGS: Progressive  degenerative changes are present in the medial and patellar from femoral component. Fragmentation at the superior patella has progressed. No effusion to suggest acute abnormality.  IMPRESSION: 1. Progressive degenerative changes in the medial and patellar compartments. 2. Fragmentation at the superior patella has progressed.  Electronically Signed   By: Victoria Campos M.D.   On: 10/07/2021 12:11    Past Medical/Family/Surgical/Social History: Medications & Allergies reviewed per EMR, new medications updated. Patient Active Problem List   Diagnosis Date Noted   Need for hepatitis C screening test 10/10/2021   Encounter for screening colonoscopy 10/10/2021   Encounter for screening for diabetes mellitus 10/10/2021   Insomnia 04/14/2021   Chronic heart failure with preserved ejection fraction ([place]) 09/29/2018   Encounter for screening mammogram for breast cancer 09/29/2018   Chest pain 09/05/2017   Cerumen impaction 02/16/2015   Essential hypertension 10/15/2014   Osteoarthritis of both knees 10/15/2014   Osteoarthritis of back 01/27/2014   Healthcare maintenance 09/17/2013   Myopia of left eye 03/20/2013   Sensorineural hearing loss 12/27/2009   TINNITUS 03/14/2009   Left shoulder pain 04/20/2008   BENIGN PAROXYSMAL POSITIONAL VERTIGO 10/21/2007   ALLERGIC RHINITIS, SEASONAL 08/24/2004   Hyperlipidemia 04/21/2004   DEPRESSION 03/10/2004   Past Medical History:  Diagnosis Date   ANXIETY 03/10/2004   Qualifier: Diagnosis of  By: Radene Ou MD, [place]     Arthritis    Heart murmur    Hypercholesteremia    Hypertension    Insomnia    Pulmonary embolism ([place]) 09/23/2011   Acute segmental bilateral segmental PE, neg dopplers, neg coag w/u    Family History  Problem Relation Age of Onset   Heart attack Father        died at age 48   Heart disease Father    Colon cancer Sister    Cancer Sister        colon   Cancer Sister        breast   Cancer Sister     Colon cancer Brother    Cancer Brother        colon   Esophageal cancer Neg Hx    Stomach cancer Neg Hx    Past Surgical History:  Procedure Laterality Date   arm surgery     torn ligamint   bunyon     removed   COLONOSCOPY  04/2011   Dr.Aggarwal-normal colon prep good   tubal ligation     Social History   Occupational History   Not on file  Tobacco Use   Smoking status: Former    Types: Cigarettes    Quit date: 03/12/1968    Years since quitting: 53.8   Smokeless tobacco: Never  Vaping Use   Vaping Use: Never used  Substance and Sexual Activity   Alcohol use: Yes    Alcohol/week: 1.0 standard drink of alcohol    Types: 1 Glasses of wine per week    Comment: occasional   Drug use: No   Sexual activity: Not  Currently

## 2022-01-01 NOTE — Patient Instructions (Signed)
Majorie, it was great to meet you today, thank you for letting me participate in your care!  Today, we discussed your right knee pain - this does show advanced osteoarthritis of the knee.   Things we will do for this: -Begin formalized physical therapy to strengthen the knee and the muscles around the knee -You may take over-the-counter ibuprofen as needed -Maintain healthy weight, given the loss of 5-10 pounds can help offload the knee and improve your pain -When your knee is swollen, recommend icing it 3 times a day for 15 minutes, resting and elevating -A corticosteroid knee injection can always be considered which will help with pain and swelling  Supplements that can help with knee arthritis: -Turmeric, glucosamine-chondroitin, omega-3 fatty acids (foods include fish, soy, cherries, citrus fruits) -Make sure you are staying hydrated with water  You will follow-up with me in about 5-6 weeks.  If you have any further questions, please give the clinic a call [phone].  Elba Barman, DO Primary Care Sports Medicine Physician  [facility]

## 2022-01-01 NOTE — Progress Notes (Signed)
Right knee pain; feeling of giving out, can barely walk/put pressure on it This has been ongoing for months. X-rays were taken in July Denies any type of injection/PT Ibuprofen for pain; helps some Minimal swelling today Does wear a knee brace on the affected knee; states it helps some

## 2022-01-08 ENCOUNTER — Ambulatory Visit: Payer: Medicare HMO | Admitting: Physical Therapy

## 2022-01-08 NOTE — Therapy (Incomplete)
OUTPATIENT PHYSICAL THERAPY LOWER EXTREMITY EVALUATION   Patient Name: Victoria Campos MRN: [id number] DOB:1951/01/22, 71 y.o., female Today's Date: 01/08/2022    Past Medical History:  Diagnosis Date   ANXIETY 03/10/2004   Qualifier: Diagnosis of  By: Radene Ou MD, [place]     Arthritis    Heart murmur    Hypercholesteremia    Hypertension    Insomnia    Pulmonary embolism ([place]) 09/23/2011   Acute segmental bilateral segmental PE, neg dopplers, neg coag w/u    Past Surgical History:  Procedure Laterality Date   arm surgery     torn ligamint   bunyon     removed   COLONOSCOPY  04/2011   Dr.Aggarwal-normal colon prep good   tubal ligation     Patient Active Problem List   Diagnosis Date Noted   Need for hepatitis C screening test 10/10/2021   Encounter for screening colonoscopy 10/10/2021   Encounter for screening for diabetes mellitus 10/10/2021   Insomnia 04/14/2021   Chronic heart failure with preserved ejection fraction ([place]) 09/29/2018   Encounter for screening mammogram for breast cancer 09/29/2018   Chest pain 09/05/2017   Cerumen impaction 02/16/2015   Essential hypertension 10/15/2014   Osteoarthritis of both knees 10/15/2014   Osteoarthritis of back 01/27/2014   Healthcare maintenance 09/17/2013   Myopia of left eye 03/20/2013   Sensorineural hearing loss 12/27/2009   TINNITUS 03/14/2009   Left shoulder pain 04/20/2008   BENIGN PAROXYSMAL POSITIONAL VERTIGO 10/21/2007   ALLERGIC RHINITIS, SEASONAL 08/24/2004   Hyperlipidemia 04/21/2004   DEPRESSION 03/10/2004    PCP: Farrel Gordon, DO  REFERRING PROVIDER: Elba Barman, DO   REFERRING DIAG: [phone] (ICD-10-CM) - Chronic pain of right knee   THERAPY DIAG:  No diagnosis found.  Rationale for Evaluation and Treatment: Rehabilitation  ONSET DATE: ***  SUBJECTIVE:   SUBJECTIVE STATEMENT: ***  PERTINENT HISTORY: *** PAIN:  Are you having pain? Yes: {yespain:27235::"NPRS scale:  ***/10","Pain location: ***","Pain description: ***","Aggravating factors: ***","Relieving factors: ***"}  PRECAUTIONS: None  WEIGHT BEARING RESTRICTIONS: No  FALLS:  Has patient fallen in last 6 months? {fallsyesno:27318}  LIVING ENVIRONMENT: Lives with: {[facility] lives with:25569::"lives with their family"} Lives in: {Lives in:[address]70} Stairs: {opstairs:27293} Has following equipment at home: {Assistive devices:23999}  OCCUPATION: ***  PLOF: {PLOF:24004}  PATIENT GOALS: ***   OBJECTIVE:   DIAGNOSTIC FINDINGS: ***  PATIENT SURVEYS:  01/08/22: FOTO ***  COGNITION: Overall cognitive status: {cognition:24006}     SENSATION: {sensation:27233}  EDEMA:  {edema:24020}  MUSCLE LENGTH: Hamstrings: Right *** deg; Left *** deg Victoria Campos test: Right *** deg; Left *** deg  POSTURE: {posture:25561}  PALPATION: 01/08/22: ***  LOWER EXTREMITY ROM:  {AROM/PROM:27142} ROM Right eval Left eval  Hip flexion    Hip extension    Hip abduction    Hip adduction    Hip internal rotation    Hip external rotation    Knee flexion    Knee extension    Ankle dorsiflexion    Ankle plantarflexion    Ankle inversion    Ankle eversion     (Blank rows = not tested)  LOWER EXTREMITY MMT:  MMT Right eval Left eval  Hip flexion    Hip extension    Hip abduction    Hip adduction    Hip internal rotation    Hip external rotation    Knee flexion    Knee extension    Ankle dorsiflexion    Ankle plantarflexion    Ankle inversion  Ankle eversion     (Blank rows = not tested)  LOWER EXTREMITY SPECIAL TESTS:  {LEspecialtests:26242}  FUNCTIONAL TESTS:  {Functional tests:24029}  GAIT: Distance walked: *** Assistive device utilized: {Assistive devices:23999} Level of assistance: {Levels of assistance:24026} Comments: ***   TODAY'S TREATMENT:                                                                                                                              DATE:  01/08/22  ***   PATIENT EDUCATION:  Education details: *** Person educated: {Person educated:25204} Education method: {Education Method:25205} Education comprehension: {Education Comprehension:25206}  HOME EXERCISE PROGRAM: ***  ASSESSMENT:  CLINICAL IMPRESSION: [name] who was seen today for physical therapy evaluation and treatment for chronic Rt knee pain.  She demonstrates ***.  She will benefit from PT to address deficits listed.   OBJECTIVE IMPAIRMENTS: {opptimpairments:25111}.   ACTIVITY LIMITATIONS: {activitylimitations:27494}  PARTICIPATION LIMITATIONS: {participationrestrictions:25113}  PERSONAL FACTORS: {Personal factors:25162} are also affecting patient's functional outcome.   REHAB POTENTIAL: {rehabpotential:25112}  CLINICAL DECISION MAKING: {clinical decision making:25114}  EVALUATION COMPLEXITY: {Evaluation complexity:25115}   GOALS: Goals reviewed with patient? {yes/no:20286}  SHORT TERM GOALS: Target date: {follow up:25551}  Independent with initial HEP Goal status: INITIAL  2.  *** Goal status: {GOALSTATUS:25110}  3.  *** Goal status: {GOALSTATUS:25110}  4.  *** Goal status: {GOALSTATUS:25110}  5.  *** Goal status: {GOALSTATUS:25110}  6.  *** Goal status: {GOALSTATUS:25110}  LONG TERM GOALS: Target date: {follow up:25551}  Independent with final HEP Goal status: INITIAL  2.  FOTO score improved to *** Goal status: INITIAL  3.  *** improved to *** for *** Goal status: INIITAL  4.  Report pain < ***/10 with *** for improved function Goal status: INITIAL  5.  *** Goal status: {GOALSTATUS:25110}  6.  *** Goal status: {GOALSTATUS:25110}    PLAN:  PT FREQUENCY: {rehab frequency:25116}  PT DURATION: {rehab duration:25117}  PLANNED INTERVENTIONS: {rehab planned interventions:25118::"Therapeutic exercises","Therapeutic activity","Neuromuscular re-education","Balance training","Gait  training","Patient/Family education","Self Care","Joint mobilization"}  PLAN FOR NEXT SESSION: ***   Laureen Abrahams, PT, DPT 01/08/22 7:35 AM

## 2022-01-25 ENCOUNTER — Ambulatory Visit: Payer: Medicare HMO | Admitting: Physical Therapy

## 2022-01-25 NOTE — Therapy (Incomplete)
OUTPATIENT PHYSICAL THERAPY LOWER EXTREMITY EVALUATION   Patient Name: Victoria Campos MRN: [id number] DOB:[birth year], 71 y.o., female Today's Date: 01/25/2022  END OF SESSION:   Past Medical History:  Diagnosis Date   ANXIETY 03/10/2004   Qualifier: Diagnosis of  By: Radene Ou MD, [place]     Arthritis    Heart murmur    Hypercholesteremia    Hypertension    Insomnia    Pulmonary embolism ([place]) 09/23/2011   Acute segmental bilateral segmental PE, neg dopplers, neg coag w/u    Past Surgical History:  Procedure Laterality Date   arm surgery     torn ligamint   bunyon     removed   COLONOSCOPY  04/2011   Dr.Aggarwal-normal colon prep good   tubal ligation     Patient Active Problem List   Diagnosis Date Noted   Need for hepatitis C screening test 10/10/2021   Encounter for screening colonoscopy 10/10/2021   Encounter for screening for diabetes mellitus 10/10/2021   Insomnia 04/14/2021   Chronic heart failure with preserved ejection fraction ([place]) 09/29/2018   Encounter for screening mammogram for breast cancer 09/29/2018   Chest pain 09/05/2017   Cerumen impaction 02/16/2015   Essential hypertension 10/15/2014   Osteoarthritis of both knees 10/15/2014   Osteoarthritis of back 01/27/2014   Healthcare maintenance 09/17/2013   Myopia of left eye 03/20/2013   Sensorineural hearing loss 12/27/2009   TINNITUS 03/14/2009   Left shoulder pain 04/20/2008   BENIGN PAROXYSMAL POSITIONAL VERTIGO 10/21/2007   ALLERGIC RHINITIS, SEASONAL 08/24/2004   Hyperlipidemia 04/21/2004   DEPRESSION 03/10/2004    PCP: Farrel Gordon, DO  REFERRING PROVIDER: Elba Barman, DO   REFERRING DIAG: [phone] (ICD-10-CM) - Chronic pain of right knee   THERAPY DIAG:  No diagnosis found.  Rationale for Evaluation and Treatment: Rehabilitation  ONSET DATE: ***  SUBJECTIVE:   SUBJECTIVE STATEMENT: ***  PERTINENT HISTORY: Anxiety, OA, HTN, hx PE, depression  PAIN:  Are you  having pain? {OPRCPAIN:27236}  PRECAUTIONS: {Therapy precautions:24002}  WEIGHT BEARING RESTRICTIONS: No  FALLS:  Has patient fallen in last 6 months? {fallsyesno:27318}  LIVING ENVIRONMENT: Lives with: {[facility] lives with:25569::"lives with their family"} Lives in: {Lives in:[address]} Stairs: {opstairs:27293} Has following equipment at home: {Assistive devices:23999}  OCCUPATION: ***  PLOF: {PLOF:24004}  PATIENT GOALS: ***  NEXT MD VISIT: 02/05/22  OBJECTIVE:   DIAGNOSTIC FINDINGS: 1. Progressive degenerative changes in the medial and patellar compartments. 2. Fragmentation at the superior patella has progressed.  PATIENT SURVEYS:  01/25/22: FOTO ***  COGNITION: Overall cognitive status: Within functional limits for tasks assessed     SENSATION: {sensation:27233}  EDEMA:  {edema:24020}  MUSCLE LENGTH: Hamstrings: Right *** deg; Left *** deg Marcello Moores test: Right *** deg; Left *** deg  POSTURE: {posture:25561}  PALPATION: ***  LOWER EXTREMITY ROM:  {AROM/PROM:27142} ROM Right eval Left eval  Hip flexion    Hip extension    Hip abduction    Hip adduction    Hip internal rotation    Hip external rotation    Knee flexion    Knee extension    Ankle dorsiflexion    Ankle plantarflexion    Ankle inversion    Ankle eversion     (Blank rows = not tested)  LOWER EXTREMITY MMT:  MMT Right eval Left eval  Hip flexion    Hip extension    Hip abduction    Hip adduction    Hip internal rotation    Hip external rotation    Knee  flexion    Knee extension    Ankle dorsiflexion    Ankle plantarflexion    Ankle inversion    Ankle eversion     (Blank rows = not tested)  LOWER EXTREMITY SPECIAL TESTS:  {LEspecialtests:26242}  FUNCTIONAL TESTS:  {Functional tests:24029}  GAIT: Distance walked: *** Assistive device utilized: {Assistive devices:23999} Level of assistance: {Levels of assistance:24026} Comments: ***   TODAY'S TREATMENT:                                                                                                                               DATE: 01/25/22    PATIENT EDUCATION:  Education details: HEP Person educated: Patient Education method: Consulting civil engineer, Media planner, and Handouts Education comprehension: verbalized understanding, returned demonstration, and needs further education  HOME EXERCISE PROGRAM: ***  ASSESSMENT:  CLINICAL IMPRESSION: [name] who was seen today for physical therapy evaluation and treatment for chronic Rt knee pain with known OA.  She demonstrates ***.  She will benefit from PT to address deficits listed.    OBJECTIVE IMPAIRMENTS: {opptimpairments:25111}.   ACTIVITY LIMITATIONS: {activitylimitations:27494}  PARTICIPATION LIMITATIONS: {participationrestrictions:25113}  PERSONAL FACTORS: 3+ comorbidities: Anxiety, OA, HTN, hx PE, depression  are also affecting patient's functional outcome.   REHAB POTENTIAL: {rehabpotential:25112}  CLINICAL DECISION MAKING: {clinical decision making:25114}  EVALUATION COMPLEXITY: {Evaluation complexity:25115}   GOALS: Goals reviewed with patient? Yes  SHORT TERM GOALS: Target date: 02/15/2022  Independent with initial HEP Goal status: INITIAL  2.  *** Goal status: {GOALSTATUS:25110}  3.  *** Goal status: {GOALSTATUS:25110}  4.  *** Goal status: {GOALSTATUS:25110}  5.  *** Goal status: {GOALSTATUS:25110}  6.  *** Goal status: {GOALSTATUS:25110}  LONG TERM GOALS: Target date: 03/08/2022  Independent with final HEP Goal status: INITIAL  2.  FOTO score improved to *** Goal status: INITIAL  3.  *** improved to *** for *** Goal status: INIITAL  4.  Report pain < ***/10 with *** for improved function Goal status: INITIAL  5.  *** Goal status: {GOALSTATUS:25110}  6.  *** Goal status: {GOALSTATUS:25110}    PLAN:  PT FREQUENCY: {rehab frequency:25116}  PT DURATION: {rehab  duration:25117}  PLANNED INTERVENTIONS: {rehab planned interventions:25118::"Therapeutic exercises","Therapeutic activity","Neuromuscular re-education","Balance training","Gait training","Patient/Family education","Self Care","Joint mobilization"}  PLAN FOR NEXT SESSION: Faustino Congress, PT 01/25/2022, 11:16 AM

## 2022-02-05 ENCOUNTER — Ambulatory Visit: Payer: Medicare HMO | Admitting: Sports Medicine

## 2022-02-09 ENCOUNTER — Encounter: Payer: Self-pay | Admitting: Internal Medicine

## 2022-02-09 ENCOUNTER — Other Ambulatory Visit: Payer: Self-pay

## 2022-02-09 ENCOUNTER — Ambulatory Visit (INDEPENDENT_AMBULATORY_CARE_PROVIDER_SITE_OTHER): Payer: Medicare HMO | Admitting: Internal Medicine

## 2022-02-09 VITALS — BP 141/69 | HR 73 | Temp 98.3°F | Ht 64.5 in | Wt 185.5 lb

## 2022-02-09 DIAGNOSIS — I10 Essential (primary) hypertension: Secondary | ICD-10-CM | POA: Diagnosis not present

## 2022-02-09 DIAGNOSIS — M170 Bilateral primary osteoarthritis of knee: Secondary | ICD-10-CM

## 2022-02-09 DIAGNOSIS — Z87891 Personal history of nicotine dependence: Secondary | ICD-10-CM | POA: Diagnosis not present

## 2022-02-09 DIAGNOSIS — H6123 Impacted cerumen, bilateral: Secondary | ICD-10-CM | POA: Diagnosis not present

## 2022-02-09 DIAGNOSIS — E7800 Pure hypercholesterolemia, unspecified: Secondary | ICD-10-CM | POA: Diagnosis not present

## 2022-02-09 MED ORDER — SPIRONOLACTONE 50 MG PO TABS: 50.0000 mg | ORAL_TABLET | Freq: Every day | ORAL | 11 refills | Status: DC

## 2022-02-09 NOTE — Patient Instructions (Addendum)
Ms. Lampron,  It was great to see you today. I am glad that you are doing well!  I am going to increase your spironolactone to 50 mg daily. Otherwise I am making no other changes to your medications.   Please plan to follow up in about 1 months for blood pressure check, or sooner if needed.  My best, Dr. Marlou Sa

## 2022-02-09 NOTE — Assessment & Plan Note (Signed)
Patient has had increased pain particularly to her R knee over the last several months that is minimally responsive to conservative therapies such as meloxicam, ibuprofen, voltaren gel, and heat/ice. She was seen by orthopedic surgery on 10/23 and at the conclusion of that visit, the decision was made to refer her to rehabilitation and re-evaluate in 4-6 weeks. She wishes to hold off from intraarticular injections and was made aware that she is a good operative candidate if she makes the decision to pursue that route if conservative measures fail. She has not yet had a session with rehabilitation and has her first appointment on 12/06 as her initial appointments were cancelled as she was sick. Plan:Continue ibuprofen as needed for pain, topical voltaren gel, heat or ice, rehabilitation, and following with orthopedic surgery.

## 2022-02-09 NOTE — Assessment & Plan Note (Signed)
HTN: BP slightly above goal, 141/70, with recheck of 141/69. Current regimen is HCTZ 25 mg daily, losartan 100 mg daily, spironolactone 25 mg daily. Plan:Increase spironolactone to 50 mg daily. Continue HCTZ 25 mg daily, losartan 100 mg daily. Follow up in 1 month for BP check and BMP.

## 2022-02-09 NOTE — Assessment & Plan Note (Signed)
Bilateral cerumen impaction present on exam today.  Plan: Will flush her ears prior to her leaving clinic today. She had to cancel her appointment with audiology due to being sick with the flu and intends to call to reschedule that appointment today.

## 2022-02-09 NOTE — Progress Notes (Signed)
   CC: BP follow-up  HPI:  [name] is a 71 y.o. person with past medical history as detailed below who presents today for BP follow-up. She is also requesting her ears to be flushed due to cerumen impaction. Please see problem based charting for detailed assessment and plan.  Past Medical History:  Diagnosis Date   ANXIETY 03/10/2004   Qualifier: Diagnosis of  By: Radene Ou MD, [place]     Arthritis    Heart murmur    Hypercholesteremia    Hypertension    Insomnia    Pulmonary embolism ([place]) 09/23/2011   Acute segmental bilateral segmental PE, neg dopplers, neg coag w/u    Review of Systems:  Negative unless otherwise stated.  Physical Exam:  Vitals:   02/09/22 1031 02/09/22 1103  BP: (!) 141/70 (!) 141/69  Pulse: 73   Temp: 98.3 F (36.8 C)   TempSrc: Oral   SpO2: 98%   Weight: 185 lb 8 oz (84.1 kg)   Height: 5' 4.5" (1.638 m)    Constitutional:Appears stated age, well, in no acute distress. Cardio:Regular rate and rhythm. No murmurs, rubs, or gallops. Pulm:Clear to auscultation bilaterally. Normal work of breathing on room air. JJK:KXFGHWE edema, non-pitting, of bilateral lower extremities. Skin:Warm and dry. Neuro:Alert and oriented x3. No focal deficit noted. Psych:Pleasant mood and affect.  Assessment & Plan:   See Encounters Tab for problem based charting.  Essential hypertension HTN: BP slightly above goal, 141/70, with recheck of 141/69. Current regimen is HCTZ 25 mg daily, losartan 100 mg daily, spironolactone 25 mg daily. Plan:Increase spironolactone to 50 mg daily. Continue HCTZ 25 mg daily, losartan 100 mg daily. Follow up in 1 month for BP check and BMP.  Osteoarthritis of both knees Patient has had increased pain particularly to her R knee over the last several months that is minimally responsive to conservative therapies such as meloxicam, ibuprofen, voltaren gel, and heat/ice. She was seen by orthopedic surgery on 10/23 and at the conclusion  of that visit, the decision was made to refer her to rehabilitation and re-evaluate in 4-6 weeks. She wishes to hold off from intraarticular injections and was made aware that she is a good operative candidate if she makes the decision to pursue that route if conservative measures fail. She has not yet had a session with rehabilitation and has her first appointment on 12/06 as her initial appointments were cancelled as she was sick. Plan:Continue ibuprofen as needed for pain, topical voltaren gel, heat or ice, rehabilitation, and following with orthopedic surgery.  Cerumen impaction Bilateral cerumen impaction present on exam today.  Plan: Will flush her ears prior to her leaving clinic today. She had to cancel her appointment with audiology due to being sick with the flu and intends to call to reschedule that appointment today.  Hyperlipidemia Current regimen is atorvastatin 80 mg daily, zetia 10 mg daily. Plan:Continue current regimen. Please check lipid panel at next OV in one month.  Patient discussed with Dr. Philipp Ovens

## 2022-02-09 NOTE — Assessment & Plan Note (Signed)
Current regimen is atorvastatin 80 mg daily, zetia 10 mg daily. Plan:Continue current regimen. Please check lipid panel at next OV in one month.

## 2022-02-13 ENCOUNTER — Other Ambulatory Visit: Payer: Self-pay | Admitting: Internal Medicine

## 2022-02-14 ENCOUNTER — Other Ambulatory Visit: Payer: Self-pay

## 2022-02-14 ENCOUNTER — Encounter: Payer: Self-pay | Admitting: Rehabilitative and Restorative Service Providers"

## 2022-02-14 ENCOUNTER — Ambulatory Visit: Payer: Medicare HMO | Admitting: Rehabilitative and Restorative Service Providers"

## 2022-02-14 DIAGNOSIS — R262 Difficulty in walking, not elsewhere classified: Secondary | ICD-10-CM

## 2022-02-14 DIAGNOSIS — M6281 Muscle weakness (generalized): Secondary | ICD-10-CM | POA: Diagnosis not present

## 2022-02-14 DIAGNOSIS — M25561 Pain in right knee: Secondary | ICD-10-CM | POA: Diagnosis not present

## 2022-02-14 DIAGNOSIS — G8929 Other chronic pain: Secondary | ICD-10-CM

## 2022-02-14 NOTE — Therapy (Signed)
OUTPATIENT PHYSICAL THERAPY EVALUATION   Patient Name: Victoria Campos MRN: [id number] DOB:1950/05/15, 71 y.o., female Today's Date: 02/14/2022  END OF SESSION:  PT End of Session - 02/14/22 1551     Visit Number 1    Number of Visits 20    Date for PT Re-Evaluation 04/25/22    Authorization Type [employer] $10 copay    Authorization - Number of Visits 12    PT Start Time 1607    PT Stop Time 3710    PT Time Calculation (min) 29 min    Activity Tolerance Patient tolerated treatment well    Behavior During Therapy [address] for tasks assessed/performed             Past Medical History:  Diagnosis Date   ANXIETY 03/10/2004   Qualifier: Diagnosis of  By: Radene Ou MD, [place]     Arthritis    Heart murmur    Hypercholesteremia    Hypertension    Insomnia    Pulmonary embolism ([place]) 09/23/2011   Acute segmental bilateral segmental PE, neg dopplers, neg coag w/u    Past Surgical History:  Procedure Laterality Date   arm surgery     torn ligamint   [place]     removed   COLONOSCOPY  04/2011   Dr.Aggarwal-normal colon prep good   tubal ligation     Patient Active Problem List   Diagnosis Date Noted   Insomnia 04/14/2021   Chronic heart failure with preserved ejection fraction ([place]) 09/29/2018   Cerumen impaction 02/16/2015   Essential hypertension 10/15/2014   Osteoarthritis of both knees 10/15/2014   Osteoarthritis of back 01/27/2014   Myopia of left eye 03/20/2013   Sensorineural hearing loss 12/27/2009   TINNITUS 03/14/2009   Left shoulder pain 04/20/2008   BENIGN PAROXYSMAL POSITIONAL VERTIGO 10/21/2007   ALLERGIC RHINITIS, SEASONAL 08/24/2004   Hyperlipidemia 04/21/2004   DEPRESSION 03/10/2004    PCP: Farrel Gordon DO  REFERRING PROVIDER: Elba Barman, DO  REFERRING DIAG: [phone] (ICD-10-CM) - Chronic pain of right knee  THERAPY DIAG:  Chronic pain of right knee  Muscle weakness (generalized)  Difficulty in walking, not elsewhere  classified  Rationale for Evaluation and Treatment: Rehabilitation  ONSET DATE: "years" but worse in last year since 2022  SUBJECTIVE:   SUBJECTIVE STATEMENT: Pt indicated complaints of pain in Rt knee.  Worse with weather. Pt indicated working in house more or caring for kids at work.  Pt indicated rest helps some.  Trouble with squatting or bending. Pt indicated going up stairs is troublesome.  Pt indicated having trouble sleeping due to symptoms. Wears copper fit brace that helps some.   PERTINENT HISTORY: Medical hx: HTN, history of PE, hypercholestermia, OA  PAIN:  NPRS scale: at current  0/10  at worst 9/10 Pain location: Rt knee  Pain description: pressure, rubbing Aggravating factors: increased WB, kneeling, squatting, stairs Relieving factors: rest  PRECAUTIONS: None  WEIGHT BEARING RESTRICTIONS: No  FALLS:  Has patient fallen in last 6 months? No Reported sometimes having a fear of falling.   LIVING ENVIRONMENT: Lives with: grandchildren (17, 4) Lives in: House/apartment Stairs: no stairs   , daughters house does.    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  Noted tenderness in Rt lateral hip/thigh  LOWER EXTREMITY ROM:   ROM Right 02/14/2022 Left 02/14/2022  Hip flexion    Hip extension    Hip abduction    Hip adduction    Hip internal rotation    Hip external rotation    Knee flexion 101 AROM in heel slide supine c pain 116 AROM in heel  slide supine c pain  Knee extension -10 AROM in seated LAQ -0 AROM in seated LAQ  Ankle dorsiflexion    Ankle plantarflexion    Ankle inversion    Ankle eversion     (Blank rows = not tested)  LOWER EXTREMITY MMT:  MMT Right 02/14/2022 Left 02/14/2022  Hip flexion 4/5 5/5  Hip extension    Hip abduction    Hip adduction    Hip internal rotation    Hip external rotation    Knee flexion 5/5 5/5  Knee extension 4/5 c pain 5/5  Ankle dorsiflexion 5/5 5/5  Ankle plantarflexion    Ankle inversion    Ankle eversion     (Blank rows = not tested)  LOWER EXTREMITY SPECIAL TESTS:  02/14/2022 No specific testing  FUNCTIONAL TESTS:  02/14/2022 18 inch chair transfer: Able to perform on 1st try without UE but harder and Rt knee pain.  Lt SLS: 4 seconds Rt SLS: < 3 seconds c pain noted in testing  GAIT: 02/14/2022 Independent ambulation c copper fit brace on Rt knee.  Noted reduced stance on Rt and decreased gait speed.  Antalgic gait.    TODAY'S TREATMENT                                                                          DATE:02/14/2022 Therex:    HEP instruction/performance c cues for techniques, handout provided.  Trial set performed of each for comprehension and symptom assessment.  See below for exercise list  PATIENT EDUCATION:  02/14/2022 Education details: HEP, POC Person educated: Patient Education method: Explanation, Demonstration, Verbal cues, and Handouts Education comprehension: verbalized understanding, returned demonstration, and verbal cues required  HOME EXERCISE PROGRAM: Access Code: [email or web address] URL: [email or web address] Date: 02/14/2022 Prepared by: Scot Jun  Exercises - Supine Heel Slide  - 3-5 x daily - 7 x weekly - 1-2 sets - 10 reps - 2 hold - Seated Long Arc Quad  - 3-5 x daily - 7 x weekly - 1-2 sets - 10 reps - 2 hold - Seated Quad Set  - 3-5 x daily - 7 x weekly - 1 sets - 10 reps - 5 hold - Seated Straight Leg Heel Taps  -  1-2 x daily - 7 x weekly - 1-2 sets - 5-10 reps  ASSESSMENT:  CLINICAL IMPRESSION: Patient is a 71 y.o. who comes to clinic with complaints of Rt knee pain with mobility, strength and movement coordination deficits that impair their ability to perform usual daily and recreational functional activities without increase difficulty/symptoms at this time.  Patient to benefit from skilled PT services to address  impairments and limitations to improve to previous level of function without restriction secondary to condition.   OBJECTIVE IMPAIRMENTS: Abnormal gait, decreased activity tolerance, decreased balance, decreased coordination, decreased endurance, decreased mobility, difficulty walking, decreased ROM, decreased strength, hypomobility, increased fascial restrictions, impaired perceived functional ability, impaired flexibility, improper body mechanics, and pain.   ACTIVITY LIMITATIONS: carrying, lifting, bending, standing, squatting, sleeping, stairs, transfers, and locomotion level  PARTICIPATION LIMITATIONS: meal prep, cleaning, laundry, interpersonal relationship, shopping, community activity, and occupation  PERSONAL FACTORS: Time since onset of injury/illness/exacerbation and Medical hx: HTN, history of PE, hypercholestermia, OA  are also affecting patient's functional outcome.   REHAB POTENTIAL: Good  CLINICAL DECISION MAKING: Stable/uncomplicated  EVALUATION COMPLEXITY: Low   GOALS: Goals reviewed with patient? Yes  SHORT TERM GOALS: (target date for Short term goals are 3 weeks 03/07/2022)   1.  Patient will demonstrate independent use of home exercise program to maintain progress from in clinic treatments.  Goal status: New  LONG TERM GOALS: (target dates for all long term goals are 10 weeks  04/25/2022 )   1. Patient will demonstrate/report pain at worst less than or equal to 2/10 to facilitate minimal limitation in daily activity secondary to pain symptoms.  Goal status:  New   2. Patient will demonstrate independent use of home exercise program to facilitate ability to maintain/progress functional gains from skilled physical therapy services.  Goal status: New   3. Patient will demonstrate FOTO outcome > or = 55 % to indicate reduced disability due to condition.  Goal status: New   4.  Patient will demonstrate Rt LE MMT 5/5 throughout to faciltiate usual transfers, stairs, squatting at [facility] for daily life.   Goal status: New   5.  Patient will demonstrate ability to ascend/descend stairs c reciprocal gait pattern for navigation in community and daughters house.  Goal status: New   6.  Patient will demonstrate bilateral SLS > 10 seconds for improved stability in ambulation on even and uneven surfaces.  Goal status: New   7.  Patient will demonstrate ability to walk s deviation for normalized gait pattern.  Goal Status: New   PLAN:  PT FREQUENCY: 1-2x/week  PT DURATION: 10 weeks  PLANNED INTERVENTIONS: Therapeutic exercises, Therapeutic activity, Neuro Muscular re-education, Balance training, Gait training, Patient/Family education, Joint mobilization, Stair training, DME instructions, Dry Needling, Electrical stimulation, Traction, Cryotherapy, vasopneumatic deviceMoist heat, Taping, Ultrasound, Ionotophoresis '4mg'$ /ml Dexamethasone, and Manual therapy.  All included unless contraindicated  PLAN FOR NEXT SESSION: Review HEP knowledge/results.   Check on whether she got a cane to offload Rt knee.  Check hip strength abduction Rt and address general strength deficits.   Scot Jun, PT, DPT, OCS, ATC 02/14/22  4:29 PM    Referring diagnosis? M25.561,G89.29 (ICD-10-CM) - Chronic pain of right knee Treatment diagnosis? (if different than referring diagnosis) M25.561,G89.29 (ICD-10-CM) - Chronic pain of right knee What was this (referring dx) caused by? '[]'$  Surgery '[]'$  Fall '[x]'$  Ongoing issue '[]'$  Arthritis '[]'$  Other:  ____________  Laterality: '[x]'$  Rt '[]'$  Lt '[]'$  Both  Check all possible CPT codes:  *CHOOSE 10 OR LESS*    '[]'$  97110 (Therapeutic Exercise)  '[]'$  92507 (SLP Treatment)  '[]'$  97112 (Neuro Re-ed)   '[]'$  92526 (Swallowing Treatment)   '[]'$  97116 (Gait Training)   '[]'$  [id number] (Cognitive Training, 1st 15 minutes) '[]'$  97140 (Manual Therapy)   '[]'$  97130 (Cognitive Training, each add'l 15 minutes)  '[]'$  97164 (Re-evaluation)                              '[]'$   Other, List CPT Code ____________  '[]'$  57897 (Therapeutic Activities)     '[]'$  84784 (Self Care)   '[x]'$  All codes above (97110 - 97535)  '[]'$  97012 (Mechanical Traction)  '[x]'$  97014 (E-stim Unattended)  '[]'$  97032 (E-stim manual)  '[]'$  97033 (Ionto)  '[]'$  97035 (Ultrasound) '[x]'$  97750 (Physical Performance Training) '[]'$  [id number] (Aquatic Therapy) '[]'$  97016 (Vasopneumatic Device) '[]'$  [id number] (Paraffin) '[]'$  97034 (Contrast Bath) '[]'$  97597 (Wound Care 1st 20 sq cm) '[]'$  97598 (Wound Care each add'l 20 sq cm) '[]'$  97760 (Orthotic Fabrication, Fitting, Training Initial) '[]'$  [id number] (Prosthetic Management and Training Initial) '[]'$  [id number] (Orthotic or Prosthetic Training/ Modification Subsequent)

## 2022-02-16 NOTE — Progress Notes (Signed)
[facility] Attending ? ?Case discussed with Dr. Dean  At the time of the visit.  We reviewed the resident?s history and exam and pertinent patient test results.  I agree with the assessment, diagnosis, and plan of care documented in the resident?s note.  ?

## 2022-02-27 ENCOUNTER — Ambulatory Visit (INDEPENDENT_AMBULATORY_CARE_PROVIDER_SITE_OTHER): Payer: Medicare HMO | Admitting: Physical Therapy

## 2022-02-27 DIAGNOSIS — R262 Difficulty in walking, not elsewhere classified: Secondary | ICD-10-CM | POA: Diagnosis not present

## 2022-02-27 DIAGNOSIS — M25561 Pain in right knee: Secondary | ICD-10-CM

## 2022-02-27 DIAGNOSIS — G8929 Other chronic pain: Secondary | ICD-10-CM | POA: Diagnosis not present

## 2022-02-27 DIAGNOSIS — M6281 Muscle weakness (generalized): Secondary | ICD-10-CM | POA: Diagnosis not present

## 2022-02-27 NOTE — Therapy (Addendum)
OUTPATIENT PHYSICAL THERAPY Treatment /DISCHARGE   Patient Name: Victoria Campos MRN: [id number] DOB:October 12, 1950, 71 y.o., female Today's Date: 02/27/2022  END OF SESSION:  PT End of Session - 02/27/22 1531     Visit Number 2    Number of Visits 20    Date for PT Re-Evaluation 04/25/22    Authorization Type [employer] $10 copay    Authorization - Number of Visits 12    PT Start Time 1528   arrives late   PT Stop Time 1600    PT Time Calculation (min) 32 min    Activity Tolerance Patient tolerated treatment well;Patient limited by pain    Behavior During Therapy [facility] for tasks assessed/performed             Past Medical History:  Diagnosis Date   ANXIETY 03/10/2004   Qualifier: Diagnosis of  By: Radene Ou MD, [place]     Arthritis    Heart murmur    Hypercholesteremia    Hypertension    Insomnia    Pulmonary embolism ([place]) 09/23/2011   Acute segmental bilateral segmental PE, neg dopplers, neg coag w/u    Past Surgical History:  Procedure Laterality Date   arm surgery     torn ligamint   bunyon     removed   COLONOSCOPY  04/2011   Dr.Aggarwal-normal colon prep good   tubal ligation     Patient Active Problem List   Diagnosis Date Noted   Insomnia 04/14/2021   Chronic heart failure with preserved ejection fraction ([place]) 09/29/2018   Cerumen impaction 02/16/2015   Essential hypertension 10/15/2014   Osteoarthritis of both knees 10/15/2014   Osteoarthritis of back 01/27/2014   Myopia of left eye 03/20/2013   Sensorineural hearing loss 12/27/2009   TINNITUS 03/14/2009   Left shoulder pain 04/20/2008   BENIGN PAROXYSMAL POSITIONAL VERTIGO 10/21/2007   ALLERGIC RHINITIS, SEASONAL 08/24/2004   Hyperlipidemia 04/21/2004   DEPRESSION 03/10/2004    PCP: Farrel Gordon DO  REFERRING PROVIDER: Elba Barman, DO  REFERRING DIAG: [phone] (ICD-10-CM) - Chronic pain of right knee  THERAPY DIAG:  Chronic pain of right knee  Difficulty in walking, not elsewhere  classified  Muscle weakness (generalized)  Rationale for Evaluation and Treatment: Rehabilitation  ONSET DATE: "years" but worse in last year since 2022  SUBJECTIVE:   SUBJECTIVE STATEMENT: Pt states 9/10 pain in her Rt knee today and it is swollen.  The cold weather makes it worse. She has not yet attained a cane.   PERTINENT HISTORY: Medical hx: HTN, history of PE, hypercholestermia, OA  PAIN:  NPRS scale: at current  9/10 Pain location: Rt knee  Pain description: pressure, rubbing Aggravating factors: increased WB, kneeling, squatting, stairs Relieving factors: rest  PRECAUTIONS: None  WEIGHT BEARING RESTRICTIONS: No  FALLS:  Has patient fallen in last 6 months? No Reported sometimes having a fear of falling.   LIVING ENVIRONMENT: Lives with: grandchildren (17, 4) Lives in: House/apartment Stairs: no stairs   , daughters house does.    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  Noted tenderness in Rt lateral hip/thigh  LOWER EXTREMITY ROM:   ROM Right 02/14/2022 Left 02/14/2022  Hip flexion    Hip extension    Hip abduction    Hip adduction    Hip internal rotation    Hip external rotation    Knee flexion 101 AROM in heel slide supine c pain 116 AROM in heel slide supine c pain  Knee extension -10 AROM in seated LAQ -0 AROM in seated LAQ  Ankle dorsiflexion    Ankle plantarflexion    Ankle inversion    Ankle eversion     (Blank rows = not  tested)  LOWER EXTREMITY MMT:  MMT Right 02/14/2022 Left 02/14/2022 Right 02/27/22  Hip flexion 4/5 5/5   Hip extension     Hip abduction   4  Hip adduction     Hip internal rotation     Hip external rotation     Knee flexion 5/5 5/5   Knee extension 4/5 c pain 5/5   Ankle dorsiflexion 5/5 5/5   Ankle plantarflexion     Ankle inversion     Ankle eversion      (Blank rows = not tested)  LOWER EXTREMITY SPECIAL TESTS:  02/14/2022 No specific testing  FUNCTIONAL TESTS:  02/14/2022 18 inch chair transfer: Able to perform on 1st try without UE but harder and Rt knee pain.  Lt SLS: 4 seconds Rt SLS: < 3 seconds c pain noted in testing  GAIT: 02/14/2022 Independent ambulation c copper fit brace on Rt knee.  Noted reduced stance on Rt and decreased gait speed.  Antalgic gait.    TODAY'S TREATMENT                                                                          DATE:02/27/2022 Therex: Seated Rt knee flexion AAROM 5 sec X 10 Seated LAQ X 10 alternating bilateral Seated quad set into SLR 2X5 on Rt Supine Rt hamstring stretch with strap 10 sec X5 Supine Rt heelslides AAROM 5 sec X 10  -Vasopnuematic device X 10 min, medium compression, 34 deg to Rt knee       TODAY'S TREATMENT                                                                          DATE:02/14/2022 Therex:    HEP instruction/performance c cues for techniques, handout provided.  Trial set performed of each for comprehension and symptom assessment.  See below for exercise list  PATIENT EDUCATION:  02/14/2022 Education details: HEP, POC Person educated: Patient Education method: Explanation, Demonstration, Verbal cues, and Handouts Education comprehension: verbalized understanding, returned demonstration, and verbal cues required  HOME EXERCISE PROGRAM: Access Code: [email or web address] URL: [email or web address] Date: 02/14/2022 Prepared by: Scot Jun  Exercises - Supine Heel Slide  -  3-5 x daily - 7 x weekly - 1-2 sets - 10 reps - 2 hold -  Seated Long Arc Quad  - 3-5 x daily - 7 x weekly - 1-2 sets - 10 reps - 2 hold - Seated Quad Set  - 3-5 x daily - 7 x weekly - 1 sets - 10 reps - 5 hold - Seated Straight Leg Heel Taps  - 1-2 x daily - 7 x weekly - 1-2 sets - 5-10 reps  ASSESSMENT:  CLINICAL IMPRESSION: Shorter session due to time constraints as she arrives late. Activity tolerance limited by pain today and I did cue her to keep exercises gentle and not work through too much pain. We tried vasopnematic device at end to help control her edema and knee pain.   OBJECTIVE IMPAIRMENTS: Abnormal gait, decreased activity tolerance, decreased balance, decreased coordination, decreased endurance, decreased mobility, difficulty walking, decreased ROM, decreased strength, hypomobility, increased fascial restrictions, impaired perceived functional ability, impaired flexibility, improper body mechanics, and pain.   ACTIVITY LIMITATIONS: carrying, lifting, bending, standing, squatting, sleeping, stairs, transfers, and locomotion level  PARTICIPATION LIMITATIONS: meal prep, cleaning, laundry, interpersonal relationship, shopping, community activity, and occupation  PERSONAL FACTORS: Time since onset of injury/illness/exacerbation and Medical hx: HTN, history of PE, hypercholestermia, OA  are also affecting patient's functional outcome.   REHAB POTENTIAL: Good  CLINICAL DECISION MAKING: Stable/uncomplicated  EVALUATION COMPLEXITY: Low   GOALS: Goals reviewed with patient? Yes  SHORT TERM GOALS: (target date for Short term goals are 3 weeks 03/07/2022)   1.  Patient will demonstrate independent use of home exercise program to maintain progress from in clinic treatments.  Goal status: New  LONG TERM GOALS: (target dates for all long term goals are 10 weeks  04/25/2022 )   1. Patient will demonstrate/report pain at worst less than or equal to 2/10 to facilitate minimal  limitation in daily activity secondary to pain symptoms.  Goal status: New   2. Patient will demonstrate independent use of home exercise program to facilitate ability to maintain/progress functional gains from skilled physical therapy services.  Goal status: New   3. Patient will demonstrate FOTO outcome > or = 55 % to indicate reduced disability due to condition.  Goal status: New   4.  Patient will demonstrate Rt LE MMT 5/5 throughout to faciltiate usual transfers, stairs, squatting at [facility] for daily life.   Goal status: New   5.  Patient will demonstrate ability to ascend/descend stairs c reciprocal gait pattern for navigation in community and daughters house.  Goal status: New   6.  Patient will demonstrate bilateral SLS > 10 seconds for improved stability in ambulation on even and uneven surfaces.  Goal status: New   7.  Patient will demonstrate ability to walk s deviation for normalized gait pattern.  Goal Status: New   PLAN:  PT FREQUENCY: 1-2x/week  PT DURATION: 10 weeks  PLANNED INTERVENTIONS: Therapeutic exercises, Therapeutic activity, Neuro Muscular re-education, Balance training, Gait training, Patient/Family education, Joint mobilization, Stair training, DME instructions, Dry Needling, Electrical stimulation, Traction, Cryotherapy, vasopneumatic deviceMoist heat, Taping, Ultrasound, Ionotophoresis '4mg'$ /ml Dexamethasone, and Manual therapy.  All included unless contraindicated  PLAN FOR NEXT SESSION: Check on whether she got a cane to offload Rt knee.  address general strength deficits and work on Rt knee ROM as tolerated. Vaso if desired  Elsie Ra, PT, DPT 02/27/22 3:37 PM     PHYSICAL THERAPY DISCHARGE SUMMARY  Visits from Start of Care: 2  Current functional level related to goals / functional outcomes: See note   Remaining deficits: See note   Education /  Equipment: HEP  Patient goals were partially met. Patient is being discharged due to  not returning since the last visit.  Scot Jun, PT, DPT, OCS, ATC 04/05/22  10:15 AM         Referring diagnosis? M25.561,G89.29 (ICD-10-CM) - Chronic pain of right knee Treatment diagnosis? (if different than referring diagnosis) M25.561,G89.29 (ICD-10-CM) - Chronic pain of right knee What was this (referring dx) caused by? '[]'$  Surgery '[]'$  Fall '[x]'$  Ongoing issue '[]'$  Arthritis '[]'$  Other: ____________  Laterality: '[x]'$  Rt '[]'$  Lt '[]'$  Both  Check all possible CPT codes:  *CHOOSE 10 OR LESS*    '[]'$  97110 (Therapeutic Exercise)  '[]'$  92507 (SLP Treatment)  '[]'$  97112 (Neuro Re-ed)   '[]'$  92526 (Swallowing Treatment)   '[]'$  97116 (Gait Training)   '[]'$  [id number] (Cognitive Training, 1st 15 minutes) '[]'$  97140 (Manual Therapy)   '[]'$  97130 (Cognitive Training, each add'l 15 minutes)  '[]'$  97164 (Re-evaluation)                              '[]'$  Other, List CPT Code ____________  '[]'$  97530 (Therapeutic Activities)     '[]'$  97535 (Self Care)   '[x]'$  All codes above (97110 - 97535)  '[]'$  97012 (Mechanical Traction)  '[x]'$  97014 (E-stim Unattended)  '[]'$  97032 (E-stim manual)  '[]'$  97033 (Ionto)  '[]'$  97035 (Ultrasound) '[x]'$  97750 (Physical Performance Training) '[]'$  76160 (Aquatic Therapy) '[]'$  97016 (Vasopneumatic Device) '[]'$  [id number] (Paraffin) '[]'$  97034 (Contrast Bath) '[]'$  97597 (Wound Care 1st 20 sq cm) '[]'$  97598 (Wound Care each add'l 20 sq cm) '[]'$  97760 (Orthotic Fabrication, Fitting, Training Initial) '[]'$  [id number] (Prosthetic Management and Training Initial) '[]'$  [id number] (Orthotic or Prosthetic Training/ Modification Subsequent)

## 2022-03-08 ENCOUNTER — Encounter: Payer: Medicare HMO | Admitting: Rehabilitative and Restorative Service Providers"

## 2022-03-08 NOTE — Therapy (Incomplete)
OUTPATIENT PHYSICAL THERAPY Treatment   Patient Name: Victoria Campos MRN: [id number] DOB:1950-11-10, 71 y.o., female Today's Date: 03/08/2022  END OF SESSION:    Past Medical History:  Diagnosis Date   ANXIETY 03/10/2004   Qualifier: Diagnosis of  By: Radene Ou MD, [place]     Arthritis    Heart murmur    Hypercholesteremia    Hypertension    Insomnia    Pulmonary embolism ([place]) 09/23/2011   Acute segmental bilateral segmental PE, neg dopplers, neg coag w/u    Past Surgical History:  Procedure Laterality Date   arm surgery     torn ligamint   bunyon     removed   COLONOSCOPY  04/2011   Dr.Aggarwal-normal colon prep good   tubal ligation     Patient Active Problem List   Diagnosis Date Noted   Insomnia 04/14/2021   Chronic heart failure with preserved ejection fraction ([place]) 09/29/2018   Cerumen impaction 02/16/2015   Essential hypertension 10/15/2014   Osteoarthritis of both knees 10/15/2014   Osteoarthritis of back 01/27/2014   Myopia of left eye 03/20/2013   Sensorineural hearing loss 12/27/2009   TINNITUS 03/14/2009   Left shoulder pain 04/20/2008   BENIGN PAROXYSMAL POSITIONAL VERTIGO 10/21/2007   ALLERGIC RHINITIS, SEASONAL 08/24/2004   Hyperlipidemia 04/21/2004   DEPRESSION 03/10/2004    PCP: Farrel Gordon DO  REFERRING PROVIDER: Elba Barman, DO  REFERRING DIAG: [phone] (ICD-10-CM) - Chronic pain of right knee  THERAPY DIAG:  No diagnosis found.  Rationale for Evaluation and Treatment: Rehabilitation  ONSET DATE: "years" but worse in last year since 2022  SUBJECTIVE:   SUBJECTIVE STATEMENT: Pt states 9/10 pain in her Rt knee today and it is swollen.  The cold weather makes it worse. She has not yet attained a cane.   PERTINENT HISTORY: Medical hx: HTN, history of PE, hypercholestermia, OA  PAIN:  NPRS scale: at current  9/10 Pain location: Rt knee  Pain description: pressure, rubbing Aggravating factors: increased WB, kneeling,  squatting, stairs Relieving factors: rest  PRECAUTIONS: None  WEIGHT BEARING RESTRICTIONS: No  FALLS:  Has patient fallen in last 6 months? No Reported sometimes having a fear of falling.   LIVING ENVIRONMENT: Lives with: grandchildren (17, 4) Lives in: House/apartment Stairs: no stairs   , daughters house does.    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  Noted tenderness in Rt lateral hip/thigh  LOWER EXTREMITY ROM:   ROM Right 02/14/2022 Left 02/14/2022  Hip flexion    Hip extension    Hip abduction    Hip adduction    Hip internal rotation    Hip external rotation    Knee flexion 101 AROM in heel slide supine c pain 116 AROM in heel slide supine c pain  Knee extension -10 AROM in seated LAQ -0 AROM in seated LAQ  Ankle dorsiflexion    Ankle plantarflexion    Ankle inversion    Ankle eversion     (Blank rows = not tested)  LOWER EXTREMITY MMT:  MMT Right 02/14/2022 Left 02/14/2022 Right 02/27/22  Hip flexion 4/5 5/5   Hip extension     Hip abduction   4  Hip adduction     Hip internal rotation     Hip external rotation     Knee flexion 5/5 5/5   Knee extension 4/5 c pain 5/5   Ankle dorsiflexion 5/5 5/5   Ankle plantarflexion     Ankle inversion     Ankle eversion      (Blank rows = not tested)  LOWER EXTREMITY SPECIAL TESTS:  02/14/2022 No specific testing  FUNCTIONAL TESTS:  02/14/2022 18 inch chair transfer: Able to perform on 1st try  without UE but harder and Rt knee pain.  Lt SLS: 4 seconds Rt SLS: < 3 seconds c pain noted in testing  GAIT: 02/14/2022 Independent ambulation c copper fit brace on Rt knee.  Noted reduced stance on Rt and decreased gait speed.  Antalgic gait.    TODAY'S TREATMENT                                                                          DATE:03/08/2022 Therex: Seated Rt knee flexion AAROM 5 sec X 10 Seated LAQ X 10 alternating bilateral Seated quad set into SLR 2X5 on Rt Supine Rt hamstring stretch with strap 10 sec X5 Supine Rt heelslides AAROM 5 sec X 10  -Vasopnuematic device X 10 min, medium compression, 34 deg to Rt knee  TODAY'S TREATMENT                                                                          DATE:02/27/2022 Therex: Seated Rt knee flexion AAROM 5 sec X 10 Seated LAQ X 10 alternating bilateral Seated quad set into SLR 2X5 on Rt Supine Rt hamstring stretch with strap 10 sec X5 Supine Rt heelslides AAROM 5 sec X 10  -Vasopnuematic device X 10 min, medium compression, 34 deg to Rt knee   TODAY'S TREATMENT                                                                          DATE:02/14/2022 Therex:    HEP instruction/performance c cues for techniques, handout provided.  Trial set performed of each for comprehension and symptom assessment.  See below for exercise list  PATIENT EDUCATION:  02/14/2022 Education details: HEP, POC Person educated: Patient Education method: Explanation, Demonstration, Verbal cues, and Handouts Education comprehension: verbalized understanding, returned demonstration, and verbal cues required  HOME EXERCISE PROGRAM: Access Code: [email or web address] URL: [email or web address] Date: 02/14/2022 Prepared by: Scot Jun  Exercises - Supine Heel Slide  - 3-5 x daily - 7 x weekly -  1-2 sets - 10 reps - 2 hold - Seated Long Arc Quad  - 3-5 x daily - 7 x weekly - 1-2 sets - 10 reps - 2 hold - Seated Quad Set  - 3-5 x daily  - 7 x weekly - 1 sets - 10 reps - 5 hold - Seated Straight Leg Heel Taps  - 1-2 x daily - 7 x weekly - 1-2 sets - 5-10 reps  ASSESSMENT:  CLINICAL IMPRESSION: Shorter session due to time constraints as she arrives late. Activity tolerance limited by pain today and I did cue her to keep exercises gentle and not work through too much pain. We tried vasopnematic device at end to help control her edema and knee pain.   OBJECTIVE IMPAIRMENTS: Abnormal gait, decreased activity tolerance, decreased balance, decreased coordination, decreased endurance, decreased mobility, difficulty walking, decreased ROM, decreased strength, hypomobility, increased fascial restrictions, impaired perceived functional ability, impaired flexibility, improper body mechanics, and pain.   ACTIVITY LIMITATIONS: carrying, lifting, bending, standing, squatting, sleeping, stairs, transfers, and locomotion level  PARTICIPATION LIMITATIONS: meal prep, cleaning, laundry, interpersonal relationship, shopping, community activity, and occupation  PERSONAL FACTORS: Time since onset of injury/illness/exacerbation and Medical hx: HTN, history of PE, hypercholestermia, OA  are also affecting patient's functional outcome.   REHAB POTENTIAL: Good  CLINICAL DECISION MAKING: Stable/uncomplicated  EVALUATION COMPLEXITY: Low   GOALS: Goals reviewed with patient? Yes  SHORT TERM GOALS: (target date for Short term goals are 3 weeks 03/07/2022)   1.  Patient will demonstrate independent use of home exercise program to maintain progress from in clinic treatments.  Goal status: New  LONG TERM GOALS: (target dates for all long term goals are 10 weeks  04/25/2022 )   1. Patient will demonstrate/report pain at worst less than or equal to 2/10 to facilitate minimal limitation in daily activity secondary to pain symptoms.  Goal status: New   2. Patient will demonstrate independent use of home exercise program to facilitate ability to  maintain/progress functional gains from skilled physical therapy services.  Goal status: New   3. Patient will demonstrate FOTO outcome > or = 55 % to indicate reduced disability due to condition.  Goal status: New   4.  Patient will demonstrate Rt LE MMT 5/5 throughout to faciltiate usual transfers, stairs, squatting at [facility] for daily life.   Goal status: New   5.  Patient will demonstrate ability to ascend/descend stairs c reciprocal gait pattern for navigation in community and daughters house.  Goal status: New   6.  Patient will demonstrate bilateral SLS > 10 seconds for improved stability in ambulation on even and uneven surfaces.  Goal status: New   7.  Patient will demonstrate ability to walk s deviation for normalized gait pattern.  Goal Status: New   PLAN:  PT FREQUENCY: 1-2x/week  PT DURATION: 10 weeks  PLANNED INTERVENTIONS: Therapeutic exercises, Therapeutic activity, Neuro Muscular re-education, Balance training, Gait training, Patient/Family education, Joint mobilization, Stair training, DME instructions, Dry Needling, Electrical stimulation, Traction, Cryotherapy, vasopneumatic deviceMoist heat, Taping, Ultrasound, Ionotophoresis '4mg'$ /ml Dexamethasone, and Manual therapy.  All included unless contraindicated  PLAN FOR NEXT SESSION: Check on whether she got a cane to offload Rt knee.  address general strength deficits and work on Rt knee ROM as tolerated. Vaso if desired  Scot Jun, PT, DPT, OCS, ATC 03/08/22  8:52 AM       Referring diagnosis? M25.561,G89.29 (ICD-10-CM) - Chronic pain of right knee Treatment diagnosis? (if different than referring diagnosis) M25.561,G89.29 (  ICD-10-CM) - Chronic pain of right knee What was this (referring dx) caused by? '[]'$  Surgery '[]'$  Fall '[x]'$  Ongoing issue '[]'$  Arthritis '[]'$  Other: ____________  Laterality: '[x]'$  Rt '[]'$  Lt '[]'$  Both  Check all possible CPT codes:  *CHOOSE 10 OR LESS*    '[]'$  97110 (Therapeutic  Exercise)  '[]'$  92507 (SLP Treatment)  '[]'$  11021 (Neuro Re-ed)   '[]'$  92526 (Swallowing Treatment)   '[]'$  97116 (Gait Training)   '[]'$  [id number] (Cognitive Training, 1st 15 minutes) '[]'$  97140 (Manual Therapy)   '[]'$  97130 (Cognitive Training, each add'l 15 minutes)  '[]'$  97164 (Re-evaluation)                              '[]'$  Other, List CPT Code ____________  '[]'$  11735 (Therapeutic Activities)     '[]'$  97535 (Self Care)   '[x]'$  All codes above (97110 - 97535)  '[]'$  97012 (Mechanical Traction)  '[x]'$  97014 (E-stim Unattended)  '[]'$  97032 (E-stim manual)  '[]'$  97033 (Ionto)  '[]'$  97035 (Ultrasound) '[x]'$  97750 (Physical Performance Training) '[]'$  [id number] (Aquatic Therapy) '[]'$  97016 (Vasopneumatic Device) '[]'$  [id number] (Paraffin) '[]'$  97034 (Contrast Bath) '[]'$  97597 (Wound Care 1st 20 sq cm) '[]'$  97598 (Wound Care each add'l 20 sq cm) '[]'$  97760 (Orthotic Fabrication, Fitting, Training Initial) '[]'$  [id number] (Prosthetic Management and Training Initial) '[]'$  [id number] (Orthotic or Prosthetic Training/ Modification Subsequent)

## 2022-03-13 ENCOUNTER — Encounter: Payer: Medicare HMO | Admitting: Student

## 2022-03-14 ENCOUNTER — Encounter: Payer: Medicare HMO | Admitting: Rehabilitative and Restorative Service Providers"

## 2022-03-14 NOTE — Therapy (Incomplete)
OUTPATIENT PHYSICAL THERAPY Treatment   Patient Name: Victoria Campos MRN: [id number] DOB:May 29, 1950, 72 y.o., female Today's Date: 03/14/2022  END OF SESSION:    Past Medical History:  Diagnosis Date   ANXIETY 03/10/2004   Qualifier: Diagnosis of  By: Radene Ou MD, [place]     Arthritis    Heart murmur    Hypercholesteremia    Hypertension    Insomnia    Pulmonary embolism ([place]) 09/23/2011   Acute segmental bilateral segmental PE, neg dopplers, neg coag w/u    Past Surgical History:  Procedure Laterality Date   arm surgery     torn ligamint   bunyon     removed   COLONOSCOPY  04/2011   Dr.Aggarwal-normal colon prep good   tubal ligation     Patient Active Problem List   Diagnosis Date Noted   Insomnia 04/14/2021   Chronic heart failure with preserved ejection fraction ([place]) 09/29/2018   Cerumen impaction 02/16/2015   Essential hypertension 10/15/2014   Osteoarthritis of both knees 10/15/2014   Osteoarthritis of back 01/27/2014   Myopia of left eye 03/20/2013   Sensorineural hearing loss 12/27/2009   TINNITUS 03/14/2009   Left shoulder pain 04/20/2008   BENIGN PAROXYSMAL POSITIONAL VERTIGO 10/21/2007   ALLERGIC RHINITIS, SEASONAL 08/24/2004   Hyperlipidemia 04/21/2004   DEPRESSION 03/10/2004    PCP: Farrel Gordon DO  REFERRING PROVIDER: Elba Barman, DO  REFERRING DIAG: [phone] (ICD-10-CM) - Chronic pain of right knee  THERAPY DIAG:  No diagnosis found.  Rationale for Evaluation and Treatment: Rehabilitation  ONSET DATE: "years" but worse in last year since 2022  SUBJECTIVE:   SUBJECTIVE STATEMENT: Pt states 9/10 pain in her Rt knee today and it is swollen.  The cold weather makes it worse. She has not yet attained a cane.   PERTINENT HISTORY: Medical hx: HTN, history of PE, hypercholestermia, OA  PAIN:  NPRS scale: at current  9/10 Pain location: Rt knee  Pain description: pressure, rubbing Aggravating factors: increased WB, kneeling,  squatting, stairs Relieving factors: rest  PRECAUTIONS: None  WEIGHT BEARING RESTRICTIONS: No  FALLS:  Has patient fallen in last 6 months? No Reported sometimes having a fear of falling.   LIVING ENVIRONMENT: Lives with: grandchildren (17, 4) Lives in: House/apartment Stairs: no stairs   , daughters house does.    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  Noted tenderness in Rt lateral hip/thigh  LOWER EXTREMITY ROM:   ROM Right 02/14/2022 Left 02/14/2022 Rt 03/14/2022  Hip flexion     Hip extension     Hip abduction     Hip adduction     Hip internal rotation     Hip external rotation     Knee flexion 101 AROM in heel slide supine c pain 116 AROM in heel slide supine c pain   Knee extension -10 AROM in seated LAQ -0 AROM in seated LAQ   Ankle dorsiflexion     Ankle plantarflexion     Ankle inversion  Ankle eversion      (Blank rows = not tested)  LOWER EXTREMITY MMT:  MMT Right 02/14/2022 Left 02/14/2022 Right 02/27/22  Hip flexion 4/5 5/5   Hip extension     Hip abduction   4  Hip adduction     Hip internal rotation     Hip external rotation     Knee flexion 5/5 5/5   Knee extension 4/5 c pain 5/5   Ankle dorsiflexion 5/5 5/5   Ankle plantarflexion     Ankle inversion     Ankle eversion      (Blank rows = not tested)  LOWER EXTREMITY SPECIAL TESTS:  02/14/2022 No specific testing  FUNCTIONAL TESTS:  02/14/2022 18 inch chair transfer:  Able to perform on 1st try without UE but harder and Rt knee pain.  Lt SLS: 4 seconds Rt SLS: < 3 seconds c pain noted in testing  GAIT: 02/14/2022 Independent ambulation c copper fit brace on Rt knee.  Noted reduced stance on Rt and decreased gait speed.  Antalgic gait.    TODAY'S TREATMENT                                                                          DATE:03/14/2022 Therex: Seated Rt knee flexion AAROM 5 sec X 10 Seated LAQ X 10 alternating bilateral Seated quad set into SLR 2X5 on Rt Supine Rt hamstring stretch with strap 10 sec X5 Supine Rt heelslides AAROM 5 sec X 10  -Vasopnuematic device X 10 min, medium compression, 34 deg to Rt knee  TODAY'S TREATMENT                                                                          DATE:02/27/2022 Therex: Seated Rt knee flexion AAROM 5 sec X 10 Seated LAQ X 10 alternating bilateral Seated quad set into SLR 2X5 on Rt Supine Rt hamstring stretch with strap 10 sec X5 Supine Rt heelslides AAROM 5 sec X 10  -Vasopnuematic device X 10 min, medium compression, 34 deg to Rt knee   TODAY'S TREATMENT                                                                          DATE:02/14/2022 Therex:    HEP instruction/performance c cues for techniques, handout provided.  Trial set performed of each for comprehension and symptom assessment.  See below for exercise list  PATIENT EDUCATION:  02/14/2022 Education details: HEP, POC Person educated: Patient Education method: Explanation, Demonstration, Verbal cues, and Handouts Education comprehension: verbalized understanding, returned demonstration, and verbal cues required  HOME EXERCISE PROGRAM: Access Code: [email or web address] URL: [email or web address] Date: 02/14/2022 Prepared by: Scot Jun  Exercises -  Supine Heel Slide  - 3-5 x daily - 7 x weekly - 1-2 sets - 10 reps - 2 hold - Seated Long Arc Quad  - 3-5 x daily - 7 x weekly - 1-2 sets - 10 reps - 2 hold - Seated  Quad Set  - 3-5 x daily - 7 x weekly - 1 sets - 10 reps - 5 hold - Seated Straight Leg Heel Taps  - 1-2 x daily - 7 x weekly - 1-2 sets - 5-10 reps  ASSESSMENT:  CLINICAL IMPRESSION: Shorter session due to time constraints as she arrives late. Activity tolerance limited by pain today and I did cue her to keep exercises gentle and not work through too much pain. We tried vasopnematic device at end to help control her edema and knee pain.   OBJECTIVE IMPAIRMENTS: Abnormal gait, decreased activity tolerance, decreased balance, decreased coordination, decreased endurance, decreased mobility, difficulty walking, decreased ROM, decreased strength, hypomobility, increased fascial restrictions, impaired perceived functional ability, impaired flexibility, improper body mechanics, and pain.   ACTIVITY LIMITATIONS: carrying, lifting, bending, standing, squatting, sleeping, stairs, transfers, and locomotion level  PARTICIPATION LIMITATIONS: meal prep, cleaning, laundry, interpersonal relationship, shopping, community activity, and occupation  PERSONAL FACTORS: Time since onset of injury/illness/exacerbation and Medical hx: HTN, history of PE, hypercholestermia, OA  are also affecting patient's functional outcome.   REHAB POTENTIAL: Good  CLINICAL DECISION MAKING: Stable/uncomplicated  EVALUATION COMPLEXITY: Low   GOALS: Goals reviewed with patient? Yes  SHORT TERM GOALS: (target date for Short term goals are 3 weeks 03/07/2022)   1.  Patient will demonstrate independent use of home exercise program to maintain progress from in clinic treatments.  Goal status: New  LONG TERM GOALS: (target dates for all long term goals are 10 weeks  04/25/2022 )   1. Patient will demonstrate/report pain at worst less than or equal to 2/10 to facilitate minimal limitation in daily activity secondary to pain symptoms.  Goal status: New   2. Patient will demonstrate independent use of home exercise program to  facilitate ability to maintain/progress functional gains from skilled physical therapy services.  Goal status: New   3. Patient will demonstrate FOTO outcome > or = 55 % to indicate reduced disability due to condition.  Goal status: New   4.  Patient will demonstrate Rt LE MMT 5/5 throughout to faciltiate usual transfers, stairs, squatting at [facility] for daily life.   Goal status: New   5.  Patient will demonstrate ability to ascend/descend stairs c reciprocal gait pattern for navigation in community and daughters house.  Goal status: New   6.  Patient will demonstrate bilateral SLS > 10 seconds for improved stability in ambulation on even and uneven surfaces.  Goal status: New   7.  Patient will demonstrate ability to walk s deviation for normalized gait pattern.  Goal Status: New   PLAN:  PT FREQUENCY: 1-2x/week  PT DURATION: 10 weeks  PLANNED INTERVENTIONS: Therapeutic exercises, Therapeutic activity, Neuro Muscular re-education, Balance training, Gait training, Patient/Family education, Joint mobilization, Stair training, DME instructions, Dry Needling, Electrical stimulation, Traction, Cryotherapy, vasopneumatic deviceMoist heat, Taping, Ultrasound, Ionotophoresis '4mg'$ /ml Dexamethasone, and Manual therapy.  All included unless contraindicated  PLAN FOR NEXT SESSION: Check on whether she got a cane to offload Rt knee.  address general strength deficits and work on Rt knee ROM as tolerated. Vaso if desired  Scot Jun, PT, DPT, OCS, ATC 03/14/22  11:33 AM       Referring diagnosis? M25.561,G89.29 (ICD-10-CM) -  Chronic pain of right knee Treatment diagnosis? (if different than referring diagnosis) M25.561,G89.29 (ICD-10-CM) - Chronic pain of right knee What was this (referring dx) caused by? '[]'$  Surgery '[]'$  Fall '[x]'$  Ongoing issue '[]'$  Arthritis '[]'$  Other: ____________  Laterality: '[x]'$  Rt '[]'$  Lt '[]'$  Both  Check all possible CPT codes:  *CHOOSE 10 OR LESS*    '[]'$  97110  (Therapeutic Exercise)  '[]'$  92507 (SLP Treatment)  '[]'$  97112 (Neuro Re-ed)   '[]'$  92526 (Swallowing Treatment)   '[]'$  97116 (Gait Training)   '[]'$  [id number] (Cognitive Training, 1st 15 minutes) '[]'$  97140 (Manual Therapy)   '[]'$  97130 (Cognitive Training, each add'l 15 minutes)  '[]'$  97164 (Re-evaluation)                              '[]'$  Other, List CPT Code ____________  '[]'$  02774 (Therapeutic Activities)     '[]'$  97535 (Self Care)   '[x]'$  All codes above (97110 - 97535)  '[]'$  97012 (Mechanical Traction)  '[x]'$  97014 (E-stim Unattended)  '[]'$  97032 (E-stim manual)  '[]'$  97033 (Ionto)  '[]'$  97035 (Ultrasound) '[x]'$  97750 (Physical Performance Training) '[]'$  [id number] (Aquatic Therapy) '[]'$  97016 (Vasopneumatic Device) '[]'$  [id number] (Paraffin) '[]'$  97034 (Contrast Bath) '[]'$  97597 (Wound Care 1st 20 sq cm) '[]'$  97598 (Wound Care each add'l 20 sq cm) '[]'$  97760 (Orthotic Fabrication, Fitting, Training Initial) '[]'$  [id number] (Prosthetic Management and Training Initial) '[]'$  [id number] (Orthotic or Prosthetic Training/ Modification Subsequent)

## 2022-03-19 ENCOUNTER — Encounter: Payer: Medicare HMO | Admitting: Internal Medicine

## 2022-03-20 ENCOUNTER — Encounter: Payer: Medicare HMO | Admitting: Physical Therapy

## 2022-03-20 NOTE — Therapy (Incomplete)
OUTPATIENT PHYSICAL THERAPY Treatment   Patient Name: Victoria Campos MRN: [id number] DOB:23-Feb-1951, 72 y.o., female Today's Date: 03/20/2022  END OF SESSION:    Past Medical History:  Diagnosis Date   ANXIETY 03/10/2004   Qualifier: Diagnosis of  By: Radene Ou MD, [place]     Arthritis    Heart murmur    Hypercholesteremia    Hypertension    Insomnia    Pulmonary embolism ([place]) 09/23/2011   Acute segmental bilateral segmental PE, neg dopplers, neg coag w/u    Past Surgical History:  Procedure Laterality Date   arm surgery     torn ligamint   bunyon     removed   COLONOSCOPY  04/2011   Dr.Aggarwal-normal colon prep good   tubal ligation     Patient Active Problem List   Diagnosis Date Noted   Insomnia 04/14/2021   Chronic heart failure with preserved ejection fraction ([place]) 09/29/2018   Cerumen impaction 02/16/2015   Essential hypertension 10/15/2014   Osteoarthritis of both knees 10/15/2014   Osteoarthritis of back 01/27/2014   Myopia of left eye 03/20/2013   Sensorineural hearing loss 12/27/2009   Left shoulder pain 04/20/2008   BENIGN PAROXYSMAL POSITIONAL VERTIGO 10/21/2007   ALLERGIC RHINITIS, SEASONAL 08/24/2004   Hyperlipidemia 04/21/2004   DEPRESSION 03/10/2004    PCP: Farrel Gordon DO  REFERRING PROVIDER: Elba Barman, DO  REFERRING DIAG: [phone] (ICD-10-CM) - Chronic pain of right knee  THERAPY DIAG:  No diagnosis found.  Rationale for Evaluation and Treatment: Rehabilitation  ONSET DATE: "years" but worse in last year since 2022  SUBJECTIVE:   SUBJECTIVE STATEMENT: ***  Pt states 9/10 pain in her Rt knee today and it is swollen.  The cold weather makes it worse. She has not yet attained a cane.   PERTINENT HISTORY: Medical hx: HTN, history of PE, hypercholestermia, OA  PAIN:  NPRS scale: at current  ***  9/10 Pain location: Rt knee  Pain description: pressure, rubbing Aggravating factors: increased WB, kneeling, squatting,  stairs Relieving factors: rest  PRECAUTIONS: None  WEIGHT BEARING RESTRICTIONS: No  FALLS:  Has patient fallen in last 6 months? No Reported sometimes having a fear of falling.   LIVING ENVIRONMENT: Lives with: grandchildren (17, 4) Lives in: House/apartment Stairs: no stairs   , daughters house does.    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  Noted tenderness in Rt lateral hip/thigh  LOWER EXTREMITY ROM:   ROM Right 02/14/2022 Left 02/14/2022  Hip flexion    Hip extension    Hip abduction    Hip adduction    Hip internal rotation    Hip external rotation    Knee flexion 101 AROM in heel slide supine c pain 116 AROM in heel slide supine c pain  Knee extension -10 AROM in seated LAQ -0 AROM in seated LAQ  Ankle dorsiflexion    Ankle plantarflexion    Ankle inversion    Ankle eversion     (Blank rows = not tested)  LOWER EXTREMITY MMT:  MMT Right 02/14/2022 Left 02/14/2022 Right 02/27/22  Hip flexion 4/5 5/5   Hip extension     Hip abduction   4  Hip adduction     Hip internal rotation     Hip external rotation     Knee flexion 5/5 5/5   Knee extension 4/5 c pain 5/5   Ankle dorsiflexion 5/5 5/5   Ankle plantarflexion     Ankle inversion     Ankle eversion      (Blank rows = not tested)  LOWER EXTREMITY SPECIAL TESTS:  02/14/2022 No specific testing  FUNCTIONAL TESTS:  02/14/2022 18 inch chair transfer: Able to perform on 1st try without UE  but harder and Rt knee pain.  Lt SLS: 4 seconds Rt SLS: < 3 seconds c pain noted in testing  GAIT: 02/14/2022 Independent ambulation c copper fit brace on Rt knee.  Noted reduced stance on Rt and decreased gait speed.  Antalgic gait.    TODAY'S TREATMENT                                                                          DATE: 03/20/2022: ***  02/27/2022 Therex: Seated Rt knee flexion AAROM 5 sec X 10 Seated LAQ X 10 alternating bilateral Seated quad set into SLR 2X5 on Rt Supine Rt hamstring stretch with strap 10 sec X5 Supine Rt heelslides AAROM 5 sec X 10  -Vasopnuematic device X 10 min, medium compression, 34 deg to Rt knee     02/14/2022 Therex:    HEP instruction/performance c cues for techniques, handout provided.  Trial set performed of each for comprehension and symptom assessment.  See below for exercise list  PATIENT EDUCATION:  02/14/2022 Education details: HEP, POC Person educated: Patient Education method: Explanation, Demonstration, Verbal cues, and Handouts Education comprehension: verbalized understanding, returned demonstration, and verbal cues required  HOME EXERCISE PROGRAM: Access Code: [email or web address] URL: [email or web address] Date: 02/14/2022 Prepared by: Scot Jun  Exercises - Supine Heel Slide  - 3-5 x daily - 7 x weekly - 1-2 sets - 10 reps - 2 hold - Seated Long Arc Quad  - 3-5 x daily - 7 x weekly - 1-2 sets - 10 reps - 2 hold - Seated Quad Set  - 3-5 x daily - 7 x weekly - 1 sets - 10 reps - 5 hold - Seated Straight Leg Heel Taps  - 1-2 x daily - 7 x weekly - 1-2 sets - 5-10 reps  ASSESSMENT:  CLINICAL IMPRESSION: *** Shorter session due to time constraints as she arrives late. Activity tolerance limited by pain today and I did cue her to keep exercises gentle and not work through too much pain. We tried vasopnematic device at end to help control her edema and knee pain.   OBJECTIVE IMPAIRMENTS: Abnormal gait, decreased  activity tolerance, decreased balance, decreased coordination, decreased endurance, decreased mobility, difficulty walking, decreased ROM, decreased strength, hypomobility, increased fascial restrictions, impaired perceived functional ability, impaired flexibility, improper body mechanics, and pain.   ACTIVITY LIMITATIONS: carrying, lifting, bending, standing, squatting, sleeping, stairs, transfers, and locomotion level  PARTICIPATION LIMITATIONS: meal prep, cleaning, laundry, interpersonal relationship, shopping, community activity, and occupation  PERSONAL FACTORS: Time since onset  of injury/illness/exacerbation and Medical hx: HTN, history of PE, hypercholestermia, OA  are also affecting patient's functional outcome.   REHAB POTENTIAL: Good  CLINICAL DECISION MAKING: Stable/uncomplicated  EVALUATION COMPLEXITY: Low   GOALS: Goals reviewed with patient? Yes  SHORT TERM GOALS: (target date for Short term goals are 3 weeks 03/07/2022)   1.  Patient will demonstrate independent use of home exercise program to maintain progress from in clinic treatments.  Goal status: New  LONG TERM GOALS: (target dates for all long term goals are 10 weeks  04/25/2022 )   1. Patient will demonstrate/report pain at worst less than or equal to 2/10 to facilitate minimal limitation in daily activity secondary to pain symptoms.  Goal status: New   2. Patient will demonstrate independent use of home exercise program to facilitate ability to maintain/progress functional gains from skilled physical therapy services.  Goal status: New   3. Patient will demonstrate FOTO outcome > or = 55 % to indicate reduced disability due to condition.  Goal status: New   4.  Patient will demonstrate Rt LE MMT 5/5 throughout to faciltiate usual transfers, stairs, squatting at [facility] for daily life.   Goal status: New   5.  Patient will demonstrate ability to ascend/descend stairs c reciprocal gait pattern for navigation  in community and daughters house.  Goal status: New   6.  Patient will demonstrate bilateral SLS > 10 seconds for improved stability in ambulation on even and uneven surfaces.  Goal status: New   7.  Patient will demonstrate ability to walk s deviation for normalized gait pattern.  Goal Status: New   PLAN:  PT FREQUENCY: 1-2x/week  PT DURATION: 10 weeks  PLANNED INTERVENTIONS: Therapeutic exercises, Therapeutic activity, Neuro Muscular re-education, Balance training, Gait training, Patient/Family education, Joint mobilization, Stair training, DME instructions, Dry Needling, Electrical stimulation, Traction, Cryotherapy, vasopneumatic deviceMoist heat, Taping, Ultrasound, Ionotophoresis '4mg'$ /ml Dexamethasone, and Manual therapy.  All included unless contraindicated  PLAN FOR NEXT SESSION: ***  Check on whether she got a cane to offload Rt knee.  address general strength deficits and work on Rt knee ROM as tolerated. Vaso if desired   Jamey Reas, PT, DPT 03/20/2022, 8:15 AM    Referring diagnosis? M25.561,G89.29 (ICD-10-CM) - Chronic pain of right knee Treatment diagnosis? (if different than referring diagnosis) M25.561,G89.29 (ICD-10-CM) - Chronic pain of right knee What was this (referring dx) caused by? '[]'$  Surgery '[]'$  Fall '[x]'$  Ongoing issue '[]'$  Arthritis '[]'$  Other: ____________  Laterality: '[x]'$  Rt '[]'$  Lt '[]'$  Both  Check all possible CPT codes:  *CHOOSE 10 OR LESS*    '[]'$  97110 (Therapeutic Exercise)  '[]'$  92507 (SLP Treatment)  '[]'$  97112 (Neuro Re-ed)   '[]'$  92526 (Swallowing Treatment)   '[]'$  97116 (Gait Training)   '[]'$  [id number] (Cognitive Training, 1st 15 minutes) '[]'$  97140 (Manual Therapy)   '[]'$  97130 (Cognitive Training, each add'l 15 minutes)  '[]'$  97164 (Re-evaluation)                              '[]'$  Other, List CPT Code ____________  '[]'$  16109 (Therapeutic Activities)     '[]'$  97535 (Self Care)   '[x]'$  All codes above (97110 - 97535)  '[]'$  97012 (Mechanical Traction)  '[x]'$  97014 (E-stim  Unattended)  '[]'$  97032 (E-stim manual)  '[]'$  97033 (Ionto)  '[]'$  97035 (Ultrasound) '[x]'$  97750 (Physical Performance Training) '[]'$  [id number] (Aquatic Therapy) '[]'$  97016 (Vasopneumatic Device) '[]'$  [id number] (Paraffin) '[]'$  97034 (Contrast Bath) '[]'$   [id number] (Wound Care 1st 20 sq cm) '[]'$  97598 (Wound Care each add'l 20 sq cm) '[]'$  97760 (Orthotic Fabrication, Fitting, Training Initial) '[]'$  [id number] (Prosthetic Management and Training Initial) '[]'$  [phone] (Orthotic or Prosthetic Training/ Modification Subsequent)

## 2022-03-21 ENCOUNTER — Encounter: Payer: Medicare HMO | Admitting: Internal Medicine

## 2022-03-21 NOTE — Progress Notes (Deleted)
OA: Patient follows with orhopedic surgery who, at her last OV in 12/2021, opted to refer her to physical therapy for 4-6 weeks before re-evaluating her symptoms and discussing further surgical intervention versus intraarticular injections which she has declined to this point. She has* started working with physical therapy and *. Current medical interventions include ibuprofen as needed, topical voltaren gel, heat/ice.*** Plan:  HLD: Current regimen is atorvastatin 80 mg daily, zetia 10 mg daily. Lipid profile last checked 10/2021 with LDL above goal, 118. Plan:Lipid profile today. Continue current regimen.  HTN: BP at goal today, *. Current regimen is HCTZ 25 mg daily, losartan 100 mg daily, spironolactone 50 mg daily. Last metabolic panel collected 96/4383 WNL. Plan:BMP today.  HFpEF: P

## 2022-04-05 ENCOUNTER — Encounter: Payer: Medicare HMO | Admitting: Student

## 2022-04-10 ENCOUNTER — Other Ambulatory Visit: Payer: Self-pay | Admitting: Internal Medicine

## 2022-04-13 ENCOUNTER — Other Ambulatory Visit: Payer: Self-pay | Admitting: Internal Medicine

## 2022-04-13 DIAGNOSIS — M170 Bilateral primary osteoarthritis of knee: Secondary | ICD-10-CM

## 2022-04-13 DIAGNOSIS — M47895 Other spondylosis, thoracolumbar region: Secondary | ICD-10-CM

## 2022-05-01 DIAGNOSIS — H52223 Regular astigmatism, bilateral: Secondary | ICD-10-CM | POA: Diagnosis not present

## 2022-05-07 ENCOUNTER — Ambulatory Visit: Payer: Medicare HMO | Admitting: *Deleted

## 2022-05-07 DIAGNOSIS — Z111 Encounter for screening for respiratory tuberculosis: Secondary | ICD-10-CM

## 2022-05-09 ENCOUNTER — Ambulatory Visit: Payer: Medicare HMO

## 2022-05-09 LAB — TB SKIN TEST
Induration: 0 mm
TB Skin Test: NEGATIVE

## 2022-05-15 ENCOUNTER — Other Ambulatory Visit: Payer: Self-pay | Admitting: Internal Medicine

## 2022-05-15 DIAGNOSIS — I10 Essential (primary) hypertension: Secondary | ICD-10-CM

## 2022-05-16 ENCOUNTER — Other Ambulatory Visit: Payer: Self-pay | Admitting: Internal Medicine

## 2022-05-16 DIAGNOSIS — I5032 Chronic diastolic (congestive) heart failure: Secondary | ICD-10-CM

## 2022-05-22 ENCOUNTER — Other Ambulatory Visit: Payer: Self-pay

## 2022-05-22 ENCOUNTER — Ambulatory Visit (INDEPENDENT_AMBULATORY_CARE_PROVIDER_SITE_OTHER): Payer: Medicare HMO | Admitting: Student

## 2022-05-22 ENCOUNTER — Encounter: Payer: Self-pay | Admitting: Student

## 2022-05-22 VITALS — BP 139/69 | HR 74 | Temp 98.3°F | Ht 64.5 in | Wt 182.8 lb

## 2022-05-22 DIAGNOSIS — Z1159 Encounter for screening for other viral diseases: Secondary | ICD-10-CM | POA: Diagnosis not present

## 2022-05-22 DIAGNOSIS — Z008 Encounter for other general examination: Secondary | ICD-10-CM | POA: Diagnosis not present

## 2022-05-22 DIAGNOSIS — I5032 Chronic diastolic (congestive) heart failure: Secondary | ICD-10-CM | POA: Diagnosis not present

## 2022-05-22 NOTE — Assessment & Plan Note (Signed)
Assessment: Victoria Campos presents today for a health examination certificate to begin working in the [employer]. 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   She has already had a PPD performed at [facility] that was negative 05/07/22 and 05/09/22. We discussed her vision, she follows with at [facility] and wears reading glasses. She has an upcoming appointment to have her cataracts removed. No other visual concerns. She denies any hearing impairment. She has a history of HFpEF and endorses NYHA II/III symptoms depending on the day and level of exertion. She becomes short of breath when walking up steps and long distance flat surfaces. Denies history of COPD/asthma. She denies any limitations with carrying or lifting heavy objects.   In regards to her vaccines, she is due to Td and is unsure if she has been vaccinated against hepatitis B. She notes history of MMR vaccination.   She will need to determine the amount of walking/exertion she can do without becoming short of breath. Because she has worked within the [employer] within the last 30 years, believe she would have been vaccinated for hepatitis B. Will check hepatitis b total core, hepatitis surface antigen and antibody.   Plan: - hepatitis B serologies pending - discussed limitations with dyspnea - will complete paperwork once Td vaccine updated and hepatitis B testing returns  Addendum:  Hep B labs consistent with her being susceptible to infection. Will need hep b vaccinations. Form filled out and left with front desk. Called and left voicemail asking patient to call back, voicemail did not identify party.

## 2022-05-22 NOTE — Patient Instructions (Addendum)
Thank you, [name] for allowing [place] to provide your care today. Today we discussed .  [employer] Form We will fill out your form and have it ready for you when your hepatitis B testing results. We will call you when this is ready.   Please go to your local pharmacy for your tetanus shot.     I have ordered the following labs for you:   Lab Orders         Hepatitis B Surface Antibody         Hepatitis B surface antigen         Hepatitis B core antibody, total        Referrals ordered today:   Referral Orders  No referral(s) requested today     I have ordered the following medication/changed the following medications:   Stop the following medications: There are no discontinued medications.   Start the following medications: No orders of the defined types were placed in this encounter.    Follow up: As needed or if abnormal hepatits B testing   Should you have any questions or concerns please call the internal medicine clinic at [phone].    Victoria Campos, D.O. [place]

## 2022-05-22 NOTE — Progress Notes (Signed)
CC: Health Screening  HPI:  Victoria Campos is a 72 y.o. female living with a history stated below and presents today for health screening for her new job in the [employer] system. Please see problem based assessment and plan for additional details.  Past Medical History:  Diagnosis Date   ANXIETY 03/10/2004   Qualifier: Diagnosis of  By: Radene Ou MD, [place]     Arthritis    Heart murmur    Hypercholesteremia    Hypertension    Insomnia    Pulmonary embolism ([place]) 09/23/2011   Acute segmental bilateral segmental PE, neg dopplers, neg coag w/u     Current Outpatient Medications on File Prior to Visit  Medication Sig Dispense Refill   atorvastatin (LIPITOR) 80 MG tablet TAKE 1 TABLET EVERY DAY 90 tablet 3   diclofenac Sodium (VOLTAREN) 1 % GEL Apply 4 g topically 4 (four) times daily. 150 g 1   ezetimibe (ZETIA) 10 MG tablet TAKE 1 TABLET BY MOUTH EVERY DAY 30 tablet 11   fluticasone (FLONASE) 50 MCG/ACT nasal spray USE 2 SPRAYS IN EACH NOSTRIL EVERY DAY 48 g 3   furosemide (LASIX) 20 MG tablet TAKE 1 TABLET EVERY DAY 90 tablet 3   hydrochlorothiazide (HYDRODIURIL) 25 MG tablet TAKE 1 TABLET EVERY DAY 90 tablet 2   HYDROcodone-acetaminophen (NORCO/VICODIN) 5-325 MG tablet Take 0.5-1 tablets by mouth 2 (two) times daily as needed. 4 tablet 0   IBU 600 MG tablet TAKE 1 TABLET EVERY 6 HOURS AS NEEDED 90 tablet 3   losartan (COZAAR) 100 MG tablet TAKE 1 TABLET EVERY DAY 90 tablet 3   spironolactone (ALDACTONE) 50 MG tablet Take 1 tablet (50 mg total) by mouth daily. 30 tablet 11   No current facility-administered medications on file prior to visit.    Family History  Problem Relation Age of Onset   Heart attack Father        died at age 31   Heart disease Father    Colon cancer Sister    Cancer Sister        colon   Cancer Sister        breast   Cancer Sister    Colon cancer Brother    Cancer Brother        colon   Esophageal cancer Neg Hx     Stomach cancer Neg Hx     Social History   Socioeconomic History   Marital status: Single    Spouse name: Not on file   Number of children: Not on file   Years of education: Not on file   Highest education level: Not on file  Occupational History   Not on file  Tobacco Use   Smoking status: Former    Types: Cigarettes    Quit date: 03/12/1968    Years since quitting: 54.2   Smokeless tobacco: Never  Vaping Use   Vaping Use: Never used  Substance and Sexual Activity   Alcohol use: Yes    Alcohol/week: 1.0 standard drink of alcohol    Types: 1 Glasses of wine per week    Comment: occasional   Drug use: No   Sexual activity: Not Currently  Other Topics Concern   Not on file  Social History Narrative   Current Social History 09/07/2020        Patient lives with family in a home which is 1 story. There is one step up to the entrance the patient uses.  Patient's method of transportation is personal car and her daughter drives her to appointments as well.      The highest level of education was college diploma.      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      Identified important Relationships are God and her children       Pets : None       Interests / Fun: Traveling with her family; cookouts       Current Stressors: None           Social Determinants of Health   Financial Resource Strain: Low Risk  (10/10/2021)   Overall Financial Resource Strain (CARDIA)    Difficulty of Paying Living Expenses: Not hard at all  Food Insecurity: No Food Insecurity (02/09/2022)   Hunger Vital Sign    Worried About Running Out of Food in the Last Year: Never true    Ran Out of Food in the Last Year: Never true  Transportation Needs: No Transportation Needs (02/09/2022)   PRAPARE - Hydrologist (Medical): No    Lack of Transportation (Non-Medical): No  Physical Activity: Insufficiently Active (10/10/2021)    Exercise Vital Sign    Days of Exercise per Week: 7 days    Minutes of Exercise per Session: 20 min  Stress: No Stress Concern Present (10/10/2021)   [place]    Feeling of Stress : Not at all  Social Connections: Moderately Integrated (02/09/2022)   Social Connection and Isolation Panel [NHANES]    Frequency of Communication with Friends and Family: More than three times a week    Frequency of Social Gatherings with Friends and Family: More than three times a week    Attends Religious Services: More than 4 times per year    Active Member of [employer] or Organizations: Yes    Attends Archivist Meetings: 1 to 4 times per year    Marital Status: Divorced  Human resources officer Violence: Not At Risk (02/09/2022)   Humiliation, Afraid, Rape, and Kick questionnaire    Fear of Current or Ex-Partner: No    Emotionally Abused: No    Physically Abused: No    Sexually Abused: No    Review of Systems: ROS negative except for what is noted on the assessment and plan.  Vitals:   05/22/22 0920  BP: 139/69  Pulse: 74  Temp: 98.3 F (36.8 C)  TempSrc: Oral  SpO2: 97%  Weight: 182 lb 12.8 oz (82.9 kg)  Height: 5' 4.5" (1.638 m)    Physical Exam: Constitutional: well-appearing, in no acute distress HENT: normocephalic atraumatic Eyes: conjunctiva non-erythematous Neck: supple Cardiovascular: regular rate and rhythm, 2/6 systolic murmur best heart RUSB. No lower extremity edema Pulmonary/Chest: normal work of breathing on room air, lungs clear to auscultation bilaterally MSK: normal bulk and tone Neurological: alert & oriented x 3, Skin: warm and dry Psych: normal mood  Assessment & Plan:   Encounter for work capability assessment Assessment: Victoria Campos presents today for a health examination certificate to begin working in the [employer]. 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   She has already had a PPD performed at [facility] that was negative 05/07/22 and 05/09/22. We discussed her vision, she follows with at [facility] and wears reading glasses. She has an upcoming  appointment to have her cataracts removed. No other visual concerns. She denies any hearing impairment. She has a history of HFpEF and endorses NYHA II/III symptoms depending on the day and level of exertion. She becomes short of breath when walking up steps and long distance flat surfaces. Denies history of COPD/asthma. She denies any limitations with carrying or lifting heavy objects.   In regards to her vaccines, she is due to Td and is unsure if she has been vaccinated against hepatitis B. She notes history of MMR vaccination.   She will need to determine the amount of walking/exertion she can do without becoming short of breath. Because she has worked within the [employer] within the last 30 years, believe she would have been vaccinated for hepatitis B. Will check hepatitis b total core, hepatitis surface antigen and antibody.   Plan: - hepatitis B serologies pending - discussed limitations with dyspnea - will complete paperwork once Td vaccine updated and hepatitis B testing returns  Chronic heart failure with preserved ejection fraction (HCC) Assessment: In our conversation today she notes taking lasix every few weeks for episodes of lower extremity swelling. She has dyspnea with walking up steps and occasionally with flat surfaces around 20-30 yards. Last echocardiogram displayed diastolic dysfunction.   We discussed SGLT-2i therapies however she would like to defer treatment at this time.   Plan: - continue to monitor heart failure symptoms - discuss SGLT-2i at follow up visits  Patient discussed with Dr. Laurena Slimmer, D.O. [facility], PGY-3 Phone: [phone] Date 05/22/2022 Time 2:10 PM

## 2022-05-22 NOTE — Assessment & Plan Note (Signed)
Assessment: In our conversation today she notes taking lasix every few weeks for episodes of lower extremity swelling. She has dyspnea with walking up steps and occasionally with flat surfaces around 20-30 yards. Last echocardiogram displayed diastolic dysfunction.   We discussed SGLT-2i therapies however she would like to defer treatment at this time.   Plan: - continue to monitor heart failure symptoms - discuss SGLT-2i at follow up visits

## 2022-05-24 LAB — HEPATITIS B SURFACE ANTIGEN: Hepatitis B Surface Ag: NEGATIVE

## 2022-05-24 LAB — HEPATITIS B SURFACE ANTIBODY,QUALITATIVE: Hep B Surface Ab, Qual: NONREACTIVE

## 2022-05-24 LAB — HEPATITIS B CORE ANTIBODY, TOTAL: Hep B Core Total Ab: NEGATIVE

## 2022-05-24 NOTE — Progress Notes (Signed)
Internal Medicine Clinic Attending  Case discussed with Dr. Katsadouros  At the time of the visit.  We reviewed the resident's history and exam and pertinent patient test results.  I agree with the assessment, diagnosis, and plan of care documented in the resident's note.  

## 2022-06-04 DIAGNOSIS — H2512 Age-related nuclear cataract, left eye: Secondary | ICD-10-CM | POA: Diagnosis not present

## 2022-06-04 DIAGNOSIS — H2511 Age-related nuclear cataract, right eye: Secondary | ICD-10-CM | POA: Diagnosis not present

## 2022-07-11 DIAGNOSIS — H2512 Age-related nuclear cataract, left eye: Secondary | ICD-10-CM | POA: Diagnosis not present

## 2022-07-11 DIAGNOSIS — H269 Unspecified cataract: Secondary | ICD-10-CM | POA: Diagnosis not present

## 2022-08-29 DIAGNOSIS — H2511 Age-related nuclear cataract, right eye: Secondary | ICD-10-CM | POA: Diagnosis not present

## 2022-09-05 ENCOUNTER — Other Ambulatory Visit: Payer: Self-pay | Admitting: Internal Medicine

## 2022-09-05 DIAGNOSIS — I10 Essential (primary) hypertension: Secondary | ICD-10-CM

## 2022-11-16 ENCOUNTER — Other Ambulatory Visit: Payer: Self-pay | Admitting: Internal Medicine

## 2022-11-16 DIAGNOSIS — I10 Essential (primary) hypertension: Secondary | ICD-10-CM

## 2022-11-19 ENCOUNTER — Encounter: Payer: Medicare HMO | Admitting: Student

## 2022-11-20 ENCOUNTER — Ambulatory Visit (INDEPENDENT_AMBULATORY_CARE_PROVIDER_SITE_OTHER): Payer: Medicare HMO | Admitting: Student

## 2022-11-20 ENCOUNTER — Other Ambulatory Visit: Payer: Self-pay

## 2022-11-20 ENCOUNTER — Encounter: Payer: Self-pay | Admitting: Student

## 2022-11-20 VITALS — BP 161/79 | HR 91 | Temp 98.2°F | Ht 64.0 in | Wt 182.0 lb

## 2022-11-20 DIAGNOSIS — M170 Bilateral primary osteoarthritis of knee: Secondary | ICD-10-CM | POA: Diagnosis not present

## 2022-11-20 DIAGNOSIS — H8110 Benign paroxysmal vertigo, unspecified ear: Secondary | ICD-10-CM | POA: Diagnosis not present

## 2022-11-20 DIAGNOSIS — I10 Essential (primary) hypertension: Secondary | ICD-10-CM

## 2022-11-20 MED ORDER — MECLIZINE HCL 12.5 MG PO TABS: 12.5000 mg | ORAL_TABLET | Freq: Three times a day (TID) | ORAL | 0 refills | Status: DC | PRN

## 2022-11-20 MED ORDER — NAPROXEN 500 MG PO TABS: 500.0000 mg | ORAL_TABLET | Freq: Two times a day (BID) | ORAL | 0 refills | Status: AC

## 2022-11-20 NOTE — Patient Instructions (Signed)
Thank you so much for coming to the clinic today!   For your vertigo, I am sending in a short course of a drug called meclizine and also placing referral to the vestibular physical therapy.  Please also bring this up with your ear doctor.  For your knee, I have sent in a medication called naproxen, if this works for you please let us know.  Also would recommend making appoint an appointment with your orthopedic doctor.  If you have any questions please feel free to the call the clinic at anytime at [phone]. It was a pleasure seeing you!  Best, Dr. Thomasene Ripple

## 2022-11-21 NOTE — Progress Notes (Signed)
CC: Vertigo  HPI:  Victoria Campos is a 72 y.o. female living with a history stated below and presents today for vertigo. Please see problem based assessment and plan for additional details.  Past Medical History:  Diagnosis Date   ANXIETY 03/10/2004   Qualifier: Diagnosis of  By: Barbaraann Barthel MD, [place]     Arthritis    Heart murmur    Hypercholesteremia    Hypertension    Insomnia    Pulmonary embolism (HCC) 09/23/2011   Acute segmental bilateral segmental PE, neg dopplers, neg coag w/u     Current Outpatient Medications on File Prior to Visit  Medication Sig Dispense Refill   atorvastatin (LIPITOR) 80 MG tablet TAKE 1 TABLET EVERY DAY 90 tablet 3   diclofenac Sodium (VOLTAREN) 1 % GEL Apply 4 g topically 4 (four) times daily. 150 g 1   ezetimibe (ZETIA) 10 MG tablet TAKE 1 TABLET BY MOUTH EVERY DAY 30 tablet 11   fluticasone (FLONASE) 50 MCG/ACT nasal spray USE 2 SPRAYS IN EACH NOSTRIL EVERY DAY 48 g 3   furosemide (LASIX) 20 MG tablet TAKE 1 TABLET EVERY DAY 90 tablet 3   hydrochlorothiazide (HYDRODIURIL) 25 MG tablet TAKE 1 TABLET EVERY DAY 90 tablet 2   HYDROcodone-acetaminophen (NORCO/VICODIN) 5-325 MG tablet Take 0.5-1 tablets by mouth 2 (two) times daily as needed. 4 tablet 0   losartan (COZAAR) 100 MG tablet TAKE 1 TABLET EVERY DAY 90 tablet 3   spironolactone (ALDACTONE) 50 MG tablet Take 1 tablet (50 mg total) by mouth daily. 30 tablet 11   No current facility-administered medications on file prior to visit.    Family History  Problem Relation Age of Onset   Heart attack Father        died at age 38   Heart disease Father    Colon cancer Sister    Cancer Sister        colon   Cancer Sister        breast   Cancer Sister    Colon cancer Brother    Cancer Brother        colon   Esophageal cancer Neg Hx    Stomach cancer Neg Hx     Social History   Socioeconomic History   Marital status: Single    Spouse name: Not on file   Number of children: Not on  file   Years of education: Not on file   Highest education level: Not on file  Occupational History   Not on file  Tobacco Use   Smoking status: Former    Current packs/day: 0.00    Types: Cigarettes    Quit date: 03/12/1968    Years since quitting: 54.7   Smokeless tobacco: Never  Vaping Use   Vaping status: Never Used  Substance and Sexual Activity   Alcohol use: Yes    Alcohol/week: 1.0 standard drink of alcohol    Types: 1 Glasses of wine per week    Comment: occasional   Drug use: No   Sexual activity: Not Currently  Other Topics Concern   Not on file  Social History Narrative   Current Social History 09/07/2020        Patient lives with family in a home which is 1 story. There is one step up to the entrance the patient uses.       Patient's method of transportation is personal car and her daughter drives her to appointments as well.  The highest level of education was college diploma.      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      Identified important Relationships are God and her children       Pets : None       Interests / Fun: Traveling with her family; cookouts       Current Stressors: None           Social Determinants of Health   Financial Resource Strain: Low Risk  (10/10/2021)   Overall Financial Resource Strain (CARDIA)    Difficulty of Paying Living Expenses: Not hard at all  Food Insecurity: No Food Insecurity (02/09/2022)   Hunger Vital Sign    Worried About Running Out of Food in the Last Year: Never true    Ran Out of Food in the Last Year: Never true  Transportation Needs: No Transportation Needs (02/09/2022)   PRAPARE - Administrator, Civil Service (Medical): No    Lack of Transportation (Non-Medical): No  Physical Activity: Insufficiently Active (10/10/2021)   Exercise Vital Sign    Days of Exercise per Week: 7 days    Minutes of Exercise per Session: 20 min  Stress: No Stress  Concern Present (10/10/2021)   [employer] of Occupational Health - Occupational Stress Questionnaire    Feeling of Stress : Not at all  Social Connections: Moderately Integrated (02/09/2022)   Social Connection and Isolation Panel [NHANES]    Frequency of Communication with Friends and Family: More than three times a week    Frequency of Social Gatherings with Friends and Family: More than three times a week    Attends Religious Services: More than 4 times per year    Active Member of [employer] or Organizations: Yes    Attends Banker Meetings: 1 to 4 times per year    Marital Status: Divorced  Catering manager Violence: Not At Risk (02/09/2022)   Humiliation, Afraid, Rape, and Kick questionnaire    Fear of Current or Ex-Partner: No    Emotionally Abused: No    Physically Abused: No    Sexually Abused: No    Review of Systems: ROS negative except for what is noted on the assessment and plan.  Vitals:   11/20/22 1455 11/20/22 1459  BP: (!) 149/76 (!) 161/79  Pulse: 95 91  Temp: 98.2 F (36.8 C)   TempSrc: Oral   SpO2: 100%   Weight: 182 lb (82.6 kg)   Height: 5\' 4"  (1.626 m)     Physical Exam: Constitutional: well-appearing female in no acute distress HENT: normocephalic atraumatic, mucous membranes moist Eyes: conjunctiva non-erythematous, negative head tilt test Cardiovascular: regular rate and rhythm, no m/r/g Pulmonary/Chest: normal work of breathing on room air, lungs clear to auscultation bilaterally MSK: normal bulk and tone, tenderness to palpation over right patella, patient able to ambulate without assistance, no effusions palpated   Assessment & Plan:   Benign paroxysmal positional vertigo Patient presents with complaints of the room spinning around at night.  She states this has been a chronic issue and has been going on intermittently for the last few years.  Recently it started about a month ago.  She feels as if the room is spinning around her  only at night, and when she is going from sitting down in her bed to laying down.  She does not know how long these usually last as she falls asleep.  When she stands up in the morning she does not have any symptoms, or throughout the day.  Her blood pressure is elevated today, so low concern for hypotension causing this.  However, she is on Lasix and hydrochlorothiazide which could be leading to excess diuresis.  Head tilt test within normal limits with no nystagmus seen.  Overall, symptoms can be seen with benign positional paroxysmal vertigo, however hypotension at nighttime cannot be ruled out at this time.  Encourage patient to check blood pressure before she goes to bed.  Plan: - Will place referral for vestibular rehab - Will do short course of meclizine to see if this helps - Encourage patient to check blood pressure before she goes to bed  Osteoarthritis of both knees Patient complaining of pain in her right knee, she has been dealing with this issue for quite some time.  She was previously evaluated by Ortho and even though she would be a good candidate for surgery she does not want any.  She has tried conservative management with meloxicam, ibuprofen which does help.  Will give a short course of naproxen for her, and encouraged her to make an appointment with orthopedic surgery for further medications or pain management routines.  Plan: - Naproxen 500 mg twice a day for 2 weeks   Essential hypertension Patient with blood pressure very elevated at 160 systolic.  Her current regimen is spironolactone 50 mg, hydrochlorothiazide 25 mg, Lasix 20 mg, and losartan 100 mg.  She is compliant with this regimen and takes everything in the morning.  She states that her blood pressure when she is home is around 120 systolic.  Will not make any changes at this time, but encourage patient to write down blood pressures at home.   Patient discussed with Dr. Rockey Situ, M.D. [facility]  Health Internal Medicine, PGY-2 Pager: [phone] Date 11/21/2022 Time 8:39 AM

## 2022-11-21 NOTE — Assessment & Plan Note (Signed)
Patient with blood pressure very elevated at 160 systolic.  Her current regimen is spironolactone 50 mg, hydrochlorothiazide 25 mg, Lasix 20 mg, and losartan 100 mg.  She is compliant with this regimen and takes everything in the morning.  She states that her blood pressure when she is home is around 120 systolic.  Will not make any changes at this time, but encourage patient to write down blood pressures at home.

## 2022-11-21 NOTE — Assessment & Plan Note (Signed)
Patient presents with complaints of the room spinning around at night.  She states this has been a chronic issue and has been going on intermittently for the last few years.  Recently it started about a month ago.  She feels as if the room is spinning around her only at night, and when she is going from sitting down in her bed to laying down.  She does not know how long these usually last as she falls asleep.  When she stands up in the morning she does not have any symptoms, or throughout the day.  Her blood pressure is elevated today, so low concern for hypotension causing this.  However, she is on Lasix and hydrochlorothiazide which could be leading to excess diuresis.  Head tilt test within normal limits with no nystagmus seen.  Overall, symptoms can be seen with benign positional paroxysmal vertigo, however hypotension at nighttime cannot be ruled out at this time.  Encourage patient to check blood pressure before she goes to bed.  Plan: - Will place referral for vestibular rehab - Will do short course of meclizine to see if this helps - Encourage patient to check blood pressure before she goes to bed

## 2022-11-21 NOTE — Assessment & Plan Note (Signed)
Patient complaining of pain in her right knee, she has been dealing with this issue for quite some time.  She was previously evaluated by Ortho and even though she would be a good candidate for surgery she does not want any.  She has tried conservative management with meloxicam, ibuprofen which does help.  Will give a short course of naproxen for her, and encouraged her to make an appointment with orthopedic surgery for further medications or pain management routines.  Plan: - Naproxen 500 mg twice a day for 2 weeks

## 2022-11-23 NOTE — Progress Notes (Signed)
Internal Medicine Clinic Attending  Case discussed with the resident at the time of the visit.  We reviewed the resident's history and exam and pertinent patient test results.  I agree with the assessment, diagnosis, and plan of care documented in the resident's note.

## 2022-11-25 ENCOUNTER — Other Ambulatory Visit: Payer: Self-pay | Admitting: Internal Medicine

## 2022-11-25 DIAGNOSIS — I10 Essential (primary) hypertension: Secondary | ICD-10-CM

## 2022-11-27 NOTE — Telephone Encounter (Signed)
No longer on this dose

## 2022-11-28 NOTE — Progress Notes (Signed)
This encounter was created in error - please disregard.  I called patient and got her voicemail, twice.  I left a detailed message for her to call the office to reschedule her Medicare AWV.

## 2022-11-29 ENCOUNTER — Telehealth: Payer: Self-pay

## 2022-11-29 NOTE — Telephone Encounter (Signed)
Unsuccessful attempt to reach patient on preferred number listed in notes for scheduled AWV. Left message on voicemail okay to reschedule. 

## 2023-02-20 ENCOUNTER — Other Ambulatory Visit: Payer: Self-pay | Admitting: Internal Medicine

## 2023-02-21 ENCOUNTER — Other Ambulatory Visit: Payer: Self-pay

## 2023-02-23 ENCOUNTER — Other Ambulatory Visit: Payer: Self-pay | Admitting: Student

## 2023-02-25 ENCOUNTER — Encounter: Payer: Medicare HMO | Admitting: Internal Medicine

## 2023-02-25 MED ORDER — SPIRONOLACTONE 50 MG PO TABS: 50.0000 mg | ORAL_TABLET | Freq: Every day | ORAL | 11 refills | Status: DC

## 2023-02-25 NOTE — Progress Notes (Unsigned)
HFpEF No signs of volume overload on exam day. OP regimen includes lasix 20 mg daily, losartan 100 mg daily.  Plan:  HTN BP ***, on recheck ***. OP regimen includes hydrochlorothiazide 25 mg daily, losartan 100 mg daily, spironolactone 50 mg daily. BMP last checked ***. Plan:  BPPV OA OF BACK, BL KNEES MDD  HLD OP regimen is atorvastatin 80 mg daily, zetia 10 mg daily. Lipid panel last checked ***. Plan:Continue current regimen.  INSOMNIA  HCM: MAMMO FLU TETANUS  Voltaren gel Norco 5-325 Meclizine 12.5

## 2023-03-19 ENCOUNTER — Encounter: Payer: Medicare HMO | Admitting: Internal Medicine

## 2023-03-19 NOTE — Progress Notes (Deleted)
 HTN BP ***. OP regimen is spironolactone  50 mg daily, losartan  100 mg daily, hydrochlorothiazide  25 mg daily, furosemide  20 mg daily which she is*** compliant with. BMP last checked 11/2021 with normal electrolytes and renal function. Plan:BMP today. Continue current regimen.  OA OP regimen is voltaren  gel. *** Plan:  HLD OP regimen is atorvastatin  80 mg daily, zetia  10 mg daily which she is*** compliant with. Lipid panel last checked 10/2021 with LDL above goal, 118.  Plan:Continue current regimen. Lipid panel today.  HCM Mammo Flu tetanus

## 2023-04-04 ENCOUNTER — Ambulatory Visit: Payer: Medicare HMO | Admitting: Student

## 2023-04-04 VITALS — BP 126/65 | HR 76 | Temp 98.7°F | Ht 64.0 in | Wt 180.7 lb

## 2023-04-04 DIAGNOSIS — H6121 Impacted cerumen, right ear: Secondary | ICD-10-CM | POA: Diagnosis not present

## 2023-04-04 DIAGNOSIS — R011 Cardiac murmur, unspecified: Secondary | ICD-10-CM | POA: Diagnosis not present

## 2023-04-04 DIAGNOSIS — M170 Bilateral primary osteoarthritis of knee: Secondary | ICD-10-CM | POA: Diagnosis not present

## 2023-04-04 DIAGNOSIS — I10 Essential (primary) hypertension: Secondary | ICD-10-CM | POA: Diagnosis not present

## 2023-04-04 DIAGNOSIS — Z1231 Encounter for screening mammogram for malignant neoplasm of breast: Secondary | ICD-10-CM

## 2023-04-04 NOTE — Assessment & Plan Note (Signed)
Patient has a history of osteoarthritis of the bilateral knees.  Patient reports right-sided pain greater than left-sided knee pain.  She presented to the clinic today seeking nonsurgical options.  Patient has tried physical therapy in the past that did not help.  She has tried numerous steroid injections in the past as well which did help; however, patient would like a longer-lasting alternative.  She stated that she will try ibuprofen and Voltaren gel which do help her. Plan -Patient was instructed to call her orthopedic surgeon and inquire about gel injections into her knees (viscosupplementation).

## 2023-04-04 NOTE — Assessment & Plan Note (Signed)
Patient reports right greater than left ear "fullness".  She stated that she needs routine ear flushing roughly every 6 months and that she has been dealing with impacted cerumen for years now.  She tried eardrops over-the-counter but does not believe that she has tried Debrox before.  She denies using Q-tips. Initial evaluation of exam does repair right sided impacted cerumen, I was unable to visualize an tympanic membrane.  After flushing the right ear canal, I was able to visualize an intact right tympanic membrane.  She does not have impacted left-sided cerumen, left tympanic membrane was normal in appearance. Plan: -Patient was instructed to purchase Debrox drops and to continue avoiding Q-tips

## 2023-04-04 NOTE — Progress Notes (Signed)
Established Patient Office Visit  Subjective   Patient ID: Victoria Campos, female    DOB: 20-Aug-1950  Age: 73 y.o. MRN: [id number]  Chief Complaint  Patient presents with   Cerumen Impaction    "Can I get my ears flushed"    Knee Pain    Discuss non-surgical options     Victoria Campos is a 73 y.o. who presents to the clinic for concerns of knee osteoarthritis and impacted cerumen. Please see problem based assessment and plan for additional details.  Patient Active Problem List   Diagnosis Date Noted   Heart murmur, systolic 04/04/2023   Encounter for work capability assessment 05/22/2022   Insomnia 04/14/2021   Chronic heart failure with preserved ejection fraction (HCC) 09/29/2018   Cerumen impaction 02/16/2015   Essential hypertension 10/15/2014   Osteoarthritis of both knees 10/15/2014   Osteoarthritis of back 01/27/2014   Myopia of left eye 03/20/2013   Sensorineural hearing loss 12/27/2009   Left shoulder pain 04/20/2008   Benign paroxysmal positional vertigo 10/21/2007   ALLERGIC RHINITIS, SEASONAL 08/24/2004   Hyperlipidemia 04/21/2004   DEPRESSION 03/10/2004       Objective:     BP 126/65 (BP Location: Right Arm, Patient Position: Sitting, Cuff Size: Normal)   Pulse 76   Temp 98.7 F (37.1 C) (Oral)   Ht 5\' 4"  (1.626 m)   Wt 180 lb 11.2 oz (82 kg)   SpO2 99%   BMI 31.02 kg/m  BP Readings from Last 3 Encounters:  04/04/23 126/65  11/20/22 (!) 161/79  05/22/22 139/69   Wt Readings from Last 3 Encounters:  04/04/23 180 lb 11.2 oz (82 kg)  11/20/22 182 lb (82.6 kg)  05/22/22 182 lb 12.8 oz (82.9 kg)      Physical Exam Vitals reviewed.  HENT:     Right Ear: Tympanic membrane normal. There is impacted cerumen (Able to visualize TM after flushing R ear).     Left Ear: Tympanic membrane normal. There is no impacted cerumen.  Cardiovascular:     Rate and Rhythm: Normal rate and regular rhythm.     Heart sounds: Murmur (RUSB) heard.  Pulmonary:      Effort: Pulmonary effort is normal. No respiratory distress.     Breath sounds: Normal breath sounds. No wheezing.  Musculoskeletal:     Right knee: Crepitus present. Decreased range of motion. Tenderness present over the medial joint line and lateral joint line.     Left knee: No crepitus. Normal range of motion.  Skin:    General: Skin is warm and dry.  Neurological:     Mental Status: She is alert.      No results found for any visits on 04/04/23.  Last metabolic panel Lab Results  Component Value Date   GLUCOSE 122 (H) 11/10/2021   NA 142 11/10/2021   K 4.1 11/10/2021   CL 105 11/10/2021   CO2 20 11/10/2021   BUN 13 11/10/2021   CREATININE 0.79 11/10/2021   EGFR 79 11/10/2021   CALCIUM 10.3 11/10/2021   PROT 7.2 09/29/2018   ALBUMIN 4.6 09/29/2018   LABGLOB 2.6 09/29/2018   AGRATIO 1.8 09/29/2018   BILITOT 0.8 09/29/2018   ALKPHOS 98 09/29/2018   AST 15 09/29/2018   ALT 18 09/29/2018   ANIONGAP 9 09/06/2017      The 10-year ASCVD risk score (Arnett DK, et al., 2019) is: 14.9%    Assessment & Plan:   Problem List Items Addressed This Visit  Cardiovascular and Mediastinum   Essential hypertension - Primary (Chronic)   Patient has a history of poorly controlled hypertension.  She presented to the clinic today with an initial blood pressure reading of 151/67.  We rechecked her blood pressure and it improved to 126/65.  She is on a current regimen of spironolactone 50 mg, hydrochlorothiazide 25 mg, losartan 100 mg, and Lasix 20 mg.  Her blood pressure today appears well-controlled with the repeat pressure.  She denies symptoms of hypotension.  Patient stated that when she checks it at home sometimes it is in the 140s to 130s with a diastolic as low as 50.  She also reported a diet with increased processed foods and canned foods, we discussed checking foods for "hidden sodium". Plan: -Will obtain BMP -Will continue current regimen of spironolactone 50 mg,  hydrochlorothiazide 25 mg, Lasix 20 mg, losartan 100 mg      Relevant Orders   BMP8+Anion Gap     Nervous and Auditory   Cerumen impaction (Chronic)   Patient reports right greater than left ear "fullness".  She stated that she needs routine ear flushing roughly every 6 months and that she has been dealing with impacted cerumen for years now.  She tried eardrops over-the-counter but does not believe that she has tried Debrox before.  She denies using Q-tips. Initial evaluation of exam does repair right sided impacted cerumen, I was unable to visualize an tympanic membrane.  After flushing the right ear canal, I was able to visualize an intact right tympanic membrane.  She does not have impacted left-sided cerumen, left tympanic membrane was normal in appearance. Plan: -Patient was instructed to purchase Debrox drops and to continue avoiding Q-tips        Musculoskeletal and Integument   Osteoarthritis of both knees   Patient has a history of osteoarthritis of the bilateral knees.  Patient reports right-sided pain greater than left-sided knee pain.  She presented to the clinic today seeking nonsurgical options.  Patient has tried physical therapy in the past that did not help.  She has tried numerous steroid injections in the past as well which did help; however, patient would like a longer-lasting alternative.  She stated that she will try ibuprofen and Voltaren gel which do help her. Plan -Patient was instructed to call her orthopedic surgeon and inquire about gel injections into her knees (viscosupplementation).         Other   Heart murmur, systolic   Patient has a longstanding history of a right upper sternal border systolic murmur.  Patient reported that she has had this murmur since childhood.  She had an echocardiogram in 2020 which did not reveal aortic valvular disease.  She denies chest pain, shortness of breath, increase in lower extremity edema. Plan: -Will hold off on  echocardiogram at this time, obtain echocardiogram if patient has chest pain, shortness of breath, edema      Other Visit Diagnoses       Screening mammogram for breast cancer       Relevant Orders   MM 3D SCREENING MAMMOGRAM BILATERAL BREAST        Return in about 3 months (around 07/03/2023) for BP.    Faith Rogue, DO

## 2023-04-04 NOTE — Assessment & Plan Note (Signed)
Patient has a longstanding history of a right upper sternal border systolic murmur.  Patient reported that she has had this murmur since childhood.  She had an echocardiogram in 2020 which did not reveal aortic valvular disease.  She denies chest pain, shortness of breath, increase in lower extremity edema. Plan: -Will hold off on echocardiogram at this time, obtain echocardiogram if patient has chest pain, shortness of breath, edema

## 2023-04-04 NOTE — Assessment & Plan Note (Signed)
Patient has a history of poorly controlled hypertension.  She presented to the clinic today with an initial blood pressure reading of 151/67.  We rechecked her blood pressure and it improved to 126/65.  She is on a current regimen of spironolactone 50 mg, hydrochlorothiazide 25 mg, losartan 100 mg, and Lasix 20 mg.  Her blood pressure today appears well-controlled with the repeat pressure.  She denies symptoms of hypotension.  Patient stated that when she checks it at home sometimes it is in the 140s to 130s with a diastolic as low as 50.  She also reported a diet with increased processed foods and canned foods, we discussed checking foods for "hidden sodium". Plan: -Will obtain BMP -Will continue current regimen of spironolactone 50 mg, hydrochlorothiazide 25 mg, Lasix 20 mg, losartan 100 mg

## 2023-04-04 NOTE — Patient Instructions (Addendum)
Thank you, [name] for allowing [place] to provide your care today. Today we discussed knee pain, ear wax impaction, and blood pressure.    I have ordered the following labs for you:   Lab Orders         BMP8+Anion Gap        Follow up: 3 months    Remember: To call your orthopedic surgeon and ask them about the gel knee injections. If you purchase ear drops, please try to use debrox drops. You can find these over the counter at a pharmacy.   Should you have any questions or concerns please call the internal medicine clinic at [phone].     Please note that our late policy has changed.  If you are more than 15 minutes late to your appointment, you may be asked to reschedule your appointment.  Dr. Hessie Diener, D.O. [facility]

## 2023-04-05 LAB — BMP8+ANION GAP
Anion Gap: 16 mmol/L (ref 10.0–18.0)
BUN/Creatinine Ratio: 17 (ref 12–28)
BUN: 14 mg/dL (ref 8–27)
CO2: 20 mmol/L (ref 20–29)
Calcium: 9.7 mg/dL (ref 8.7–10.3)
Chloride: 103 mmol/L (ref 96–106)
Creatinine, Ser: 0.81 mg/dL (ref 0.57–1.00)
Glucose: 119 mg/dL — ABNORMAL HIGH (ref 70–99)
Potassium: 3.8 mmol/L (ref 3.5–5.2)
Sodium: 139 mmol/L (ref 134–144)
eGFR: 76 mL/min/{1.73_m2} (ref >59)

## 2023-04-05 NOTE — Progress Notes (Signed)
Internal Medicine Clinic Attending  Case discussed with the resident at the time of the visit.  We reviewed the resident's history and exam and pertinent patient test results.  I agree with the assessment, diagnosis, and plan of care documented in the resident's note.

## 2023-04-09 ENCOUNTER — Other Ambulatory Visit: Payer: Self-pay | Admitting: Internal Medicine

## 2023-04-09 DIAGNOSIS — R011 Cardiac murmur, unspecified: Secondary | ICD-10-CM

## 2023-04-09 DIAGNOSIS — H6121 Impacted cerumen, right ear: Secondary | ICD-10-CM

## 2023-04-09 DIAGNOSIS — M170 Bilateral primary osteoarthritis of knee: Secondary | ICD-10-CM

## 2023-04-09 DIAGNOSIS — Z1231 Encounter for screening mammogram for malignant neoplasm of breast: Secondary | ICD-10-CM

## 2023-04-09 DIAGNOSIS — I10 Essential (primary) hypertension: Secondary | ICD-10-CM

## 2023-04-25 DIAGNOSIS — R197 Diarrhea, unspecified: Secondary | ICD-10-CM | POA: Diagnosis not present

## 2023-04-25 DIAGNOSIS — R52 Pain, unspecified: Secondary | ICD-10-CM | POA: Diagnosis not present

## 2023-04-25 DIAGNOSIS — Z20822 Contact with and (suspected) exposure to covid-19: Secondary | ICD-10-CM | POA: Diagnosis not present

## 2023-04-25 DIAGNOSIS — R110 Nausea: Secondary | ICD-10-CM | POA: Diagnosis not present

## 2023-04-25 DIAGNOSIS — Z20828 Contact with and (suspected) exposure to other viral communicable diseases: Secondary | ICD-10-CM | POA: Diagnosis not present

## 2023-05-02 ENCOUNTER — Ambulatory Visit (INDEPENDENT_AMBULATORY_CARE_PROVIDER_SITE_OTHER): Payer: Self-pay

## 2023-05-02 ENCOUNTER — Encounter: Payer: Self-pay | Admitting: Sports Medicine

## 2023-05-02 ENCOUNTER — Ambulatory Visit (INDEPENDENT_AMBULATORY_CARE_PROVIDER_SITE_OTHER): Payer: Medicare HMO | Admitting: Sports Medicine

## 2023-05-02 ENCOUNTER — Ambulatory Visit (INDEPENDENT_AMBULATORY_CARE_PROVIDER_SITE_OTHER): Payer: Medicare HMO

## 2023-05-02 VITALS — Ht 65.0 in | Wt 181.4 lb

## 2023-05-02 DIAGNOSIS — M25561 Pain in right knee: Secondary | ICD-10-CM | POA: Diagnosis not present

## 2023-05-02 DIAGNOSIS — M25562 Pain in left knee: Secondary | ICD-10-CM

## 2023-05-02 DIAGNOSIS — G8929 Other chronic pain: Secondary | ICD-10-CM

## 2023-05-02 DIAGNOSIS — M170 Bilateral primary osteoarthritis of knee: Secondary | ICD-10-CM

## 2023-05-02 MED ORDER — MELOXICAM 15 MG PO TABS: 15.0000 mg | ORAL_TABLET | Freq: Every day | ORAL | 0 refills | Status: DC

## 2023-05-02 NOTE — Progress Notes (Signed)
Office Visit Note   Patient: Victoria Campos           Date of Birth: 11/04/1950           MRN: [id number] Visit Date: 05/02/2023              Requested by: Champ Mungo, DO [address] [place],  [place] [address] PCP: Champ Mungo, DO  Medical Resident, Sports Medicine Fellow - Attending Physician Addendum:   I have independently interviewed and examined the patient myself. I have discussed the above with the original author and agree with their documentation. My edits for correction/addition/clarification have been made, see any changes above and below.   In summary, very pleasant 73 year-old female with chronic bilateral knee pain with severe osteoarthritis of the tibiofemoral and patellofemoral joint spaces.  We discussed trialing injection therapy, beginning with corticosteroid and considering viscosupplementation.  She will take a few weeks to think about this and may schedule this at her leisure if interested.  We will start her on Meloxicam 15mg  every day only as needed for bad days for pain.  Did discuss that based on the severity of her arthritis, she could be a candidate for knee replacement and would meet criteria, but she would like to hold off on this as long as possible which I feel is very fair.  We will start with above first and see how she responds. F/u as needed. ____________ -No tobacco use -No diabetes, CAD -BMI: Body mass index is 30.19 kg/m.  Madelyn Brunner, DO Primary Care Sports Medicine Physician  [facility] - Orthopedics   Assessment & Plan: Visit Diagnoses:  1. Bilateral primary osteoarthritis of knee   2. Chronic pain of right knee   3. Chronic pain of left knee    Plan: Discussed with patient nature of her knee pain is likely secondary to severe bone-on-bone osteoarthritis.  Did discuss that patient's pain will likely persist and final therapeutic option would be a knee replacement.  Patient would like to avoid surgery at this time.  Did  discuss with patient options of steroid injections given that it may be her first time she may have some relief.  Patient would like to think about this and let us know in the coming weeks if interested. In the meantime, did discuss with patient and will start meloxicam so that she may have some therapeutic relief if necessary.  Did advise against taking for too many days in a row.  Patient understanding and agreeable with plan  Follow-Up Instructions: as needed for knees; consider steroid injections if desires   Orders:  Orders Placed This Encounter  Procedures   XR Knee Complete 4 Views Right   XR Knee Complete 4 Views Left   Meds ordered this encounter  Medications   meloxicam (MOBIC) 15 MG tablet    Sig: Take 1 tablet (15 mg total) by mouth daily.    Dispense:  30 tablet    Refill:  0      Procedures: No procedures performed   Clinical Data: No additional findings.   Subjective: Chief Complaint  Patient presents with   Right Knee - Pain   Left Knee - Pain    Pt presenting for bilateral knee pain. Patient notes that she has been dealing with the pain for quite a while now and was seen approximately a year and a half ago for something similar.  Patient is never had any steroids injections into her knees.  Patient states that she has been taking ibuprofen usually at night so that she can sleep but is stating that is not every day.  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  Patient otherwise has no other concerns at this time and would like to avoid surgery as much as possible    Review of Systems   Objective: Vital Signs: Ht 5\' 5"  (1.651 m)   Wt 181 lb 6.4 oz (82.3 kg)   BMI 30.19 kg/m   Physical Exam  Ortho Exam Bilateral rash: Inspection reveals no gross abnormality of the bilateral knees.  There is some tenderness to palpation along the medial lateral joint lines, no tenderness over the inferior superior aspect of the  patella.  Range of motion is full with some grinding noted on flexion and extension.  Strength is 5 out of 5 with flexion extension against resistance though pain noted with flexion  Specialty Comments:  No specialty comments available.  Imaging: XR Knee Complete 4 Views Left Result Date: 05/02/2023 Patient with bilateral severe osteoarthritis.  There is notable sclerotic changes throughout the tibia as well as some tibial subluxation bilaterally.  There is noted spurring along the medial lateral aspects of the joint as well as on the superior aspect of the right patella.  XR Knee Complete 4 Views Right Result Date: 05/02/2023 Patient with bilateral severe osteoarthritis.  There is notable sclerotic changes throughout the tibia as well as some tibial subluxation bilaterally.  There is noted spurring along the medial lateral aspects of the joint as well as on the superior aspect of the right patella.    PMFS History: Patient Active Problem List   Diagnosis Date Noted   Heart murmur, systolic 04/04/2023   Encounter for work capability assessment 05/22/2022   Insomnia 04/14/2021   Chronic heart failure with preserved ejection fraction (HCC) 09/29/2018   Cerumen impaction 02/16/2015   Essential hypertension 10/15/2014   Osteoarthritis of both knees 10/15/2014   Osteoarthritis of back 01/27/2014   Myopia of left eye 03/20/2013   Sensorineural hearing loss 12/27/2009   Left shoulder pain 04/20/2008   Benign paroxysmal positional vertigo 10/21/2007   ALLERGIC RHINITIS, SEASONAL 08/24/2004   Hyperlipidemia 04/21/2004   DEPRESSION 03/10/2004   Past Medical History:  Diagnosis Date   ANXIETY 03/10/2004   Qualifier: Diagnosis of  By: Barbaraann Barthel MD, [place]     Arthritis    Heart murmur    Hypercholesteremia    Hypertension    Insomnia    Pulmonary embolism (HCC) 09/23/2011   Acute segmental bilateral segmental PE, neg dopplers, neg coag w/u     Family History  Problem Relation Age of  Onset   Heart attack Father        died at age 62   Heart disease Father    Colon cancer Sister    Cancer Sister        colon   Cancer Sister        breast   Cancer Sister    Colon cancer Brother    Cancer Brother        colon   Esophageal cancer Neg Hx    Stomach cancer Neg Hx     Past Surgical History:  Procedure Laterality Date   arm surgery     torn ligamint   [place]     removed   COLONOSCOPY  04/2011   Dr.Aggarwal-normal colon prep good   tubal ligation  Social History   Occupational History   Not on file  Tobacco Use   Smoking status: Former    Current packs/day: 0.00    Types: Cigarettes    Quit date: 03/12/1968    Years since quitting: 55.1   Smokeless tobacco: Never  Vaping Use   Vaping status: Never Used  Substance and Sexual Activity   Alcohol use: Yes    Alcohol/week: 1.0 standard drink of alcohol    Types: 1 Glasses of wine per week    Comment: occasional   Drug use: No   Sexual activity: Not Currently

## 2023-05-02 NOTE — Progress Notes (Signed)
Patient says that she has had pain in both knees since her last visit in 2023 as it never improved. Her right knee is worse than the left knee. She is inquiring about gel injections. Patient denies any falls or injuries. Her pain is worse in cold and rainy weather. She takes Ibuprofen as needed as well as ice. She also uses pain patches on both knees and says that she uses them for awhile but then takes breaks for some time as they become less effective if she uses them continuously. She says that she has constant aching pain.

## 2023-05-06 DIAGNOSIS — H52223 Regular astigmatism, bilateral: Secondary | ICD-10-CM | POA: Diagnosis not present

## 2023-05-06 DIAGNOSIS — H5211 Myopia, right eye: Secondary | ICD-10-CM | POA: Diagnosis not present

## 2023-05-06 DIAGNOSIS — H5202 Hypermetropia, left eye: Secondary | ICD-10-CM | POA: Diagnosis not present

## 2023-05-23 ENCOUNTER — Ambulatory Visit: Payer: Medicare HMO | Admitting: Sports Medicine

## 2023-05-24 ENCOUNTER — Other Ambulatory Visit: Payer: Self-pay | Admitting: Internal Medicine

## 2023-05-24 NOTE — Telephone Encounter (Signed)
 Medication sent to pharmacy

## 2023-05-30 ENCOUNTER — Other Ambulatory Visit: Payer: Self-pay | Admitting: Family Medicine

## 2023-06-07 ENCOUNTER — Telehealth: Payer: Self-pay | Admitting: Sports Medicine

## 2023-06-07 ENCOUNTER — Other Ambulatory Visit: Payer: Self-pay | Admitting: Sports Medicine

## 2023-06-07 ENCOUNTER — Ambulatory Visit: Admitting: Sports Medicine

## 2023-06-07 NOTE — Telephone Encounter (Signed)
 Patient called. She would like a refill on her muscle relaxer. Her cb# [phone]

## 2023-06-10 NOTE — Telephone Encounter (Signed)
 Called patient and left a voicemail to give the office a call regarding her contacting us last Friday. Voicemail set up was generic, so did not leave a message with specific details.  Per Dr. Shon Baton last message: I have not written prescription for muscle relaxer for patient before. I cannot see any recent scripts. We have filled Meloxicam (anti-inflammatory before). If she could elaborate on what muscle relaxer and dosing she is on, we can fill this for her or have her contact her primary/whatever physician has written this for her before.

## 2023-07-03 ENCOUNTER — Ambulatory Visit: Admitting: Internal Medicine

## 2023-07-03 VITALS — BP 133/72 | HR 70 | Temp 98.2°F | Ht 65.0 in | Wt 181.9 lb

## 2023-07-03 DIAGNOSIS — I10 Essential (primary) hypertension: Secondary | ICD-10-CM

## 2023-07-03 DIAGNOSIS — E785 Hyperlipidemia, unspecified: Secondary | ICD-10-CM

## 2023-07-03 DIAGNOSIS — I5032 Chronic diastolic (congestive) heart failure: Secondary | ICD-10-CM | POA: Diagnosis not present

## 2023-07-03 DIAGNOSIS — E7800 Pure hypercholesterolemia, unspecified: Secondary | ICD-10-CM

## 2023-07-03 DIAGNOSIS — I110 Hypertensive heart disease with heart failure: Secondary | ICD-10-CM

## 2023-07-03 DIAGNOSIS — M170 Bilateral primary osteoarthritis of knee: Secondary | ICD-10-CM

## 2023-07-03 DIAGNOSIS — Z0000 Encounter for general adult medical examination without abnormal findings: Secondary | ICD-10-CM

## 2023-07-03 MED ORDER — HYDROCHLOROTHIAZIDE 25 MG PO TABS
25.0000 mg | ORAL_TABLET | Freq: Every day | ORAL | 3 refills | Status: DC
Start: 2023-07-03 — End: 2023-11-06

## 2023-07-03 MED ORDER — EZETIMIBE 10 MG PO TABS: 10.0000 mg | ORAL_TABLET | Freq: Every day | ORAL | 0 refills | Status: DC

## 2023-07-03 MED ORDER — MELOXICAM 15 MG PO TABS: 15.0000 mg | ORAL_TABLET | Freq: Every day | ORAL | 0 refills | Status: DC

## 2023-07-03 MED ORDER — ATORVASTATIN CALCIUM 80 MG PO TABS
80.0000 mg | ORAL_TABLET | Freq: Every day | ORAL | 3 refills | Status: DC
Start: 2023-07-03 — End: 2023-07-19

## 2023-07-03 NOTE — Patient Instructions (Signed)
 It was a pleasure to care for you today!  Please come back to clinic in 2 weeks for recheck of your blood pressure. Make sure to bring a list of all of your medicines at that time!  My best, Dr. Rozelle Corning

## 2023-07-03 NOTE — Progress Notes (Unsigned)
   CC: routine check up  HPI:  [name] is a 73 y.o. female with past medical history as detailed below who presents for routine follow up. Please see problem based charting for detailed assessment and plan.  Past Medical History:  Diagnosis Date   ANXIETY 03/10/2004   Qualifier: Diagnosis of  By: Katheleen Palmer MD, [place]     Arthritis    Heart murmur    Hypercholesteremia    Hypertension    Insomnia    Pulmonary embolism (HCC) 09/23/2011   Acute segmental bilateral segmental PE, neg dopplers, neg coag w/u    Review of Systems:  Negative unless otherwise stated.  Physical Exam:  Vitals:   07/03/23 1430 07/03/23 1525  BP: (!) 161/79 133/72  Pulse: 73 70  Temp: 98.2 F (36.8 C)   TempSrc: Oral   SpO2: 100%   Weight: 181 lb 14.4 oz (82.5 kg)   Height: 5\' 5"  (1.651 m)    Constitutional:Appears well, in no acute distress. Cardio:Regular rate and rhythm. No murmurs, rubs, or gallops. Pulm:Clear to auscultation bilaterally. Normal work of breathing on room air. BJY:NWGNFAOZ for extremity edema. Bony enlargements of DIP joints on bilateral hands that are not painful. Skin:Warm and dry. Neuro:Alert and oriented x3. No focal deficit noted. Psych:Pleasant mood and affect.  Assessment & Plan:   See Encounters Tab for problem based charting.  Chronic heart failure with preserved ejection fraction (HCC) Clinically euvolemic.  Essential hypertension BP 161/79, on recheck 133/72. OP regimen is furosemide  20 mg daily, hydrochlorothiazide  25 mg daily, losartan  100 mg daily, spironolactone  50 mg daily however she does not remember exactly what she is taking, but does say she is not taking furosemide . When she checks at home her BP runs in 130s-140s/70s-80s. BMP last checked 03/2023 with electrolytes and renal function WNL. Plan:F/u in 2 weeks for BP recheck; I have asked that she bring her medication list at that time so that we can appropriately titrate her medications at that  time if needed.  Hyperlipidemia OP regimen is atorvastatin  80 mg daily, zetia  10 mg daily which she does believe she is compliant with. Lipid panel last checked 10/2021 with LDL above goal, 118.  Plan:Lipid panel today. Continue atorvastatin  80 mg daily, zetia  10 mg daily. Confirm these medications at her next OV and adjust regimen if indicated by lipid panel results.  Osteoarthritis of both knees Plan:Meloxicam  refilled. Advised that patient that to continue to prescribe this medication, we will need to make sure her BP is well controlled.  Healthcare maintenance Patient is agreeable to updating her tetanus booster at next OV.  Patient discussed with Dr. Adriane Albe

## 2023-07-04 ENCOUNTER — Encounter: Payer: Self-pay | Admitting: Internal Medicine

## 2023-07-04 LAB — LIPID PANEL
Chol/HDL Ratio: 2.7 ratio (ref 0.0–4.4)
Cholesterol, Total: 191 mg/dL (ref 100–199)
HDL: 70 mg/dL (ref >39)
LDL Chol Calc (NIH): 113 mg/dL — ABNORMAL HIGH (ref 0–99)
Triglycerides: 43 mg/dL (ref 0–149)
VLDL Cholesterol Cal: 8 mg/dL (ref 5–40)

## 2023-07-04 NOTE — Assessment & Plan Note (Signed)
 Patient is agreeable to updating her tetanus booster at next OV.

## 2023-07-04 NOTE — Assessment & Plan Note (Signed)
 BP 161/79, on recheck 133/72. OP regimen is furosemide  20 mg daily, hydrochlorothiazide  25 mg daily, losartan  100 mg daily, spironolactone  50 mg daily however she does not remember exactly what she is taking, but does say she is not taking furosemide . When she checks at home her BP runs in 130s-140s/70s-80s. BMP last checked 03/2023 with electrolytes and renal function WNL. Plan:F/u in 2 weeks for BP recheck; I have asked that she bring her medication list at that time so that we can appropriately titrate her medications at that time if needed.

## 2023-07-04 NOTE — Assessment & Plan Note (Signed)
 OP regimen is atorvastatin  80 mg daily, zetia  10 mg daily which she does believe she is compliant with. Lipid panel last checked 10/2021 with LDL above goal, 118.  Plan:Lipid panel today. Continue atorvastatin  80 mg daily, zetia  10 mg daily. Confirm these medications at her next OV and adjust regimen if indicated by lipid panel results.

## 2023-07-04 NOTE — Assessment & Plan Note (Signed)
 Plan:Meloxicam  refilled. Advised that patient that to continue to prescribe this medication, we will need to make sure her BP is well controlled.

## 2023-07-04 NOTE — Assessment & Plan Note (Signed)
Clinically euvolemic 

## 2023-07-12 NOTE — Addendum Note (Signed)
 Addended by: Tod Forward C on: 07/12/2023 02:49 PM   Modules accepted: Level of Service

## 2023-07-12 NOTE — Progress Notes (Signed)
 Internal Medicine Clinic Attending  Case discussed with the resident at the time of the visit.  We reviewed the resident's history and exam and pertinent patient test results.  I agree with the assessment, diagnosis, and plan of care documented in the resident's note.

## 2023-07-19 ENCOUNTER — Ambulatory Visit (INDEPENDENT_AMBULATORY_CARE_PROVIDER_SITE_OTHER): Admitting: Student

## 2023-07-19 VITALS — BP 142/67 | HR 71 | Temp 97.8°F | Ht 65.0 in | Wt 181.8 lb

## 2023-07-19 DIAGNOSIS — I10 Essential (primary) hypertension: Secondary | ICD-10-CM | POA: Diagnosis not present

## 2023-07-19 DIAGNOSIS — E7800 Pure hypercholesterolemia, unspecified: Secondary | ICD-10-CM

## 2023-07-19 DIAGNOSIS — E785 Hyperlipidemia, unspecified: Secondary | ICD-10-CM

## 2023-07-19 MED ORDER — ROSUVASTATIN CALCIUM 40 MG PO TABS: 20.0000 mg | ORAL_TABLET | Freq: Every day | ORAL | 3 refills | Status: DC

## 2023-07-19 NOTE — Progress Notes (Signed)
 CC: 2 weeks follow-up for blood pressure HPI:  Victoria Campos is a 73 y.o. female living with a history stated below and presents today for  2 weeks follow-up for blood pressure. Please see problem based assessment and plan for additional details.  Patient was last seen on 04/23 in [facility]. Past Medical History:  Diagnosis Date   ANXIETY 03/10/2004   Qualifier: Diagnosis of  By: Katheleen Palmer MD, [place]     Arthritis    Heart murmur    Hypercholesteremia    Hypertension    Insomnia    Pulmonary embolism (HCC) 09/23/2011   Acute segmental bilateral segmental PE, neg dopplers, neg coag w/u     Current Outpatient Medications on File Prior to Visit  Medication Sig Dispense Refill   diclofenac  Sodium (VOLTAREN ) 1 % GEL Apply 4 g topically 4 (four) times daily. 150 g 1   ezetimibe  (ZETIA ) 10 MG tablet Take 1 tablet (10 mg total) by mouth daily. 90 tablet 0   fluticasone  (FLONASE ) 50 MCG/ACT nasal spray USE 2 SPRAYS IN EACH NOSTRIL EVERY DAY 48 g 3   furosemide  (LASIX ) 20 MG tablet TAKE 1 TABLET EVERY DAY 90 tablet 3   hydrochlorothiazide  (HYDRODIURIL ) 25 MG tablet Take 1 tablet (25 mg total) by mouth daily. 90 tablet 3   HYDROcodone -acetaminophen  (NORCO/VICODIN) 5-325 MG tablet Take 0.5-1 tablets by mouth 2 (two) times daily as needed. 4 tablet 0   ibuprofen  (IBU) 600 MG tablet TAKE 1 TABLET EVERY 6 HOURS AS NEEDED 90 tablet 0   losartan  (COZAAR ) 100 MG tablet TAKE 1 TABLET EVERY DAY 90 tablet 3   meclizine  (ANTIVERT ) 12.5 MG tablet Take 1 tablet (12.5 mg total) by mouth 3 (three) times daily as needed for dizziness. 28 tablet 0   meloxicam  (MOBIC ) 15 MG tablet Take 1 tablet (15 mg total) by mouth daily. 30 tablet 0   spironolactone  (ALDACTONE ) 50 MG tablet Take 1 tablet (50 mg total) by mouth daily. 30 tablet 11   No current facility-administered medications on file prior to visit.     Review of Systems: ROS negative except for what is noted on the assessment and plan.  Vitals:    07/19/23 0848 07/19/23 0922  BP: (!) 144/66 (!) 142/67  Pulse: 70 71  Temp: 97.8 F (36.6 C)   TempSrc: Oral   Weight: 181 lb 12.8 oz (82.5 kg)   Height: 5\' 5"  (1.651 m)   PF: 100 L/min     Physical Exam: Well-appearing, NAD. Heart with regular rate and rhythm no murmurs. Clear bilateral lungs.  Assessment & Plan:   Essential hypertension She has been very compliant with her medicines, blood pressure well-controlled today.  Patient brought  the medicines that she is taking daily includes HCTZ 25 mg and spironolactone  50 mg, atorvastatin  40 mg and Zetia  10 mg Will not evaluate for secondary causes of hypertension, as BP is well-controlled. -Will check BMP today. - Continue with HCTZ 25 mg, and spironolactone  50 mg.  Hyperlipidemia LDL 113, still remains above goal.  She is on atorvastatin  80 mg and Zetia  10 mg.  On medication dispense history, she has been pretty compliant with both atorvastatin  and Zetia . Will switch to different class of statin, will recheck lipids in 6-12 months.  If still elevated, could consider PSK9  inhibitors. -Discontinue atorvastatin  - Start rosuvastatin  40 mg. - Continue Zetia  10 mg.  # Health maintenance Medicare will not cover for tetanus booster here in the clinic unless goes to the retail  pharmacy.   Patient is amenable to waiting until next office visit, as we would have moved to a new location which is closer to the pharmacy. - Tetanus booster @next  office visit  Patient discussed with Dr. Rosalyn Combes, MD [facility] Internal Medicine, PGY-1 Pager: [phone] Date 07/19/2023 Time 9:43 AM

## 2023-07-19 NOTE — Patient Instructions (Signed)
 Thank you, Victoria Campos for allowing us  to provide your care today.  1.  For your blood pressure, continue HCTZ 25 mg and spironolactone .  Call you with the results of the lab. 2.  For your cholesterol, please stop taking atorvastatin .   3. Start rosuvastatin  40 mg once a day.  Continue to take Zetia  10 mg once a day. 4.    I have ordered the following labs for you:  Lab Orders  No laboratory test(s) ordered today     Tests ordered today:    Referrals ordered today:   Referral Orders  No referral(s) requested today     I have ordered the following medication/changed the following medications:   Stop the following medications: There are no discontinued medications.   Start the following medications: No orders of the defined types were placed in this encounter.    Follow up: 3 months      Should you have any questions or concerns please call the internal medicine clinic at [phone].     Marni Sins, MD  [facility]

## 2023-07-19 NOTE — Assessment & Plan Note (Addendum)
 She has been very compliant with her medicines, blood pressure well-controlled today.  Patient brought all the medicines that she is taking daily; HCTZ 25 mg and spironolactone  50 mg, atorvastatin  40 mg and Zetia  10 mg Will not evaluate for secondary causes of hypertension, as BP is well-controlled. -Will check BMP today. - Continue with HCTZ 25 mg, and spironolactone  50 mg.

## 2023-07-19 NOTE — Addendum Note (Signed)
 Addended by: Markice Torbert L on: 07/19/2023 10:21 AM   Modules accepted: Level of Service

## 2023-07-19 NOTE — Assessment & Plan Note (Addendum)
 LDL 113, still remains above goal.  She is on atorvastatin  80 mg and Zetia  10 mg.  On medication dispense history, she has been pretty compliant with both atorvastatin  and Zetia . Will switch to different class of statin, will recheck lipids in 6-12 months.  If still elevated, could consider PSK9  inhibitors. -Discontinue atorvastatin  - Start rosuvastatin  40 mg. - Continue Zetia  10 mg.

## 2023-07-20 LAB — BMP8+ANION GAP
Anion Gap: 18 mmol/L (ref 10.0–18.0)
BUN/Creatinine Ratio: 15 (ref 12–28)
BUN: 13 mg/dL (ref 8–27)
CO2: 20 mmol/L (ref 20–29)
Calcium: 10 mg/dL (ref 8.7–10.3)
Chloride: 103 mmol/L (ref 96–106)
Creatinine, Ser: 0.85 mg/dL (ref 0.57–1.00)
Glucose: 98 mg/dL (ref 70–99)
Potassium: 4.2 mmol/L (ref 3.5–5.2)
Sodium: 141 mmol/L (ref 134–144)
eGFR: 72 mL/min/1.73

## 2023-07-22 ENCOUNTER — Encounter: Payer: Self-pay | Admitting: Student

## 2023-07-22 ENCOUNTER — Ambulatory Visit
Admission: RE | Admit: 2023-07-22 | Discharge: 2023-07-22 | Disposition: A | Discharge status: Home / Self Care | Source: Ambulatory Visit | Attending: Internal Medicine | Admitting: Internal Medicine

## 2023-07-22 DIAGNOSIS — Z1231 Encounter for screening mammogram for malignant neoplasm of breast: Secondary | ICD-10-CM | POA: Diagnosis not present

## 2023-07-25 ENCOUNTER — Other Ambulatory Visit: Payer: Self-pay | Admitting: Internal Medicine

## 2023-07-25 DIAGNOSIS — R928 Other abnormal and inconclusive findings on diagnostic imaging of breast: Secondary | ICD-10-CM

## 2023-07-30 NOTE — Progress Notes (Signed)
 Internal Medicine Clinic Attending  Case discussed with the resident at the time of the visit.  We reviewed the resident's history and exam and pertinent patient test results.  I agree with the assessment, diagnosis, and plan of care documented in the resident's note.   To clarify, rosuvastatin  20mg  daily was ordered. If she is tolerating this, I recommend increasing to 40mg  daily at next visit.

## 2023-07-31 ENCOUNTER — Encounter: Payer: Self-pay | Admitting: Internal Medicine

## 2023-07-31 ENCOUNTER — Ambulatory Visit: Payer: Self-pay | Admitting: Internal Medicine

## 2023-07-31 NOTE — Telephone Encounter (Signed)
 Unsuccessful in contacting patient to confirm she had spoken with radiology regarding her mammogram results and recommended follow-up scans. At this time I do not see scheduled dates/times for these but they have been ordered.  I have reached out to Ms. Victoria Campos via [employer] requesting that she contact the clinic if she has not received a call to schedule these follow up tests so that we can assist her in scheduling these.  Malen Scudder, DO

## 2023-08-01 ENCOUNTER — Other Ambulatory Visit: Payer: Self-pay | Admitting: Internal Medicine

## 2023-08-13 NOTE — Telephone Encounter (Unsigned)
 Copied from CRM [id number]. Topic: Clinical - Medication Refill >> Aug 13, 2023  3:07 PM Retta Caster wrote: Medication: rosuvastatin  (CRESTOR ) 40 MG tablet   Has the patient contacted their pharmacy? Yes (Agent: If no, request that the patient contact the pharmacy for the refill. If patient does not wish to contact the pharmacy document the reason why and proceed with request.) (Agent: If yes, when and what did the pharmacy advise?)  This is the patient's preferred pharmacy:    [id number] - [place], [place] - [address] AT [address] [facility] [address] [facility] [address] [place] [address] Phone: [phone] Fax: [phone]  Is this the correct pharmacy for this prescription? Yes If no, delete pharmacy and type the correct one.   Has the prescription been filled recently? Yes  Is the patient out of the medication? Yes  Has the patient been seen for an appointment in the last year OR does the patient have an upcoming appointment? Yes  Can we respond through MyChart? Yes  Agent: Please be advised that Rx refills may take up to 3 business days. We ask that you follow-up with your pharmacy.

## 2023-08-16 ENCOUNTER — Ambulatory Visit
Admission: RE | Admit: 2023-08-16 | Discharge: 2023-08-16 | Disposition: A | Discharge status: Home / Self Care | Source: Ambulatory Visit | Attending: Internal Medicine | Admitting: Internal Medicine

## 2023-08-16 DIAGNOSIS — R928 Other abnormal and inconclusive findings on diagnostic imaging of breast: Secondary | ICD-10-CM

## 2023-08-31 ENCOUNTER — Other Ambulatory Visit: Payer: Self-pay | Admitting: Internal Medicine

## 2023-09-01 ENCOUNTER — Other Ambulatory Visit: Payer: Self-pay | Admitting: Internal Medicine

## 2023-09-12 ENCOUNTER — Other Ambulatory Visit: Payer: Self-pay

## 2023-09-12 ENCOUNTER — Ambulatory Visit: Payer: Self-pay | Admitting: Student

## 2023-09-12 ENCOUNTER — Encounter: Payer: Self-pay | Admitting: Student

## 2023-09-12 VITALS — BP 137/59 | HR 88 | Temp 97.9°F | Ht 65.0 in | Wt 181.4 lb

## 2023-09-12 DIAGNOSIS — M79672 Pain in left foot: Secondary | ICD-10-CM | POA: Diagnosis not present

## 2023-09-12 NOTE — Patient Instructions (Signed)
 Thank you so much for coming to the clinic today!   I think the best thing to help with the pain would be a metatarsal pad that you can pick up at any shoe store or [facility]!  If you have any questions please feel free to the call the clinic at anytime at [phone]. It was a pleasure seeing you!  Best, Dr. Rayson Rando

## 2023-09-12 NOTE — Assessment & Plan Note (Addendum)
 Patient presents for an acute visit for a lump on the plantar surface of her left foot, located on the third metatarsal.  She states that she has had this for quite some time, however within the last week or so been intermittently painful for her especially when wearing specific types of shoes.  She denies any fevers, cuts to the areas.  She denies any erythema, or systemic symptoms such as fevers, chills.  On my exam, there is a very small lump in that area, nonfluctuant.  Etiology currently unclear, however may just be due to the kinds of shoes that she is wearing.  Encouraged patient to wear metatarsal pads.  Plan:  - Metatarsal Pads

## 2023-09-12 NOTE — Progress Notes (Signed)
 CC: Bump on foot   HPI:  Victoria Campos is a 73 y.o. female living with a history stated below and presents today for bump on her foot. Please see problem based assessment and plan for additional details.  Past Medical History:  Diagnosis Date   ANXIETY 03/10/2004   Qualifier: Diagnosis of  By: Loretha MD, [place]     Arthritis    Heart murmur    Hypercholesteremia    Hypertension    Insomnia    Pulmonary embolism (HCC) 09/23/2011   Acute segmental bilateral segmental PE, neg dopplers, neg coag w/u     Current Outpatient Medications on File Prior to Visit  Medication Sig Dispense Refill   diclofenac  Sodium (VOLTAREN ) 1 % GEL Apply 4 g topically 4 (four) times daily. 150 g 1   ezetimibe  (ZETIA ) 10 MG tablet Take 1 tablet (10 mg total) by mouth daily. 90 tablet 0   fluticasone  (FLONASE ) 50 MCG/ACT nasal spray USE 2 SPRAYS IN EACH NOSTRIL EVERY DAY 48 g 3   furosemide  (LASIX ) 20 MG tablet TAKE 1 TABLET EVERY DAY 90 tablet 3   hydrochlorothiazide  (HYDRODIURIL ) 25 MG tablet Take 1 tablet (25 mg total) by mouth daily. 90 tablet 3   HYDROcodone -acetaminophen  (NORCO/VICODIN) 5-325 MG tablet Take 0.5-1 tablets by mouth 2 (two) times daily as needed. 4 tablet 0   IBU 600 MG tablet TAKE 1 TABLET EVERY 6 HOURS AS NEEDED 90 tablet 0   losartan  (COZAAR ) 100 MG tablet TAKE 1 TABLET EVERY DAY 90 tablet 3   meclizine  (ANTIVERT ) 12.5 MG tablet Take 1 tablet (12.5 mg total) by mouth 3 (three) times daily as needed for dizziness. 28 tablet 0   meloxicam  (MOBIC ) 15 MG tablet TAKE 1 TABLET (15 MG TOTAL) BY MOUTH DAILY. 30 tablet 0   rosuvastatin  (CRESTOR ) 40 MG tablet Take 0.5 tablets (20 mg total) by mouth daily. 90 tablet 3   spironolactone  (ALDACTONE ) 50 MG tablet Take 1 tablet (50 mg total) by mouth daily. 30 tablet 11   No current facility-administered medications on file prior to visit.    Family History  Problem Relation Age of Onset   Heart attack Father        died at age 73    Heart disease Father    Colon cancer Sister    Cancer Sister        colon   Breast cancer Sister    Cancer Sister    Colon cancer Brother    Cancer Brother        colon   Esophageal cancer Neg Hx    Stomach cancer Neg Hx     Social History   Socioeconomic History   Marital status: Single    Spouse name: Not on file   Number of children: Not on file   Years of education: Not on file   Highest education level: Not on file  Occupational History   Not on file  Tobacco Use   Smoking status: Former    Current packs/day: 0.00    Types: Cigarettes    Quit date: 03/12/1968    Years since quitting: 55.5   Smokeless tobacco: Never  Vaping Use   Vaping status: Never Used  Substance and Sexual Activity   Alcohol use: Yes    Alcohol/week: 1.0 standard drink of alcohol    Types: 1 Glasses of wine per week    Comment: occasional   Drug use: No   Sexual activity: Not Currently  Other  Topics Concern   Not on file  Social History Narrative   Current Social History 09/07/2020        Patient lives with family in a home which is 1 story. There is one step up to the entrance the patient uses.       Patient's method of transportation is personal car and her daughter drives her to appointments as well.      The highest level of education was college diploma.      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      Identified important Relationships are God and her children       Pets : None       Interests / Fun: Traveling with her family; cookouts       Current Stressors: None           Social Drivers of Corporate investment banker Strain: Low Risk  (10/10/2021)   Overall Financial Resource Strain (CARDIA)    Difficulty of Paying Living Expenses: Not hard at all  Food Insecurity: No Food Insecurity (02/09/2022)   Hunger Vital Sign    Worried About Running Out of Food in the Last Year: Never true    Ran Out of Food in the Last Year: Never  true  Transportation Needs: No Transportation Needs (02/09/2022)   PRAPARE - Administrator, Civil Service (Medical): No    Lack of Transportation (Non-Medical): No  Physical Activity: Insufficiently Active (10/10/2021)   Exercise Vital Sign    Days of Exercise per Week: 7 days    Minutes of Exercise per Session: 20 min  Stress: No Stress Concern Present (10/10/2021)   [employer] of Occupational Health - Occupational Stress Questionnaire    Feeling of Stress : Not at all  Social Connections: Moderately Integrated (02/09/2022)   Social Connection and Isolation Panel    Frequency of Communication with Friends and Family: More than three times a week    Frequency of Social Gatherings with Friends and Family: More than three times a week    Attends Religious Services: More than 4 times per year    Active Member of [employer] or Organizations: Yes    Attends Banker Meetings: 1 to 4 times per year    Marital Status: Divorced  Catering manager Violence: Not At Risk (02/09/2022)   Humiliation, Afraid, Rape, and Kick questionnaire    Fear of Current or Ex-Partner: No    Emotionally Abused: No    Physically Abused: No    Sexually Abused: No    Review of Systems: ROS negative except for what is noted on the assessment and plan.  Vitals:   09/12/23 0919  BP: (!) 137/59  Pulse: 88  Temp: 97.9 F (36.6 C)  TempSrc: Oral  SpO2: 99%  Weight: 181 lb 6.4 oz (82.3 kg)  Height: 5' 5 (1.651 m)    Physical Exam: Constitutional: well-appearing female  in no acute distress Cardiovascular: regular rate and rhythm, no m/r/g Pulmonary/Chest: normal work of breathing on room air, lungs clear to auscultation bilaterally Abdominal: soft, non-tender, non-distended MSK: normal bulk and tone, small swelling noted on L plantar surface at third metatarsal Neurological: alert & oriented x 3, 5/5 strength in bilateral upper and lower extremities, normal gait   Assessment & Plan:    Foot pain, left Patient presents for an acute visit for a lump on the plantar surface of her left  foot, located on the third metatarsal.  She states that she has had this for quite some time, however within the last week or so been intermittently painful for her especially when wearing specific types of shoes.  She denies any fevers, cuts to the areas.  She denies any erythema, or systemic symptoms such as fevers, chills.  On my exam, there is a very small lump in that area, nonfluctuant.  Etiology currently unclear, however may just be due to the kinds of shoes that she is wearing.  Encouraged patient to wear metatarsal pads.  Plan:  - Metatarsal Pads   Patient discussed with Dr. Jeanelle Roetta Chars, M.D. [facility] Internal Medicine, PGY-3 Pager: [phone] Date 09/12/2023 Time 6:05 PM

## 2023-09-17 NOTE — Progress Notes (Signed)
 Internal Medicine Clinic Attending  I was physically present during the key portions of the resident provided service and participated in the medical decision making of patient's management care. I reviewed pertinent patient test results.  The assessment, diagnosis, and plan were formulated together and I agree with the documentation in the resident's note.  Carney Living, MD

## 2023-10-07 ENCOUNTER — Other Ambulatory Visit: Payer: Self-pay | Admitting: Student

## 2023-10-15 ENCOUNTER — Ambulatory Visit: Payer: Self-pay | Admitting: Student

## 2023-11-06 ENCOUNTER — Other Ambulatory Visit: Payer: Self-pay

## 2023-11-06 ENCOUNTER — Ambulatory Visit: Payer: Self-pay

## 2023-11-06 VITALS — BP 131/63 | HR 74 | Temp 98.1°F | Ht 64.5 in | Wt 187.2 lb

## 2023-11-06 DIAGNOSIS — M170 Bilateral primary osteoarthritis of knee: Secondary | ICD-10-CM

## 2023-11-06 DIAGNOSIS — E7800 Pure hypercholesterolemia, unspecified: Secondary | ICD-10-CM | POA: Diagnosis not present

## 2023-11-06 DIAGNOSIS — Z0000 Encounter for general adult medical examination without abnormal findings: Secondary | ICD-10-CM

## 2023-11-06 DIAGNOSIS — I10 Essential (primary) hypertension: Secondary | ICD-10-CM | POA: Diagnosis not present

## 2023-11-06 MED ORDER — SPIRONOLACTONE 50 MG PO TABS: 50.0000 mg | ORAL_TABLET | Freq: Every day | ORAL | 11 refills | Status: AC

## 2023-11-06 MED ORDER — MELOXICAM 15 MG PO TABS: 15.0000 mg | ORAL_TABLET | Freq: Every day | ORAL | 3 refills | Status: DC

## 2023-11-06 MED ORDER — DICLOFENAC SODIUM 1 % EX GEL: 4.0000 g | Freq: Four times a day (QID) | CUTANEOUS | 1 refills | Status: AC

## 2023-11-06 MED ORDER — HYDROCHLOROTHIAZIDE 25 MG PO TABS
25.0000 mg | ORAL_TABLET | Freq: Every day | ORAL | 3 refills | Status: AC
Start: 2023-11-06 — End: ?

## 2023-11-06 MED ORDER — ROSUVASTATIN CALCIUM 20 MG PO TABS: 40.0000 mg | ORAL_TABLET | Freq: Every day | ORAL | 11 refills | Status: DC

## 2023-11-06 MED ORDER — EZETIMIBE 10 MG PO TABS: 10.0000 mg | ORAL_TABLET | Freq: Every day | ORAL | 0 refills | Status: DC

## 2023-11-06 NOTE — Assessment & Plan Note (Addendum)
 Patient had been on amlodipine  in the past, discontinued due to swelling.  Patient not swollen on exam and states she has not experienced this swelling in years.  Patient currently taking HCTZ 25 mg daily and spironolactone  50 mg daily.  BP was 155/60 initially, on recheck BP was 131/63.  Patient checks her blood pressures regularly at home and states they are usually in the 130s over 80 to 90s.  States that systolics are in the 150s 2-3 times a month.  Will plan to continue on current regimen and will recheck at follow-up in 6 months.  Discussed sugar and salt in diet and impact on blood pressure.  Encouraged patient to bring her medications with her when she comes to her visits and to continue to monitor her blood pressures.  Orders:   hydrochlorothiazide  (HYDRODIURIL ) 25 MG tablet; Take 1 tablet (25 mg total) by mouth daily.   spironolactone  (ALDACTONE ) 50 MG tablet; Take 1 tablet (50 mg total) by mouth daily.

## 2023-11-06 NOTE — Assessment & Plan Note (Addendum)
 Patient last LDL in April of this year was 113.  This is above goal.  Last visit in April, patient was switched from atorvastatin  80 mg daily to rosuvastatin  40 mg daily.  Patient did not pick up the rosuvastatin  and has not been taking atorvastatin  in the interim.  We discussed the medication change in detail during this visit, patient understands that she will start a new cholesterol medicine called rosuvastatin  and that she will take 2 pills daily to equal 40 mg and that it will be mailed to her.  Will continue ezetimibe  10 mg daily.  Patient understands and agreeable.  Plan to follow-up lipid panel in 6 months.  Orders:   ezetimibe  (ZETIA ) 10 MG tablet; Take 1 tablet (10 mg total) by mouth daily.   rosuvastatin  (CRESTOR ) 20 MG tablet; Take 2 tablets (40 mg total) by mouth daily.

## 2023-11-06 NOTE — Progress Notes (Addendum)
 Established Patient Office Visit  Subjective   Patient ID: Victoria Campos, female    DOB: [birth year]  Age: 73 y.o. MRN: [id number]  Spent time figuring out which medications patient was taking.  There was some confusion about which medications were stopped and started at the last visit.  Patient had photo of the 3 medications that she is currently taking at home which include ezetimibe , HCTZ, and spironolactone .  Patient was taking atorvastatin  but was told to stop taking that medicine at the last visit and that she would be started on another medicine.  Patient did not pick up the new Medicine so she was only taking the previously mentioned 3 medicines.   Hypertension        Objective:     BP 131/63 (BP Location: Right Arm, Cuff Size: Large)   Pulse 74   Temp 98.1 F (36.7 C) (Oral)   Ht 5' 4.5 (1.638 m)   Wt 187 lb 3.2 oz (84.9 kg)   SpO2 97%   BMI 31.64 kg/m    Physical Exam Vitals reviewed.  Constitutional:      Appearance: Normal appearance.  HENT:     Head: Normocephalic and atraumatic.     Right Ear: There is impacted cerumen.     Left Ear: There is impacted cerumen.     Nose: Nose normal.     Mouth/Throat:     Mouth: Mucous membranes are moist.     Pharynx: Oropharynx is clear.  Eyes:     Conjunctiva/sclera: Conjunctivae normal.  Neck:     Comments: Negative for thyroid  nodules Cardiovascular:     Rate and Rhythm: Normal rate and regular rhythm.     Comments: Aortic systolic murmur Pulmonary:     Breath sounds: Normal breath sounds.  Abdominal:     General: Bowel sounds are normal.     Palpations: Abdomen is soft.     Tenderness: There is no abdominal tenderness.  Musculoskeletal:     Right lower leg: No edema.     Left lower leg: No edema.  Skin:    General: Skin is warm and dry.  Neurological:     General: No focal deficit present.     Mental Status: She is alert and oriented to person, place, and time.  Psychiatric:        Mood and Affect:  Mood normal.        Behavior: Behavior normal.      No results found for any visits on 11/06/23.    The 10-year ASCVD risk score (Arnett DK, et al., 2019) is: 15.7%    Assessment & Plan:   Assessment & Plan Essential hypertension Patient had been on amlodipine  in the past, discontinued due to swelling.  Patient not swollen on exam and states she has not experienced this swelling in years.  Patient currently taking HCTZ 25 mg daily and spironolactone  50 mg daily.  BP was 155/60 initially, on recheck BP was 131/63.  Patient checks her blood pressures regularly at home and states they are usually in the 130s over 80 to 90s.  States that systolics are in the 150s 2-3 times a month.  Will plan to continue on current regimen and will recheck at follow-up in 6 months.  Discussed sugar and salt in diet and impact on blood pressure.  Encouraged patient to bring her medications with her when she comes to her visits and to continue to monitor her blood pressures.  Orders:   hydrochlorothiazide  (HYDRODIURIL )  25 MG tablet; Take 1 tablet (25 mg total) by mouth daily.   spironolactone  (ALDACTONE ) 50 MG tablet; Take 1 tablet (50 mg total) by mouth daily.  Pure hypercholesterolemia Patient last LDL in April of this year was 113.  This is above goal.  Last visit in April, patient was switched from atorvastatin  80 mg daily to rosuvastatin  40 mg daily.  Patient did not pick up the rosuvastatin  and has not been taking atorvastatin  in the interim.  We discussed the medication change in detail during this visit, patient understands that she will start a new cholesterol medicine called rosuvastatin  and that she will take 2 pills daily to equal 40 mg and that it will be mailed to her.  Will continue ezetimibe  10 mg daily.  Patient understands and agreeable.  Plan to follow-up lipid panel in 6 months.  Orders:   ezetimibe  (ZETIA ) 10 MG tablet; Take 1 tablet (10 mg total) by mouth daily.   rosuvastatin  (CRESTOR ) 20  MG tablet; Take 2 tablets (40 mg total) by mouth daily.  Primary osteoarthritis of both knees Osteoarthritis is not worse but it is still present.  Patient needs more Voltaren  gel.  Meloxicam  helps her, she uses it for up to 3 consecutive days and only when she is having bad pain.  After taking for 3 days, this treats her pain.  Then she will usually go 7 to 8 days without taking it.  Discussed that this is the correct way to use NSAIDs and that if she begins to require meloxicam  more frequently, let us  know.  Discussed that this is not a medication to take daily for extended periods of time.  Discussed that maintaining a healthy weight and continuing physical activity will help her osteoarthritis pain and minimize progression.  Patient understands and agreeable.  Continue Voltaren  gel and meloxicam  15 mg as needed.  Orders:   diclofenac  Sodium (VOLTAREN ) 1 % GEL; Apply 4 g topically 4 (four) times daily.   meloxicam  (MOBIC ) 15 MG tablet; Take 1 tablet (15 mg total) by mouth daily.  Healthcare maintenance Patient had bilateral cerumen impaction. Disimpacted today during encounter. Patient immediately felt as though she could hear better.  Patient did NOT receive her tetanus booster at this visit. Will need at her next fu in 6 months.      Return in about 6 months (around 05/08/2024) for Blood pressure check and lipid panel and kidney function labs .    [name], DO

## 2023-11-06 NOTE — Assessment & Plan Note (Signed)
 Patient had bilateral cerumen impaction. Disimpacted today during encounter. Patient immediately felt as though she could hear better.  Patient did NOT receive her tetanus booster at this visit. Will need at her next fu in 6 months.

## 2023-11-06 NOTE — Assessment & Plan Note (Addendum)
 Osteoarthritis is not worse but it is still present.  Patient needs more Voltaren  gel.  Meloxicam  helps her, she uses it for up to 3 consecutive days and only when she is having bad pain.  After taking for 3 days, this treats her pain.  Then she will usually go 7 to 8 days without taking it.  Discussed that this is the correct way to use NSAIDs and that if she begins to require meloxicam  more frequently, let us  know.  Discussed that this is not a medication to take daily for extended periods of time.  Discussed that maintaining a healthy weight and continuing physical activity will help her osteoarthritis pain and minimize progression.  Patient understands and agreeable.  Continue Voltaren  gel and meloxicam  15 mg as needed.  Orders:   diclofenac  Sodium (VOLTAREN ) 1 % GEL; Apply 4 g topically 4 (four) times daily.   meloxicam  (MOBIC ) 15 MG tablet; Take 1 tablet (15 mg total) by mouth daily.

## 2023-11-06 NOTE — Patient Instructions (Signed)
 It was wonderful seeing you today!   Please remember to...  1) continue taking ezetimibe  10 mg daily for your cholesterol  2) continue taking hydrochlorothiazide  25 mg daily for your blood pressure  3) continue taking spironolactone  50 mg daily for your blood pressure  4) start taking rosuvastatin  40 mg (2 tablets) for your cholesterol.  This will replace your atorvastatin .  5) continue applying Voltaren  gel and continue taking meloxicam  15 mg 1 tablet as needed for your osteoarthritis.  If you begin to need meloxicam  more frequently, please let us  know because this medication should not be taken everyday for extended periods of time.   And don't forget to get your flu shot before the end of October!  If you have any questions please feel free to the call the clinic at anytime at [phone].  Have a blessed day,  Dr. Charmayne

## 2023-11-15 NOTE — Progress Notes (Signed)
 Internal Medicine Clinic Attending  Case discussed with the resident at the time of the visit.  We reviewed the resident's history and exam and pertinent patient test results.  I agree with the assessment, diagnosis, and plan of care documented in the resident's note.

## 2023-12-30 ENCOUNTER — Telehealth: Payer: Self-pay | Admitting: *Deleted

## 2023-12-30 DIAGNOSIS — E7800 Pure hypercholesterolemia, unspecified: Secondary | ICD-10-CM

## 2023-12-30 MED ORDER — EZETIMIBE 10 MG PO TABS: 10.0000 mg | ORAL_TABLET | Freq: Every day | ORAL | 1 refills | Status: DC

## 2023-12-30 NOTE — Telephone Encounter (Signed)
 Med refill

## 2024-01-13 ENCOUNTER — Encounter: Payer: Self-pay | Admitting: Radiology

## 2024-03-22 ENCOUNTER — Other Ambulatory Visit: Payer: Self-pay

## 2024-03-22 DIAGNOSIS — M170 Bilateral primary osteoarthritis of knee: Secondary | ICD-10-CM

## 2024-03-22 DIAGNOSIS — E7800 Pure hypercholesterolemia, unspecified: Secondary | ICD-10-CM

## 2024-03-26 ENCOUNTER — Other Ambulatory Visit: Payer: Self-pay

## 2024-03-26 DIAGNOSIS — E7800 Pure hypercholesterolemia, unspecified: Secondary | ICD-10-CM

## 2024-04-01 ENCOUNTER — Other Ambulatory Visit: Payer: Self-pay

## 2024-04-01 DIAGNOSIS — E7800 Pure hypercholesterolemia, unspecified: Secondary | ICD-10-CM

## 2024-05-08 ENCOUNTER — Ambulatory Visit: Payer: Self-pay
